# Patient Record
Sex: Female | Born: 1945
Health system: Southern US, Community
[De-identification: ages and names within clinical notes are randomized; demographics above are authoritative.]

## PROBLEM LIST (undated history)

## (undated) DIAGNOSIS — K219 Gastro-esophageal reflux disease without esophagitis: Secondary | ICD-10-CM

## (undated) DIAGNOSIS — J189 Pneumonia, unspecified organism: Secondary | ICD-10-CM

## (undated) DIAGNOSIS — T8859XA Other complications of anesthesia, initial encounter: Secondary | ICD-10-CM

## (undated) DIAGNOSIS — R0600 Dyspnea, unspecified: Secondary | ICD-10-CM

## (undated) DIAGNOSIS — R112 Nausea with vomiting, unspecified: Secondary | ICD-10-CM

## (undated) DIAGNOSIS — T4145XA Adverse effect of unspecified anesthetic, initial encounter: Secondary | ICD-10-CM

## (undated) DIAGNOSIS — F3289 Other specified depressive episodes: Secondary | ICD-10-CM

## (undated) DIAGNOSIS — J309 Allergic rhinitis, unspecified: Secondary | ICD-10-CM

## (undated) DIAGNOSIS — J449 Chronic obstructive pulmonary disease, unspecified: Secondary | ICD-10-CM

## (undated) DIAGNOSIS — M1990 Unspecified osteoarthritis, unspecified site: Secondary | ICD-10-CM

## (undated) DIAGNOSIS — I728 Aneurysm of other specified arteries: Secondary | ICD-10-CM

## (undated) DIAGNOSIS — F329 Major depressive disorder, single episode, unspecified: Secondary | ICD-10-CM

## (undated) DIAGNOSIS — I10 Essential (primary) hypertension: Secondary | ICD-10-CM

## (undated) DIAGNOSIS — E669 Obesity, unspecified: Secondary | ICD-10-CM

## (undated) DIAGNOSIS — F419 Anxiety disorder, unspecified: Secondary | ICD-10-CM

## (undated) DIAGNOSIS — Z8719 Personal history of other diseases of the digestive system: Secondary | ICD-10-CM

## (undated) DIAGNOSIS — Z9889 Other specified postprocedural states: Secondary | ICD-10-CM

## (undated) DIAGNOSIS — K5190 Ulcerative colitis, unspecified, without complications: Secondary | ICD-10-CM

## (undated) DIAGNOSIS — J45909 Unspecified asthma, uncomplicated: Secondary | ICD-10-CM

## (undated) HISTORY — DX: Major depressive disorder, single episode, unspecified: F32.9

## (undated) HISTORY — DX: Essential (primary) hypertension: I10

## (undated) HISTORY — DX: Other specified depressive episodes: F32.89

## (undated) HISTORY — DX: Gastro-esophageal reflux disease without esophagitis: K21.9

## (undated) HISTORY — DX: Aneurysm of other specified arteries: I72.8

## (undated) HISTORY — DX: Allergic rhinitis, unspecified: J30.9

## (undated) HISTORY — DX: Unspecified osteoarthritis, unspecified site: M19.90

## (undated) HISTORY — DX: Obesity, unspecified: E66.9

## (undated) HISTORY — PX: HERNIA REPAIR: SHX51

## (undated) HISTORY — PX: CATARACT EXTRACTION W/ INTRAOCULAR LENS IMPLANT: SHX1309

---
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
## 1978-01-19 HISTORY — PX: ABDOMINAL HYSTERECTOMY: SHX81

## 1983-01-20 HISTORY — PX: NASAL SEPTUM SURGERY: SHX37

## 1997-12-24 ENCOUNTER — Encounter: Payer: Self-pay | Admitting: Otolaryngology

## 1997-12-26 ENCOUNTER — Ambulatory Visit (HOSPITAL_COMMUNITY): Admission: RE | Admit: 1997-12-26 | Discharge: 1997-12-26 | Payer: Self-pay | Admitting: Otolaryngology

## 1999-05-28 ENCOUNTER — Encounter: Admission: RE | Admit: 1999-05-28 | Discharge: 1999-05-28 | Payer: Self-pay | Admitting: Internal Medicine

## 1999-05-28 ENCOUNTER — Encounter: Payer: Self-pay | Admitting: Internal Medicine

## 1999-08-22 ENCOUNTER — Encounter: Admission: RE | Admit: 1999-08-22 | Discharge: 1999-08-22 | Payer: Self-pay | Admitting: Obstetrics and Gynecology

## 1999-08-22 ENCOUNTER — Encounter: Payer: Self-pay | Admitting: Obstetrics and Gynecology

## 2000-01-20 HISTORY — PX: CHOLECYSTECTOMY: SHX55

## 2000-01-20 HISTORY — PX: LAPAROSCOPIC NISSEN FUNDOPLICATION: SHX1932

## 2000-04-28 ENCOUNTER — Encounter: Payer: Self-pay | Admitting: Internal Medicine

## 2000-04-28 ENCOUNTER — Encounter: Admission: RE | Admit: 2000-04-28 | Discharge: 2000-04-28 | Payer: Self-pay | Admitting: Internal Medicine

## 2000-07-26 ENCOUNTER — Ambulatory Visit (HOSPITAL_COMMUNITY): Admission: RE | Admit: 2000-07-26 | Discharge: 2000-07-26 | Payer: Self-pay | Admitting: Gastroenterology

## 2000-08-16 ENCOUNTER — Encounter: Admission: RE | Admit: 2000-08-16 | Discharge: 2000-08-16 | Payer: Self-pay | Admitting: Gastroenterology

## 2000-08-16 ENCOUNTER — Encounter: Payer: Self-pay | Admitting: Gastroenterology

## 2000-09-08 ENCOUNTER — Ambulatory Visit (HOSPITAL_COMMUNITY): Admission: RE | Admit: 2000-09-08 | Discharge: 2000-09-08 | Payer: Self-pay | Admitting: Gastroenterology

## 2000-09-22 ENCOUNTER — Ambulatory Visit (HOSPITAL_COMMUNITY): Admission: RE | Admit: 2000-09-22 | Discharge: 2000-09-22 | Payer: Self-pay | Admitting: Surgery

## 2000-09-22 ENCOUNTER — Encounter: Payer: Self-pay | Admitting: Surgery

## 2000-10-19 ENCOUNTER — Encounter: Payer: Self-pay | Admitting: Surgery

## 2000-10-22 ENCOUNTER — Encounter (INDEPENDENT_AMBULATORY_CARE_PROVIDER_SITE_OTHER): Payer: Self-pay | Admitting: *Deleted

## 2000-10-23 ENCOUNTER — Inpatient Hospital Stay (HOSPITAL_COMMUNITY): Admission: RE | Admit: 2000-10-23 | Discharge: 2000-10-24 | Payer: Self-pay | Admitting: Surgery

## 2001-01-19 HISTORY — PX: BREAST BIOPSY: SHX20

## 2001-02-11 ENCOUNTER — Ambulatory Visit (HOSPITAL_COMMUNITY): Admission: RE | Admit: 2001-02-11 | Discharge: 2001-02-11 | Payer: Self-pay | Admitting: Surgery

## 2001-02-11 ENCOUNTER — Encounter: Payer: Self-pay | Admitting: Surgery

## 2001-04-19 ENCOUNTER — Other Ambulatory Visit: Admission: RE | Admit: 2001-04-19 | Discharge: 2001-04-19 | Payer: Self-pay | Admitting: Obstetrics and Gynecology

## 2001-07-11 ENCOUNTER — Ambulatory Visit (HOSPITAL_COMMUNITY): Admission: RE | Admit: 2001-07-11 | Discharge: 2001-07-11 | Payer: Self-pay | Admitting: Surgery

## 2001-07-14 ENCOUNTER — Encounter: Payer: Self-pay | Admitting: Surgery

## 2001-07-14 ENCOUNTER — Ambulatory Visit (HOSPITAL_COMMUNITY): Admission: RE | Admit: 2001-07-14 | Discharge: 2001-07-14 | Payer: Self-pay

## 2002-06-07 ENCOUNTER — Other Ambulatory Visit: Admission: RE | Admit: 2002-06-07 | Discharge: 2002-06-07 | Payer: Self-pay | Admitting: Obstetrics and Gynecology

## 2003-05-23 ENCOUNTER — Encounter: Admission: RE | Admit: 2003-05-23 | Discharge: 2003-05-23 | Payer: Self-pay | Admitting: Internal Medicine

## 2003-05-23 ENCOUNTER — Encounter (INDEPENDENT_AMBULATORY_CARE_PROVIDER_SITE_OTHER): Payer: Self-pay | Admitting: *Deleted

## 2003-05-24 ENCOUNTER — Encounter: Admission: RE | Admit: 2003-05-24 | Discharge: 2003-05-24 | Payer: Self-pay | Admitting: Internal Medicine

## 2003-06-13 ENCOUNTER — Ambulatory Visit (HOSPITAL_COMMUNITY): Admission: RE | Admit: 2003-06-13 | Discharge: 2003-06-13 | Payer: Self-pay | Admitting: Internal Medicine

## 2003-08-16 ENCOUNTER — Other Ambulatory Visit: Admission: RE | Admit: 2003-08-16 | Discharge: 2003-08-16 | Payer: Self-pay | Admitting: Obstetrics and Gynecology

## 2003-12-24 ENCOUNTER — Encounter: Admission: RE | Admit: 2003-12-24 | Discharge: 2003-12-24 | Payer: Self-pay | Admitting: Internal Medicine

## 2004-06-06 ENCOUNTER — Encounter: Admission: RE | Admit: 2004-06-06 | Discharge: 2004-06-06 | Payer: Self-pay | Admitting: Internal Medicine

## 2004-12-04 ENCOUNTER — Other Ambulatory Visit: Admission: RE | Admit: 2004-12-04 | Discharge: 2004-12-04 | Payer: Self-pay | Admitting: Obstetrics and Gynecology

## 2005-02-24 ENCOUNTER — Other Ambulatory Visit: Admission: RE | Admit: 2005-02-24 | Discharge: 2005-02-24 | Payer: Self-pay | Admitting: Obstetrics and Gynecology

## 2005-06-26 ENCOUNTER — Encounter: Admission: RE | Admit: 2005-06-26 | Discharge: 2005-06-26 | Payer: Self-pay | Admitting: Internal Medicine

## 2006-08-12 ENCOUNTER — Encounter: Admission: RE | Admit: 2006-08-12 | Discharge: 2006-08-12 | Payer: Self-pay | Admitting: Internal Medicine

## 2007-08-15 ENCOUNTER — Encounter: Admission: RE | Admit: 2007-08-15 | Discharge: 2007-08-15 | Payer: Self-pay | Admitting: Internal Medicine

## 2008-08-22 ENCOUNTER — Encounter: Admission: RE | Admit: 2008-08-22 | Discharge: 2008-08-22 | Payer: Self-pay | Admitting: Internal Medicine

## 2009-09-12 ENCOUNTER — Encounter: Admission: RE | Admit: 2009-09-12 | Discharge: 2009-09-12 | Payer: Self-pay | Admitting: Internal Medicine

## 2010-01-19 HISTORY — PX: BREAST BIOPSY: SHX20

## 2010-02-08 ENCOUNTER — Encounter: Payer: Self-pay | Admitting: Obstetrics and Gynecology

## 2010-06-06 NOTE — Op Note (Signed)
[place]  Patient:    Tasha Hunt, Tasha Hunt                    MRN: [id number] Proc. Date: 07/26/00 Adm. Date:  [id number] Disc. Date: [id number] Attending:  Orland Mustard CC:         Gloris Manchester. Lazarus Salines, M.D.  Modesta Messing, M.D.  Thad Ranger, M.D   Operative Report  PROCEDURE:  A 24-hour pH probe.  INDICATIONS FOR PROCEDURE:  The patient is being considered for esophageal reflux.  She had a manometry done prior to the 24-hour probe.  The results of the 24 hour probe are as follows.  The patient had probe in place for 23 hours and 50 minutes.  The following results were obtained from the distal esophagus.  Percent time pH less than 48.5.  Total 10.4% upright and 6.8% recumbent. Total time pH less than 4.0, 122 minutes total, 72 minutes upright, and 50 minutes recumbent.  Reflux episode 152 total, 97 upright, 55 recumbent. Episodes greater than 5 minutes 2 in the recumbent position with the longest episode being 24 minutes.  Final composite score of the distal channel 46.5 minutes, normal less than 22.  ASSESSMENT:  Significant esophageal reflux.  PLAN:  Will have the patient come back to the office to discuss options. DD:  08/02/00 TD:  08/02/00 Job: [id number]

## 2010-06-06 NOTE — Procedures (Signed)
[place]  Patient:    Tasha Hunt, Tasha Hunt                    MRN: [id number] Proc. Date: 07/26/00 Adm. Date:  [id number] Disc. Date: [id number] Attending:  Orland Mustard CC:         Gloris Manchester. Lazarus Salines, M.D.  Modesta Messing, M.D.  Thad Ranger, M.D   Procedure Report  PROCEDURE:  Esophageal manometry.  INDICATIONS:  The procedure is done as part of a study for esophageal reflux, in anticipation of possible antireflux surgery.  DESCRIPTION OF PROCEDURE:  The procedure had been explained to the patient and consent obtained.  The procedure was performed in the [facility] in the usual manner.  No provocative studies were performed.  The results were as follows:  1. The upper esophageal sphincter relaxes with swallowing.  Pharyngeal spikes    are present. 2. Esophageal body mean amplitude -- 200 mmHg.  Induration 4.9 sec.  Normal    peristalsis and normal swallowing. 3. Lower esophageal sphincter mean pressure of 11 mmHg (right at lower    limits of normal); relaxes completely with swallowing.  Measured at 100%    relaxation by the computer.  ASSESSMENT:  Esophageal manometry with probable hypertensive LES.  PLAN:  Will proceed at this time with 24 hr pH probe. DD:  08/02/00 TD:  08/02/00 Job: [id number]

## 2010-06-06 NOTE — Op Note (Signed)
[facility]  Patient:    Tasha Hunt, Tasha Hunt Visit Number: [id number] MRN: [id number]          Service Type: SUR Location: 4W 0468 01 Attending Physician:  Katha Cabal Dictated by:   Thorn[place] Park Daphine Deutscher, M.D. Proc. Date: 10/22/00 Admit Date:  10/22/2000   CC:         Fayrene Fearing L. Randa Evens, M.D.  Modesta Messing, M.D.  Thad Ranger, M.D  Gloris Manchester. Lazarus Salines, M.D.   Operative Report  PREOPERATIVE DIAGNOSIS:  Gastroesophageal reflux disease and gallstones.  POSTOPERATIVE DIAGNOSIS:  Gastroesophageal reflux disease with moderate hiatal hernia and gallstones.  PROCEDURE:  Laparoscopic Nissen fundoplication and laparoscopic cholecystectomy, three suture wrap and three suture closure of hiatus.  SURGEON:  Thornton Park. Daphine Deutscher, M.D.  ASSISTANT:  Angelia Mould. Derrell Lolling, M.D.  ANESTHESIA:  General endotracheal.  DESCRIPTION OF PROCEDURE:  The patient is a 65 year old lady who was taken to room #1 at [facility] and given general anesthesia.  The abdomen was prepped with Betadine and draped sterilely.  I made a longitudinal incision above the umbilicus and entered the abdomen through a pursestring suture without difficulty, and insufflated the abdomen.  A 5 mm was placed in the upper midline.  Two 10/11s were placed in the right upper quadrant and one 10/11 in the left upper quadrant.  The liver was retracted with a 5 mm retractor.  First, I took down the gastrohepatic ligament and identified the right crus and opened that, and then mobilized the esophagus from the right side.  Next, I went over and took down the short gastrics.  I did look somewhat, and was on the look out for possible splenic artery aneurysm, but saw none.  I did have to take down numerous adhesions in the left upper quadrant from the greater curvature omentum stuck up to the anterior abdominal wall and these were taken down with the harmonic scalpel.  The short gastrics were then  taken down and the left crus was identified and mobilized, and the hiatal hernia was reduced into the abdomen.  A Penrose drain was placed behind the esophagus and used for traction to further complete the dissection of the esophagogastric junction and the hiatus.  With that in place, I then closed the hiatus using three sutures and the Endostitch.  Extracorporeal ties were placed and this snugged down the esophagus nicely.  A #50 lighted dilator was then passed easily into the stomach.  I reached around behind the esophagus and grasped a portion of the fundus where one of the short gastrics was and brought this around and a contiguous portion was used to wrap anteriorly.  Shoe shine maneuver demonstrated this continuity.  The wrap was then sutured using the Endostitch and intracorporeal ties, taking good purchases of the fundus, the esophagus, and the wrapped stomach on all three sutures.  Clips were placed on all the sutures. A large short gastric vessel which had been adequately coagulated with the harmonic was also clipped just because it was sitting up prominently on the wrapped portion of the stomach.  There was no evidence of any injury to the lumen of the esophagus or stomach.  The area was irrigated with saline and a picture taken which was placed on the chart.  The bougie was withdrawn.  There was no evidence of bleeding from the spleen.  Next, we turned our attention to the gallbladder.  The gallbladder was grasped and elevated.  She had numerous adhesions to the infundibulum  and neck, and I dissected free down Calots node to identify the gallbladder as it formed the cystic duct.  I stripped away adhesions and put a clip up on the gallbladder, and opened the cystic duct, and milked out some small black stones.  A good flow of bile flowed back at that point, and I triple clipped the cystic duct, divided it, triple clipped the cystic artery, and divided it, and then  the removed the gallbladder from the gallbladder bed without difficulty.  The gallbladder was placed in a bag and brought out through the umbilicus.  I then went ahead and irrigated and aspirated, and there was no bleeding or bile leak noted.  The trocar sites were injected with 0.5% Marcaine and the umbilical port was tied down with a pursestring suture.  Other port sites were withdrawn.  They had been placed obliquely and this seemed to close these defects spontaneously.  The wounds were closed with 4-0 Vicryl, Benzoin, and Steri-Strips.  The patient seemed to tolerate the procedure well and was taken to the recovery room in satisfactory condition. Dictated by:   Thornton Park Daphine Deutscher, M.D. Attending Physician:  Katha Cabal DD:  10/22/00 TD:  10/23/00 Job: [phone]

## 2010-06-06 NOTE — Procedures (Signed)
[facility]  Patient:    Tasha Hunt, Tasha Hunt Visit Number: [id number] MRN: [id number]          Service Type: END Location: ENDO Attending Physician:  Orland Mustard Dictated by:   Llana Aliment. Randa Evens, M.D. Proc. Date: 09/08/00 Adm. Date:  09/08/2000   CC:         Zola Button T. Lazarus Salines, M.D.  Modesta Messing, M.D.  Hoyle Barr, M.D.  Thornton Park Daphine Deutscher, M.D.   Procedure Report  PROCEDURE:  Esophagogastroduodenoscopy.  MEDICATIONS:  Hurricaine spray, Fentanyl 75 mcg, Versed 6 mg IV.  INDICATIONS FOR PROCEDURE:  Significant esophageal reflux. This is done in anticipation of possible antireflux operation.  DESCRIPTION OF PROCEDURE:  The procedure had been explained to the patient and consent obtained. With the patient in the left lateral decubitus position, the Olympus video endoscope was inserted into the esophagus with agglutination and advanced under direct visualization. The stomach was entered, pylorus identified and passed. The duodenum including the bulb and second portion was unremarkable. The scope was withdrawn back in the stomach. The antrum and body were normal. The fundus and cardia were seen well on the retroflexed view. The scope was withdrawn. The patient had a 3-4 cm hiatal hernia with a widely patulous GE junction with possibly a ring at the junction of the esophagus and stomach but widely patent. The distal esophagus was somewhat reddened consistent with reflux. The scope was withdrawn, the proximal esophagus was normal as was the hypopharynx and vocal cords. The scope was withdrawn. The patient tolerated the procedure well and was maintained on low flow oxygen and pulse oximeter throughout the procedure.  ASSESSMENT:  Hiatal hernia with esophageal ring and reflux.  PLAN:  The patient will see Dr. Daphine Deutscher for consideration for an antireflux operation. Dictated by:   Llana Aliment. Randa Evens, M.D. Attending Physician:  Orland Mustard DD:  09/08/00 TD:  09/08/00 Job: [id number]

## 2010-08-07 ENCOUNTER — Other Ambulatory Visit: Payer: Self-pay | Admitting: Internal Medicine

## 2010-08-07 DIAGNOSIS — Z1231 Encounter for screening mammogram for malignant neoplasm of breast: Secondary | ICD-10-CM

## 2010-09-29 ENCOUNTER — Ambulatory Visit
Admission: RE | Admit: 2010-09-29 | Discharge: 2010-09-29 | Disposition: A | Discharge status: Home / Self Care | Payer: Medicare Other | Source: Ambulatory Visit | Attending: Internal Medicine | Admitting: Internal Medicine

## 2010-09-29 ENCOUNTER — Other Ambulatory Visit: Payer: Self-pay | Admitting: Internal Medicine

## 2010-09-29 DIAGNOSIS — Z1231 Encounter for screening mammogram for malignant neoplasm of breast: Secondary | ICD-10-CM

## 2010-09-30 ENCOUNTER — Other Ambulatory Visit: Payer: Self-pay | Admitting: Internal Medicine

## 2010-09-30 DIAGNOSIS — R928 Other abnormal and inconclusive findings on diagnostic imaging of breast: Secondary | ICD-10-CM

## 2010-10-06 ENCOUNTER — Ambulatory Visit
Admission: RE | Admit: 2010-10-06 | Discharge: 2010-10-06 | Disposition: A | Discharge status: Home / Self Care | Payer: Medicare Other | Source: Ambulatory Visit | Attending: Internal Medicine | Admitting: Internal Medicine

## 2010-10-06 ENCOUNTER — Other Ambulatory Visit: Payer: Self-pay | Admitting: Internal Medicine

## 2010-10-06 DIAGNOSIS — R928 Other abnormal and inconclusive findings on diagnostic imaging of breast: Secondary | ICD-10-CM

## 2010-10-06 DIAGNOSIS — N6002 Solitary cyst of left breast: Secondary | ICD-10-CM

## 2010-10-10 ENCOUNTER — Ambulatory Visit
Admission: RE | Admit: 2010-10-10 | Discharge: 2010-10-10 | Disposition: A | Discharge status: Home / Self Care | Payer: Medicare Other | Source: Ambulatory Visit | Attending: Internal Medicine | Admitting: Internal Medicine

## 2010-10-10 ENCOUNTER — Other Ambulatory Visit: Payer: Self-pay | Admitting: Internal Medicine

## 2010-10-10 DIAGNOSIS — N6320 Unspecified lump in the left breast, unspecified quadrant: Secondary | ICD-10-CM

## 2010-10-10 DIAGNOSIS — N6002 Solitary cyst of left breast: Secondary | ICD-10-CM

## 2011-02-18 DIAGNOSIS — K219 Gastro-esophageal reflux disease without esophagitis: Secondary | ICD-10-CM | POA: Diagnosis not present

## 2011-02-18 DIAGNOSIS — I728 Aneurysm of other specified arteries: Secondary | ICD-10-CM | POA: Diagnosis not present

## 2011-02-18 DIAGNOSIS — I10 Essential (primary) hypertension: Secondary | ICD-10-CM | POA: Diagnosis not present

## 2011-02-18 DIAGNOSIS — R1013 Epigastric pain: Secondary | ICD-10-CM | POA: Diagnosis not present

## 2011-02-23 DIAGNOSIS — I728 Aneurysm of other specified arteries: Secondary | ICD-10-CM | POA: Diagnosis not present

## 2011-03-11 DIAGNOSIS — K219 Gastro-esophageal reflux disease without esophagitis: Secondary | ICD-10-CM | POA: Diagnosis not present

## 2011-03-11 DIAGNOSIS — I728 Aneurysm of other specified arteries: Secondary | ICD-10-CM | POA: Diagnosis not present

## 2011-03-11 DIAGNOSIS — I10 Essential (primary) hypertension: Secondary | ICD-10-CM | POA: Diagnosis not present

## 2011-03-11 DIAGNOSIS — F329 Major depressive disorder, single episode, unspecified: Secondary | ICD-10-CM | POA: Diagnosis not present

## 2011-03-11 DIAGNOSIS — M1990 Unspecified osteoarthritis, unspecified site: Secondary | ICD-10-CM | POA: Diagnosis not present

## 2011-03-18 ENCOUNTER — Encounter: Payer: Self-pay | Admitting: Vascular Surgery

## 2011-03-19 ENCOUNTER — Ambulatory Visit (INDEPENDENT_AMBULATORY_CARE_PROVIDER_SITE_OTHER): Payer: Medicare Other | Admitting: Vascular Surgery

## 2011-03-19 ENCOUNTER — Encounter (HOSPITAL_COMMUNITY): Payer: Self-pay

## 2011-03-19 ENCOUNTER — Encounter: Payer: Self-pay | Admitting: Vascular Surgery

## 2011-03-19 ENCOUNTER — Encounter: Payer: Self-pay | Admitting: *Deleted

## 2011-03-19 ENCOUNTER — Other Ambulatory Visit: Payer: Self-pay | Admitting: *Deleted

## 2011-03-19 ENCOUNTER — Other Ambulatory Visit: Payer: Medicare Other

## 2011-03-19 VITALS — BP 126/72 | HR 75 | Resp 16 | Ht 63.0 in | Wt 184.4 lb

## 2011-03-19 DIAGNOSIS — I728 Aneurysm of other specified arteries: Secondary | ICD-10-CM | POA: Diagnosis not present

## 2011-03-19 NOTE — Progress Notes (Signed)
[facility] [place] HISTORY AND PHYSICAL   History of Present Illness:  Patient is a 65 y.o. year old female who presents for evaluation of a splenic artery aneurysm. The aneurysm was found in 2003. She was originally found on evaluation for gallstones. The aneurysm was 2.3 x 1.6 x 1.6 cm at that time. The patient denies any abdominal or left upper quadrant pain recently. She has had a previous Nissen fundoplication and cholecystectomy..  Other medical problems include hypertension, reflux, arthritis.  These are currently controlled and followed by her primary care physician. She does have family history of abdominal aortic aneurysm. .   Past Medical History  Diagnosis Date  . Allergic rhinitis, cause unspecified   . Hypertension   . Esophageal reflux   . Obesity, unspecified   . Depressive disorder, not elsewhere classified   . Aneurysm of splenic artery     noted since 2002  . Arthritis     hands  . Diaphragmatic hernia without mention of obstruction or gangrene     Past Surgical History  Procedure Date  . Cholecystectomy 2002  . Abdominal hysterectomy   . Hiatal hernia repair     laparoscopic repair  . Nasal septum surgery 1985    deviated septum     Social History History  Substance Use Topics  . Smoking status: Never Smoker   . Smokeless tobacco: Not on file  . Alcohol Use: No    Family History Family History  Problem Relation Age of Onset  . Cancer Mother     melanoma  . Diabetes Mother   . Heart disease Mother   . Hyperlipidemia Mother   . Diabetes Father   . Hypertension Father   . Cancer Father     prostate  . Heart disease Father   . Heart attack Father   . Cancer Paternal Aunt   . Aneurysm Paternal Aunt     AAA  . Cancer Paternal Grandmother   . Aneurysm Paternal Uncle     AAA    Allergies  No Known Allergies   Current Outpatient Prescriptions  Medication Sig Dispense Refill  . ALPRAZolam (XANAX) 0.25 MG tablet Take  0.5 mg by mouth every 4 (four) hours as needed. For anxiety      . BIOTIN PO Take 1 capsule by mouth daily.       . calcium carbonate (OS-CAL) 600 MG TABS Take 600 mg by mouth 2 (two) times daily with a meal.      . carvedilol (COREG) 12.5 MG tablet Take 12.5 mg by mouth 2 (two) times daily with a meal.      . cetirizine (ZYRTEC) 10 MG tablet Take 10 mg by mouth daily.      . Cholecalciferol (VITAMIN D3) 2000 UNITS TABS Take 1 capsule by mouth daily.       . diltiazem (CARDIZEM) 90 MG tablet Take 90 mg by mouth 2 (two) times daily.      . fish oil-omega-3 fatty acids 1000 MG capsule Take 1 g by mouth daily.      . fluticasone (FLONASE) 50 MCG/ACT nasal spray Place 2 sprays into the nose daily.      . Glucosamine-MSM-Hyaluronic Acd 750-375-30 MG TABS Take 3 capsules by mouth daily.       . Naproxen-Esomeprazole (VIMOVO) 500-20 MG TBEC Take 1 tablet by mouth 2 (two) times daily as needed. For osteoarthritis      . pantoprazole (PROTONIX) 40 MG tablet Take 40 mg   by mouth daily.      . FLUoxetine (PROZAC) 10 MG capsule Take 10 mg by mouth daily.        ROS:   General:  No weight loss, Fever, chills  HEENT: No recent headaches, no nasal bleeding, no visual changes, no sore throat  Neurologic: No dizziness, blackouts, seizures. No recent symptoms of stroke or mini- stroke. No recent episodes of slurred speech, or temporary blindness.  Cardiac: No recent episodes of chest pain/pressure, no shortness of breath at rest.  No shortness of breath with exertion.  Denies history of atrial fibrillation or irregular heartbeat  Vascular: No history of rest pain in feet.  No history of claudication.  No history of non-healing ulcer, No history of DVT   Pulmonary: No home oxygen, no productive cough, no hemoptysis,  No asthma or wheezing  Musculoskeletal:  [ ] Arthritis, [ ] Low back pain,  [ ] Joint pain  Hematologic:No history of hypercoagulable state.  No history of easy bleeding.  No history of  anemia  Gastrointestinal: No hematochezia or melena,  No gastroesophageal reflux, no trouble swallowing  Urinary: [ ] chronic Kidney disease, [ ] on HD - [ ] MWF or [ ] TTHS, [ ] Burning with urination, [ ] Frequent urination, [ ] Difficulty urinating;   Skin: No rashes  Psychological: No history of anxiety,  No history of depression   Physical Examination  Filed Vitals:   03/19/11 1212  BP: 126/72  Pulse: 75  Resp: 16  Height: 5' 3" (1.6 m)  Weight: 184 lb 6.4 oz (83.643 kg)    Body mass index is 32.66 kg/(m^2).  General:  Alert and oriented, no acute distress HEENT: Normal Neck: No bruit or JVD Pulmonary: Clear to auscultation bilaterally Cardiac: Regular Rate and Rhythm without murmur Abdomen: Soft, non-tender, non-distended, no mass Skin: No rash Extremity Pulses:  2+ radial, brachial, femoral, dorsalis pedis, posterior tibial pulses bilaterally Musculoskeletal: No deformity or edema  Neurologic: Upper and lower extremity motor 5/5 and symmetric  DATA: I reviewed the images from her CT scan from [facility] [place]. The scan is dated 02/23/2011. This shows a calcified splenic artery aneurysm which measures 2.6 x 3.2 cm. There is no evidence of abdominal aortic aneurysm.   ASSESSMENT: Splenic artery aneurysm which has now grown to greater than 3 cm.   PLAN:  Arteriogram with possible coil embolization: [facility] 2013. Risks benefits possible complications and procedure details of the angiogram were explained the patient today as well as a coil embolization. Since she has previously had Nissen fundoplication she may not have short gastrics to give reasonable blood supply to the spleen. This may result in a splenic infarct at the time of embolization. However I believe an open operation would be difficult as well because of her previous upper abdominal surgery. Also the location of the aneurysm in open repair would put her at high risk for splenectomy.  Demareon Coldwell, MD [facility] [place] Office: [phone] Pager: [phone]  

## 2011-03-27 ENCOUNTER — Inpatient Hospital Stay (HOSPITAL_COMMUNITY)
Admission: RE | Admit: 2011-03-27 | Discharge: 2011-03-29 | DRG: 238 | Disposition: A | Discharge status: Home / Self Care | Payer: Medicare Other | Source: Ambulatory Visit | Attending: Vascular Surgery | Admitting: Vascular Surgery

## 2011-03-27 ENCOUNTER — Encounter (HOSPITAL_COMMUNITY)
Admission: RE | Disposition: A | Discharge status: Home / Self Care | Payer: Self-pay | Source: Ambulatory Visit | Attending: Vascular Surgery

## 2011-03-27 ENCOUNTER — Other Ambulatory Visit: Payer: Self-pay | Admitting: *Deleted

## 2011-03-27 ENCOUNTER — Other Ambulatory Visit: Payer: Self-pay

## 2011-03-27 ENCOUNTER — Encounter (HOSPITAL_COMMUNITY): Payer: Self-pay | Admitting: General Practice

## 2011-03-27 DIAGNOSIS — F329 Major depressive disorder, single episode, unspecified: Secondary | ICD-10-CM | POA: Diagnosis present

## 2011-03-27 DIAGNOSIS — Z833 Family history of diabetes mellitus: Secondary | ICD-10-CM

## 2011-03-27 DIAGNOSIS — I10 Essential (primary) hypertension: Secondary | ICD-10-CM | POA: Diagnosis not present

## 2011-03-27 DIAGNOSIS — Z808 Family history of malignant neoplasm of other organs or systems: Secondary | ICD-10-CM | POA: Diagnosis not present

## 2011-03-27 DIAGNOSIS — Z6832 Body mass index (BMI) 32.0-32.9, adult: Secondary | ICD-10-CM | POA: Diagnosis not present

## 2011-03-27 DIAGNOSIS — I728 Aneurysm of other specified arteries: Principal | ICD-10-CM | POA: Diagnosis present

## 2011-03-27 DIAGNOSIS — Z90710 Acquired absence of both cervix and uterus: Secondary | ICD-10-CM | POA: Diagnosis not present

## 2011-03-27 DIAGNOSIS — Z8249 Family history of ischemic heart disease and other diseases of the circulatory system: Secondary | ICD-10-CM | POA: Diagnosis not present

## 2011-03-27 DIAGNOSIS — F3289 Other specified depressive episodes: Secondary | ICD-10-CM | POA: Diagnosis present

## 2011-03-27 DIAGNOSIS — K219 Gastro-esophageal reflux disease without esophagitis: Secondary | ICD-10-CM | POA: Diagnosis present

## 2011-03-27 DIAGNOSIS — E669 Obesity, unspecified: Secondary | ICD-10-CM | POA: Diagnosis present

## 2011-03-27 HISTORY — DX: Personal history of other diseases of the digestive system: Z87.19

## 2011-03-27 HISTORY — PX: EMBOLIZATION: SHX5507

## 2011-03-27 HISTORY — PX: ARTERIAL ANEURYSM REPAIR: SHX556

## 2011-03-27 HISTORY — DX: Anxiety disorder, unspecified: F41.9

## 2011-03-27 HISTORY — DX: Pneumonia, unspecified organism: J18.9

## 2011-03-27 LAB — POCT I-STAT, CHEM 8
Calcium, Ion: 1.12 mmol/L (ref 1.12–1.32)
Chloride: 106 mEq/L (ref 96–112)
Glucose, Bld: 103 mg/dL — ABNORMAL HIGH (ref 70–99)
HCT: 35 % — ABNORMAL LOW (ref 36.0–46.0)
Hemoglobin: 11.9 g/dL — ABNORMAL LOW (ref 12.0–15.0)
TCO2: 34 mmol/L (ref 0–100)

## 2011-03-27 SURGERY — EMBOLIZATION
Anesthesia: LOCAL

## 2011-03-27 MED ORDER — FAMOTIDINE IN NACL 20-0.9 MG/50ML-% IV SOLN
20.0000 mg | Freq: Two times a day (BID) | INTRAVENOUS | Status: DC
  Administered 2011-03-27 – 2011-03-28 (×3): 20 mg via INTRAVENOUS
  Filled 2011-03-27 (×4): qty 50

## 2011-03-27 MED ORDER — SODIUM CHLORIDE 0.45 % IV SOLN
INTRAVENOUS | Status: DC
  Administered 2011-03-27: 11:00:00 via INTRAVENOUS

## 2011-03-27 MED ORDER — HEPARIN (PORCINE) IN NACL 2-0.9 UNIT/ML-% IJ SOLN
INTRAMUSCULAR | Status: AC
  Filled 2011-03-27: qty 1000

## 2011-03-27 MED ORDER — LIDOCAINE HCL (PF) 1 % IJ SOLN
INTRAMUSCULAR | Status: AC
  Filled 2011-03-27: qty 30

## 2011-03-27 MED ORDER — NAPROXEN-ESOMEPRAZOLE 500-20 MG PO TBEC: 1.0000 | DELAYED_RELEASE_TABLET | Freq: Two times a day (BID) | ORAL | Status: DC | PRN

## 2011-03-27 MED ORDER — SODIUM CHLORIDE 0.9 % IV SOLN
INTRAVENOUS | Status: DC
  Administered 2011-03-27 – 2011-03-29 (×2): via INTRAVENOUS

## 2011-03-27 MED ORDER — MIDAZOLAM HCL 2 MG/2ML IJ SOLN
INTRAMUSCULAR | Status: AC
  Filled 2011-03-27: qty 2

## 2011-03-27 MED ORDER — LORATADINE 10 MG PO TABS
10.0000 mg | ORAL_TABLET | Freq: Every day | ORAL | Status: DC
  Administered 2011-03-28 – 2011-03-29 (×2): 10 mg via ORAL
  Filled 2011-03-27 (×3): qty 1

## 2011-03-27 MED ORDER — OXYCODONE HCL 5 MG PO TABS
5.0000 mg | ORAL_TABLET | ORAL | Status: DC | PRN
  Administered 2011-03-27: 10 mg via ORAL
  Filled 2011-03-27: qty 2

## 2011-03-27 MED ORDER — ALPRAZOLAM 0.5 MG PO TABS
0.5000 mg | ORAL_TABLET | ORAL | Status: DC | PRN
  Administered 2011-03-28: 0.5 mg via ORAL
  Filled 2011-03-27: qty 1

## 2011-03-27 MED ORDER — GUAIFENESIN-DM 100-10 MG/5ML PO SYRP: 15.0000 mL | ORAL_SOLUTION | ORAL | Status: DC | PRN

## 2011-03-27 MED ORDER — HYDRALAZINE HCL 20 MG/ML IJ SOLN
10.0000 mg | INTRAMUSCULAR | Status: DC | PRN
  Filled 2011-03-27: qty 0.5

## 2011-03-27 MED ORDER — FLUOXETINE HCL 10 MG PO CAPS
10.0000 mg | ORAL_CAPSULE | Freq: Every day | ORAL | Status: DC
  Administered 2011-03-27 – 2011-03-29 (×3): 10 mg via ORAL
  Filled 2011-03-27 (×3): qty 1

## 2011-03-27 MED ORDER — CALCIUM CARBONATE 600 MG PO TABS
600.0000 mg | ORAL_TABLET | Freq: Two times a day (BID) | ORAL | Status: DC
  Filled 2011-03-27 (×2): qty 1

## 2011-03-27 MED ORDER — SODIUM CHLORIDE 0.9 % IV SOLN: 500.0000 mL | Freq: Once | INTRAVENOUS | Status: AC | PRN

## 2011-03-27 MED ORDER — PHENOL 1.4 % MT LIQD
1.0000 | OROMUCOSAL | Status: DC | PRN
  Filled 2011-03-27: qty 177

## 2011-03-27 MED ORDER — PANTOPRAZOLE SODIUM 40 MG PO TBEC
40.0000 mg | DELAYED_RELEASE_TABLET | Freq: Every day | ORAL | Status: DC
  Administered 2011-03-27 – 2011-03-29 (×3): 40 mg via ORAL
  Filled 2011-03-27 (×3): qty 1

## 2011-03-27 MED ORDER — DILTIAZEM HCL 90 MG PO TABS
90.0000 mg | ORAL_TABLET | Freq: Two times a day (BID) | ORAL | Status: DC
  Administered 2011-03-27 – 2011-03-29 (×4): 90 mg via ORAL
  Filled 2011-03-27 (×6): qty 1

## 2011-03-27 MED ORDER — METOPROLOL TARTRATE 1 MG/ML IV SOLN: 2.0000 mg | INTRAVENOUS | Status: DC | PRN

## 2011-03-27 MED ORDER — ACETAMINOPHEN 325 MG PO TABS: 325.0000 mg | ORAL_TABLET | ORAL | Status: DC | PRN

## 2011-03-27 MED ORDER — FENTANYL CITRATE 0.05 MG/ML IJ SOLN
INTRAMUSCULAR | Status: AC
  Filled 2011-03-27: qty 2

## 2011-03-27 MED ORDER — CALCIUM CARBONATE 1250 (500 CA) MG PO TABS
500.0000 mg | ORAL_TABLET | Freq: Two times a day (BID) | ORAL | Status: DC
  Administered 2011-03-27 – 2011-03-29 (×4): 500 mg via ORAL
  Filled 2011-03-27 (×7): qty 1

## 2011-03-27 MED ORDER — CARVEDILOL 12.5 MG PO TABS
12.5000 mg | ORAL_TABLET | Freq: Two times a day (BID) | ORAL | Status: DC
  Administered 2011-03-27 – 2011-03-29 (×4): 12.5 mg via ORAL
  Filled 2011-03-27 (×6): qty 1

## 2011-03-27 MED ORDER — DOCUSATE SODIUM 100 MG PO CAPS
100.0000 mg | ORAL_CAPSULE | Freq: Every day | ORAL | Status: DC
  Administered 2011-03-28 – 2011-03-29 (×2): 100 mg via ORAL
  Filled 2011-03-27 (×2): qty 1

## 2011-03-27 MED ORDER — MAGNESIUM SULFATE 40 MG/ML IJ SOLN
2.0000 g | Freq: Once | INTRAMUSCULAR | Status: AC | PRN
  Filled 2011-03-27: qty 50

## 2011-03-27 MED ORDER — ACETAMINOPHEN 650 MG RE SUPP: 325.0000 mg | RECTAL | Status: DC | PRN

## 2011-03-27 MED ORDER — MORPHINE SULFATE 2 MG/ML IJ SOLN
2.0000 mg | INTRAMUSCULAR | Status: DC | PRN
  Administered 2011-03-27: 2 mg via INTRAVENOUS
  Administered 2011-03-27 (×2): 4 mg via INTRAVENOUS
  Filled 2011-03-27 (×2): qty 2
  Filled 2011-03-27: qty 1

## 2011-03-27 MED ORDER — ONDANSETRON HCL 4 MG/2ML IJ SOLN
4.0000 mg | Freq: Four times a day (QID) | INTRAMUSCULAR | Status: DC | PRN
  Administered 2011-03-27 – 2011-03-28 (×3): 4 mg via INTRAVENOUS
  Filled 2011-03-27 (×4): qty 2

## 2011-03-27 MED ORDER — LABETALOL HCL 5 MG/ML IV SOLN
10.0000 mg | INTRAVENOUS | Status: DC | PRN
  Filled 2011-03-27: qty 4

## 2011-03-27 MED ORDER — POTASSIUM CHLORIDE CRYS ER 20 MEQ PO TBCR: 20.0000 meq | EXTENDED_RELEASE_TABLET | Freq: Once | ORAL | Status: AC | PRN

## 2011-03-27 NOTE — Op Note (Signed)
Procedure: Aortogram with coil embolization of splenic artery  Preoperative diagnosis: Splenic artery aneurysm  Postoperative diagnosis: Same  Anesthesia Local with IV sedation  Operative details: After obtaining informed consent, the patient was taken to the PV LAB. The patient was placed in supine position on the Angio table. Both groins were prepped and draped in usual sterile fashion. Local anesthesia was infiltrated over the right common femoral artery.  An introducer needle was used to cannulate the right common femoral artery and 035 Bentsen wire threaded into the abdominal aorta under fluoroscopic guidance. Next a 5 French sheath is placed over the guidewire in the right common femoral artery. A 5 French pigtail catheter was placed over the guidewire into the abdominal aorta and abdominal aortogram was obtained. The infrarenal abdominal aorta is patent. The left and right common internal and external iliac arteries are patent. The celiac, right and left renal and superior mesenteric arteries are patent.  This was confirmed on a lateral aortic view.  Next, a 5 Fr Motarjeme catheter was brought up on the field and the pigtail catheter exchanged over a wire for this.  The Motarjeme catheter was then used to selectively catheterize the celiac origin.  Selective injection of the celiac axis was performed to determine the celiac anatomy which had normal position of the common hepatic splenic and gastric artery. The splenic artery aneurysm was identified. The [facility] wire was advanced through the catheter distally into the splenic artery out towards the area aneurysm. The Motarjeme catheter was then removed over a guidewire and exchanged for a 4 French straight catheter. This was advanced over the guidewire up into the distal portion of the splenic artery. The [facility] wire was then exchanged for an 035 first core wire. This was then advanced without difficulty out into the splenic artery aneurysm. The 4  French catheter was then exchanged for a 5 French Navicross catheter.  I then proceeded to advance the versacore wire out past the splenic artery aneurysm into the more distal splenic artery. I then proceeded to place several Nester coils in the splenic artery distal to the aneurysm. During the course of this portion of the procedure the catheter slipped back slightly and one coil was inadvertently placed up into a splenic artery branch proximal to the aneurysm. After completely filling the artery distal to the aneurysm for several centimeters, a dimpled catheter back slightly and filled the entire aneurysm with several Adriana Simas and Nester coils. The catheter was then again pulled back slightly and several coils were deployed into the splenic artery proximal to the aneurysm. Contrast angiogram was performed in the splenic artery which showed essentially no flow distal to the aneurysm or into the aneurysm and no flow into the proximal artery feeding the aneurysm. The coil that had gone out into the additional splenic artery branch had occluded this branch distally but there was some proximal flow in the branch and some flow into the spleen itself. The Navicross catheter was then pulled back over a guidewire and the Motarjeme a catheter again placed over the guidewire to selectively catheterize the celiac artery. Completion arteriogram was obtained in the AP and lateral projection which showed the remainder of the celiac axis and intact with some late filling of the spleen. There was no flow into the splenic artery aneurysm at this point. The catheter and guidewire were then removed. The 5 French sheath was removed and hemostasis obtained with direct pressure.  The patient tolerated the procedure well and there were no  complications except for the deployment of this one coil into a splenic artery branch with no obvious problems.   Patient was taken to the holding area in stable condition.  Operative  findings: Successful coil embolization of splenic artery aneurysm    Fabienne Bruns, MD [facility] [place] Office: [phone] Pager: [phone]

## 2011-03-27 NOTE — H&P (View-Only) (Signed)
[facility] [place] HISTORY AND PHYSICAL   History of Present Illness:  Patient is a 66 y.o. year old female who presents for evaluation of a splenic artery aneurysm. The aneurysm was found in 2003. She was originally found on evaluation for gallstones. The aneurysm was 2.3 x 1.6 x 1.6 cm at that time. The patient denies any abdominal or left upper quadrant pain recently. She has had a previous Nissen fundoplication and cholecystectomy..  Other medical problems include hypertension, reflux, arthritis.  These are currently controlled and followed by her primary care physician. She does have family history of abdominal aortic aneurysm. Marland Kitchen   Past Medical History  Diagnosis Date  . Allergic rhinitis, cause unspecified   . Hypertension   . Esophageal reflux   . Obesity, unspecified   . Depressive disorder, not elsewhere classified   . Aneurysm of splenic artery     noted since 2002  . Arthritis     hands  . Diaphragmatic hernia without mention of obstruction or gangrene     Past Surgical History  Procedure Date  . Cholecystectomy 2002  . Abdominal hysterectomy   . Hiatal hernia repair     laparoscopic repair  . Nasal septum surgery 1985    deviated septum     Social History History  Substance Use Topics  . Smoking status: Never Smoker   . Smokeless tobacco: Not on file  . Alcohol Use: No    Family History Family History  Problem Relation Age of Onset  . Cancer Mother     melanoma  . Diabetes Mother   . Heart disease Mother   . Hyperlipidemia Mother   . Diabetes Father   . Hypertension Father   . Cancer Father     prostate  . Heart disease Father   . Heart attack Father   . Cancer Paternal Aunt   . Aneurysm Paternal Aunt     AAA  . Cancer Paternal Grandmother   . Aneurysm Paternal Uncle     AAA    Allergies  No Known Allergies   Current Outpatient Prescriptions  Medication Sig Dispense Refill  . ALPRAZolam (XANAX) 0.25 MG tablet Take  0.5 mg by mouth every 4 (four) hours as needed. For anxiety      . BIOTIN PO Take 1 capsule by mouth daily.       . calcium carbonate (OS-CAL) 600 MG TABS Take 600 mg by mouth 2 (two) times daily with a meal.      . carvedilol (COREG) 12.5 MG tablet Take 12.5 mg by mouth 2 (two) times daily with a meal.      . cetirizine (ZYRTEC) 10 MG tablet Take 10 mg by mouth daily.      . Cholecalciferol (VITAMIN D3) 2000 UNITS TABS Take 1 capsule by mouth daily.       Marland Kitchen diltiazem (CARDIZEM) 90 MG tablet Take 90 mg by mouth 2 (two) times daily.      . fish oil-omega-3 fatty acids 1000 MG capsule Take 1 g by mouth daily.      . fluticasone (FLONASE) 50 MCG/ACT nasal spray Place 2 sprays into the nose daily.      . Glucosamine-MSM-Hyaluronic Acd 750-375-30 MG TABS Take 3 capsules by mouth daily.       . Naproxen-Esomeprazole (VIMOVO) 500-20 MG TBEC Take 1 tablet by mouth 2 (two) times daily as needed. For osteoarthritis      . pantoprazole (PROTONIX) 40 MG tablet Take 40 mg  by mouth daily.      Marland Kitchen FLUoxetine (PROZAC) 10 MG capsule Take 10 mg by mouth daily.        ROS:   General:  No weight loss, Fever, chills  HEENT: No recent headaches, no nasal bleeding, no visual changes, no sore throat  Neurologic: No dizziness, blackouts, seizures. No recent symptoms of stroke or mini- stroke. No recent episodes of slurred speech, or temporary blindness.  Cardiac: No recent episodes of chest pain/pressure, no shortness of breath at rest.  No shortness of breath with exertion.  Denies history of atrial fibrillation or irregular heartbeat  Vascular: No history of rest pain in feet.  No history of claudication.  No history of non-healing ulcer, No history of DVT   Pulmonary: No home oxygen, no productive cough, no hemoptysis,  No asthma or wheezing  Musculoskeletal:  [ ]  Arthritis, [ ]  Low back pain,  [ ]  Joint pain  Hematologic:No history of hypercoagulable state.  No history of easy bleeding.  No history of  anemia  Gastrointestinal: No hematochezia or melena,  No gastroesophageal reflux, no trouble swallowing  Urinary: [ ]  chronic Kidney disease, [ ]  on HD - [ ]  MWF or [ ]  TTHS, [ ]  Burning with urination, [ ]  Frequent urination, [ ]  Difficulty urinating;   Skin: No rashes  Psychological: No history of anxiety,  No history of depression   Physical Examination  Filed Vitals:   03/19/11 1212  BP: 126/72  Pulse: 75  Resp: 16  Height: 5\' 3"  (1.6 m)  Weight: 184 lb 6.4 oz (83.643 kg)    Body mass index is 32.66 kg/(m^2).  General:  Alert and oriented, no acute distress HEENT: Normal Neck: No bruit or JVD Pulmonary: Clear to auscultation bilaterally Cardiac: Regular Rate and Rhythm without murmur Abdomen: Soft, non-tender, non-distended, no mass Skin: No rash Extremity Pulses:  2+ radial, brachial, femoral, dorsalis pedis, posterior tibial pulses bilaterally Musculoskeletal: No deformity or edema  Neurologic: Upper and lower extremity motor 5/5 and symmetric  DATA: I reviewed the images from her CT scan from [facility] [place]. The scan is dated 02/23/2011. This shows a calcified splenic artery aneurysm which measures 2.6 x 3.2 cm. There is no evidence of abdominal aortic aneurysm.   ASSESSMENT: Splenic artery aneurysm which has now grown to greater than 3 cm.   PLAN:  Arteriogram with possible coil embolization: [facility]. Risks benefits possible complications and procedure details of the angiogram were explained the patient today as well as a coil embolization. Since she has previously had Nissen fundoplication she may not have short gastrics to give reasonable blood supply to the spleen. This may result in a splenic infarct at the time of embolization. However I believe an open operation would be difficult as well because of her previous upper abdominal surgery. Also the location of the aneurysm in open repair would put her at high risk for splenectomy.  Fabienne Bruns, MD [facility] [place] Office: [phone] Pager: [phone]

## 2011-03-27 NOTE — Interval H&P Note (Signed)
History and Physical Interval Note:  03/27/2011 8:32 AM  Tasha Hunt  has presented today for surgery, with the diagnosis of splenic artery ano  The various methods of treatment have been discussed with the patient and family. After consideration of risks, benefits and other options for treatment, the patient has consented to  Procedure(s) (LRB): EMBOLIZATION (N/A) as a surgical intervention .  The patients' history has been reviewed, patient examined, no change in status, stable for surgery.  I have reviewed the patients' chart and labs.  Questions were answered to the patient's satisfaction.     [name]

## 2011-03-28 LAB — BASIC METABOLIC PANEL
Calcium: 8.7 mg/dL (ref 8.4–10.5)
Creatinine, Ser: 0.45 mg/dL — ABNORMAL LOW (ref 0.50–1.10)
GFR calc Af Amer: >90 mL/min (ref >90)
GFR calc non Af Amer: >90 mL/min (ref >90)
Sodium: 136 mEq/L (ref 135–145)

## 2011-03-28 LAB — CBC
HCT: 37.9 % (ref 36.0–46.0)
Hemoglobin: 12.5 g/dL (ref 12.0–15.0)
MCH: 28.6 pg (ref 26.0–34.0)
MCHC: 33 g/dL (ref 30.0–36.0)
MCV: 86.7 fL (ref 78.0–100.0)
Platelets: 192 10*3/uL (ref 150–400)
RBC: 4.37 MIL/uL (ref 3.87–5.11)
RDW: 16.2 % — ABNORMAL HIGH (ref 11.5–15.5)
WBC: 19.3 10*3/uL — ABNORMAL HIGH (ref 4.0–10.5)

## 2011-03-28 MED ORDER — OXYCODONE HCL 5 MG PO TABS: 5.0000 mg | ORAL_TABLET | ORAL | Status: AC | PRN

## 2011-03-28 MED ORDER — ACETAMINOPHEN 325 MG PO TABS
650.0000 mg | ORAL_TABLET | Freq: Four times a day (QID) | ORAL | Status: DC | PRN
  Administered 2011-03-28 – 2011-03-29 (×3): 650 mg via ORAL
  Filled 2011-03-28: qty 2
  Filled 2011-03-28: qty 1
  Filled 2011-03-28: qty 2

## 2011-03-28 MED ORDER — PROMETHAZINE HCL 25 MG/ML IJ SOLN: 12.5000 mg | Freq: Four times a day (QID) | INTRAMUSCULAR | Status: DC | PRN

## 2011-03-28 MED ORDER — ONDANSETRON HCL 4 MG/2ML IJ SOLN: 4.0000 mg | Freq: Four times a day (QID) | INTRAMUSCULAR | Status: DC

## 2011-03-28 NOTE — Discharge Summary (Signed)
[facility] Discharge Summary  Tasha Hunt 12-Dec-1945 66 y.o. female  [id number]  Admission Date: 03/27/2011  Discharge Date: 31013  Physician: Sherren Kerns, MD  Admission Diagnosis: splenic artery ano   HPI:   This is a 66 y.o. female who presents for evaluation of a splenic artery aneurysm. The aneurysm was found in 2003. She was originally found on evaluation for gallstones. The aneurysm was 2.3 x 1.6 x 1.6 cm at that time. The patient denies any abdominal or left upper quadrant pain recently. She has had a previous Nissen fundoplication and cholecystectomy.. Other medical problems include hypertension, reflux, arthritis. These are currently controlled and followed by her primary care physician. She does have family history of abdominal aortic aneurysm.   Hospital Course:  The patient was admitted to the hospital and taken to the [facility] on 03/27/2011 and underwent Aortogram with coil embolization of splenic artery.  The pt tolerated the procedure well and was transported to the PACU in good condition. By POD 1, she was having some nausea and vomiting.  She did eat breakfast and tolerated that.  By late that afternoon, her nausea was much improved.  She was d/c'd home the next day.  The remainder of the hospital course consisted of increasing ambulation and increasing intake of solids without difficulty.  CBC    Component Value Date/Time   WBC 19.3* 03/28/2011 0700   RBC 4.37 03/28/2011 0700   HGB 12.5 03/28/2011 0700   HCT 37.9 03/28/2011 0700   PLT 192 03/28/2011 0700   MCV 86.7 03/28/2011 0700   MCH 28.6 03/28/2011 0700   MCHC 33.0 03/28/2011 0700   RDW 16.2* 03/28/2011 0700    BMET    Component Value Date/Time   NA 136 03/28/2011 0700   K 3.8 03/28/2011 0700   CL 99 03/28/2011 0700   CO2 26 03/28/2011 0700   GLUCOSE 162* 03/28/2011 0700   BUN 9 03/28/2011 0700   CREATININE 0.45* 03/28/2011 0700   CALCIUM 8.7 03/28/2011 0700   GFRNONAA >90 03/28/2011 0700   GFRAA >90  03/28/2011 0700     Discharge Instructions:   The patient is discharged to home with extensive instructions on wound care and progressive ambulation.  They are instructed not to drive or perform any heavy lifting until returning to see the physician in his office.  Discharge Orders    Future Appointments: Provider: Department: Dept Phone: Center:   05/07/2011 1:00 PM Sherren Kerns, MD [facility] [phone] [facility]     Future Orders Please Complete By Expires   Resume previous diet      Driving Restrictions      Comments:   No driving for a day and while taking pain medication   Lifting restrictions      Comments:   No lifting for 6 weeks   Call MD for:  temperature >100.5      Call MD for:  redness, tenderness, or signs of infection (pain, swelling, bleeding, redness, odor or green/yellow discharge around incision site)      Call MD for:  severe or increased pain, loss or decreased feeling  in affected limb(s)      may wash over wound with mild soap and water      Scheduling Instructions:   Shower daily with soap and water starting 03/28/11       Discharge Diagnosis:  splenic artery ano  Secondary Diagnosis: Patient Active Problem List  Diagnoses  . Aneurysm of splenic artery  Past Medical History  Diagnosis Date  . Allergic rhinitis, cause unspecified   . Hypertension   . Esophageal reflux   . Obesity, unspecified   . Depressive disorder, not elsewhere classified   . Aneurysm of splenic artery     noted since 2002  . Arthritis     hands  . Pneumonia     03/27/11 "many many years ago"  . H/O hiatal hernia   . Anxiety     Tasha Hunt, [name]  Home Medication Instructions [id number]   Printed on:03/28/11 0859  Medication Information                    carvedilol (COREG) 12.5 MG tablet Take 12.5 mg by mouth 2 (two) times daily with a meal.           ALPRAZolam (XANAX) 0.25 MG tablet Take 0.5 mg by mouth every 4 (four) hours as needed. For anxiety             fish oil-omega-3 fatty acids 1000 MG capsule Take 1 g by mouth daily.           cetirizine (ZYRTEC) 10 MG tablet Take 10 mg by mouth daily.           Cholecalciferol (VITAMIN D3) 2000 UNITS TABS Take 1 capsule by mouth daily.            calcium carbonate (OS-CAL) 600 MG TABS Take 600 mg by mouth 2 (two) times daily with a meal.           diltiazem (CARDIZEM) 90 MG tablet Take 90 mg by mouth 2 (two) times daily.           Naproxen-Esomeprazole (VIMOVO) 500-20 MG TBEC Take 1 tablet by mouth 2 (two) times daily as needed. For osteoarthritis           fluticasone (FLONASE) 50 MCG/ACT nasal spray Place 2 sprays into the nose daily.           pantoprazole (PROTONIX) 40 MG tablet Take 40 mg by mouth daily.           BIOTIN PO Take 1 capsule by mouth daily.            Glucosamine-MSM-Hyaluronic Acd 750-375-30 MG TABS Take 3 capsules by mouth daily.            FLUoxetine (PROZAC) 10 MG capsule Take 10 mg by mouth daily.           oxyCODONE (OXY IR/ROXICODONE) 5 MG immediate release tablet Take 1 tablet (5 mg total) by mouth every 4 (four) hours as needed. #30 NR             Disposition: home  Patient's condition: is Good  Follow up: 1. Dr. Darrick Penna in 4 weeks   Newton Pigg, PA-C [facility] [phone] 03/28/2011  8:59 AM

## 2011-03-28 NOTE — Progress Notes (Addendum)
[facility] Progress Note  03/28/2011 8:40 AM POD 1  Subjective:  Nauseated this am.  Was vomiting clear liquid, but now dry heaving.  Feels better after vomiting.  States that she has had hiatal hernia repair in 2002 for GERD.  She states that she has eaten breakfast this am and so far tolerating it.  Afebrile x 24hrs 110-130s sys  HR 50s-70s reg  94%RA Filed Vitals:   03/28/11 0457  BP: 107/68  Pulse: 70  Temp: 98.4 F (36.9 C)  Resp: 16    Physical Exam: Incisions:  Right groin is c/d/i with some ecchymosis.  No hematoma. Extremities: 3+ palpable DP pulses bilaterally Abdomen:  Soft/NT/ND minimal BS.  +N/V overnight. Cardiac:  RRR Lungs:  CTAB  CBC    Component Value Date/Time   WBC 19.3* 03/28/2011 0700   RBC 4.37 03/28/2011 0700   HGB 12.5 03/28/2011 0700   HCT 37.9 03/28/2011 0700   PLT 192 03/28/2011 0700   MCV 86.7 03/28/2011 0700   MCH 28.6 03/28/2011 0700   MCHC 33.0 03/28/2011 0700   RDW 16.2* 03/28/2011 0700    BMET    Component Value Date/Time   NA 136 03/28/2011 0700   K 3.8 03/28/2011 0700   CL 99 03/28/2011 0700   CO2 26 03/28/2011 0700   GLUCOSE 162* 03/28/2011 0700   BUN 9 03/28/2011 0700   CREATININE 0.45* 03/28/2011 0700   CALCIUM 8.7 03/28/2011 0700   GFRNONAA >90 03/28/2011 0700   GFRAA >90 03/28/2011 0700    INR No results found for this basename: inr     Intake/Output Summary (Last 24 hours) at 03/28/11 0840 Last data filed at 03/28/11 0520  Gross per 24 hour  Intake   1080 ml  Output      0 ml  Net   1080 ml     Assessment/Plan:  66 y.o. female is s/p Aortogram with coil embolization of splenic artery POD 1 -WBC up this am--probably post procedure as pt is afebrile and there is no sign of infection. -N/V overnight- IVF off when I was in the room-re start fluids. -BUN/Cr normal this am.   Newton Pigg, PA-C [facility] [phone] 03/28/2011 8:40 AM  Addendum  I have independently interviewed and examined the  patient, and I agree with the physician assistant's findings. No hematoma at access site.  Pt has some mild LUQ pain--similar to prior to procedure.  If pt tolerates lunch, pt can be discharged later today.  Leonides Sake, MD [facility] [place] Office: [phone] Pager: [phone]  03/28/2011, 10:05 AM  Addendum  Pt still w/ nausea.  I suspect completely unrelated to procedure.  Will hold of D/C and re-evaluate in the AM.  Leonides Sake, MD [facility]: [phone] Pager: [phone]  03/28/2011, 2:47 PM

## 2011-03-29 NOTE — Progress Notes (Addendum)
[facility] Progress Note  03/29/2011 8:09 AM POD 2  Subjective:  Feeling much better this am.  Tolerated dinner last night and breakfast this am without difficulty.  Tm 99.7 now 99.8  93% RA Filed Vitals:   03/29/11 0445  BP: 102/61  Pulse: 82  Temp: 99.7 F (37.6 C)  Resp: 17    Physical Exam: Incisions:  Right groin without hematoma.  Slight ecchymosis Extremities:  + palpable right DP.  Abdomen:  Soft/NT/ND decreased BS  CBC    Component Value Date/Time   WBC 19.3* 03/28/2011 0700   RBC 4.37 03/28/2011 0700   HGB 12.5 03/28/2011 0700   HCT 37.9 03/28/2011 0700   PLT 192 03/28/2011 0700   MCV 86.7 03/28/2011 0700   MCH 28.6 03/28/2011 0700   MCHC 33.0 03/28/2011 0700   RDW 16.2* 03/28/2011 0700    BMET    Component Value Date/Time   NA 136 03/28/2011 0700   K 3.8 03/28/2011 0700   CL 99 03/28/2011 0700   CO2 26 03/28/2011 0700   GLUCOSE 162* 03/28/2011 0700   BUN 9 03/28/2011 0700   CREATININE 0.45* 03/28/2011 0700   CALCIUM 8.7 03/28/2011 0700   GFRNONAA >90 03/28/2011 0700   GFRAA >90 03/28/2011 0700    INR No results found for this basename: inr     Intake/Output Summary (Last 24 hours) at 03/29/11 0809 Last data filed at 03/28/11 1700  Gross per 24 hour  Intake    840 ml  Output      0 ml  Net    840 ml     Assessment/Plan:  66 y.o. female is s/p Aortogram with coil embolization of splenic artery  POD 2 -doing well.   -home today. -f/u with Dr. Darrick Penna in 4 weeks. -will give pt incentive spirometer to go home with.  Newton Pigg, PA-C [facility] [phone] 03/29/2011 8:09 AM  Addendum  I have independently interviewed and examined the patient, and I agree with the physician assistant's findings.  N&V resolved.  Etiology unknown.  Pt doing well w/o obvious evidence of splenic infarction.  Leonides Sake, MD [facility] [place] Office: [phone] Pager: [phone]  03/29/2011, 10:19  AM

## 2011-03-31 DIAGNOSIS — K219 Gastro-esophageal reflux disease without esophagitis: Secondary | ICD-10-CM | POA: Diagnosis not present

## 2011-04-07 ENCOUNTER — Telehealth: Payer: Self-pay

## 2011-04-07 NOTE — Telephone Encounter (Signed)
Pt. c/o an "increase in pain in left waistline area", that she noted over weekend.  Stated it is similar to the type of pain she experienced prior to procedure.  S/p Aortogram with Coil Embolization of Splenic Artery Aneurysm of 3/8.  Relates hx of increased cough, following procedure, that lasted 4-5 days and was felt to be related to gastric reflux.  Stated that the pain in left waist seemed to increase about the time she had persistent coughing.  Was seen per GI specialist and started on Dexilant, which pt. states has seemed to help the coughing.  Questions if the reoccurance of pain is expected.  Discussed w/ Dr. Arbie Cookey.  Stated he does not feel pt. is in any danger.  Advised pt. to call if sx's worsen.  Next appt. w/ Dr. Darrick Penna is 4/18.  Pt. verbalized understanding.

## 2011-05-05 ENCOUNTER — Encounter: Payer: Self-pay | Admitting: Vascular Surgery

## 2011-05-07 ENCOUNTER — Ambulatory Visit
Admission: RE | Admit: 2011-05-07 | Discharge: 2011-05-07 | Disposition: A | Discharge status: Home / Self Care | Payer: Medicare Other | Source: Ambulatory Visit | Attending: Vascular Surgery | Admitting: Vascular Surgery

## 2011-05-07 ENCOUNTER — Encounter: Payer: Self-pay | Admitting: Vascular Surgery

## 2011-05-07 ENCOUNTER — Ambulatory Visit (INDEPENDENT_AMBULATORY_CARE_PROVIDER_SITE_OTHER): Payer: Medicare Other | Admitting: Vascular Surgery

## 2011-05-07 VITALS — BP 122/79 | HR 69 | Resp 16 | Ht 63.0 in | Wt 182.9 lb

## 2011-05-07 DIAGNOSIS — I728 Aneurysm of other specified arteries: Secondary | ICD-10-CM

## 2011-05-07 DIAGNOSIS — Z9089 Acquired absence of other organs: Secondary | ICD-10-CM | POA: Diagnosis not present

## 2011-05-07 DIAGNOSIS — I714 Abdominal aortic aneurysm, without rupture: Secondary | ICD-10-CM | POA: Diagnosis not present

## 2011-05-07 MED ORDER — IOHEXOL 350 MG/ML SOLN
100.0000 mL | Freq: Once | INTRAVENOUS | Status: AC | PRN
  Administered 2011-05-07: 100 mL via INTRAVENOUS

## 2011-05-07 NOTE — Progress Notes (Signed)
Patient returns today for followup. She underwent coil embolization of a splenic artery aneurysm on 03/27/2011. She reports no abdominal pain at this point. She's had no swelling in her needle stick site in the groin.  Review of systems: She denies shortness of breath. She denies chest pain. She denies any symptoms of GI bleeding.  Physical exam: Filed Vitals:   05/07/11 1353  BP: 122/79  Pulse: 69  Resp: 16  Height: 5\' 3"  (1.6 m)  Weight: 182 lb 14.4 oz (82.963 kg)  SpO2: 93%    Abdomen: Soft nontender nondistended no mass  Extremities: Feet pink and warm, 2+ femoral pulses, no hematoma or pseudoaneurysm  Data: CT Angio the abdomen and pelvis is reviewed today. The splenic artery aneurysm is occluded although there is a large amount of metal scatter from the coils. There is approximately 50% infarction of her spleen.  Assessment: Successful coil embolization of splenic artery with a reasonable amount of functioning splenic tissue  Plan: Return to normal activities. She needs a followup CT angiogram of her abdomen and pelvis in 5 years  Fabienne Bruns, MD [facility] [place] Office: [phone] Pager: [phone]

## 2011-06-18 DIAGNOSIS — J019 Acute sinusitis, unspecified: Secondary | ICD-10-CM | POA: Diagnosis not present

## 2011-06-18 DIAGNOSIS — R05 Cough: Secondary | ICD-10-CM | POA: Diagnosis not present

## 2011-06-23 DIAGNOSIS — K219 Gastro-esophageal reflux disease without esophagitis: Secondary | ICD-10-CM | POA: Diagnosis not present

## 2011-08-25 DIAGNOSIS — D3130 Benign neoplasm of unspecified choroid: Secondary | ICD-10-CM | POA: Diagnosis not present

## 2011-08-26 ENCOUNTER — Other Ambulatory Visit: Payer: Self-pay | Admitting: Internal Medicine

## 2011-08-26 DIAGNOSIS — Z1231 Encounter for screening mammogram for malignant neoplasm of breast: Secondary | ICD-10-CM

## 2011-10-06 ENCOUNTER — Ambulatory Visit
Admission: RE | Admit: 2011-10-06 | Discharge: 2011-10-06 | Disposition: A | Discharge status: Home / Self Care | Payer: Medicare Other | Source: Ambulatory Visit | Attending: Internal Medicine | Admitting: Internal Medicine

## 2011-10-06 DIAGNOSIS — Z1231 Encounter for screening mammogram for malignant neoplasm of breast: Secondary | ICD-10-CM | POA: Diagnosis not present

## 2011-10-19 DIAGNOSIS — K219 Gastro-esophageal reflux disease without esophagitis: Secondary | ICD-10-CM | POA: Diagnosis not present

## 2011-10-19 DIAGNOSIS — R7309 Other abnormal glucose: Secondary | ICD-10-CM | POA: Diagnosis not present

## 2011-10-19 DIAGNOSIS — I10 Essential (primary) hypertension: Secondary | ICD-10-CM | POA: Diagnosis not present

## 2011-10-19 DIAGNOSIS — F339 Major depressive disorder, recurrent, unspecified: Secondary | ICD-10-CM | POA: Diagnosis not present

## 2011-10-19 DIAGNOSIS — Z23 Encounter for immunization: Secondary | ICD-10-CM | POA: Diagnosis not present

## 2011-10-19 DIAGNOSIS — E669 Obesity, unspecified: Secondary | ICD-10-CM | POA: Diagnosis not present

## 2011-12-20 ENCOUNTER — Emergency Department (HOSPITAL_BASED_OUTPATIENT_CLINIC_OR_DEPARTMENT_OTHER)
Admission: EM | Admit: 2011-12-20 | Discharge: 2011-12-20 | Disposition: A | Discharge status: Home / Self Care | Payer: Medicare Other | Attending: Emergency Medicine | Admitting: Emergency Medicine

## 2011-12-20 ENCOUNTER — Encounter (HOSPITAL_BASED_OUTPATIENT_CLINIC_OR_DEPARTMENT_OTHER): Payer: Self-pay | Admitting: *Deleted

## 2011-12-20 ENCOUNTER — Emergency Department (HOSPITAL_BASED_OUTPATIENT_CLINIC_OR_DEPARTMENT_OTHER): Payer: Medicare Other

## 2011-12-20 DIAGNOSIS — F411 Generalized anxiety disorder: Secondary | ICD-10-CM | POA: Insufficient documentation

## 2011-12-20 DIAGNOSIS — Y939 Activity, unspecified: Secondary | ICD-10-CM | POA: Insufficient documentation

## 2011-12-20 DIAGNOSIS — Z8701 Personal history of pneumonia (recurrent): Secondary | ICD-10-CM | POA: Diagnosis not present

## 2011-12-20 DIAGNOSIS — Z79899 Other long term (current) drug therapy: Secondary | ICD-10-CM | POA: Diagnosis not present

## 2011-12-20 DIAGNOSIS — W010XXA Fall on same level from slipping, tripping and stumbling without subsequent striking against object, initial encounter: Secondary | ICD-10-CM | POA: Insufficient documentation

## 2011-12-20 DIAGNOSIS — I10 Essential (primary) hypertension: Secondary | ICD-10-CM | POA: Diagnosis not present

## 2011-12-20 DIAGNOSIS — R197 Diarrhea, unspecified: Secondary | ICD-10-CM | POA: Diagnosis not present

## 2011-12-20 DIAGNOSIS — F329 Major depressive disorder, single episode, unspecified: Secondary | ICD-10-CM | POA: Insufficient documentation

## 2011-12-20 DIAGNOSIS — S0003XA Contusion of scalp, initial encounter: Secondary | ICD-10-CM | POA: Insufficient documentation

## 2011-12-20 DIAGNOSIS — E669 Obesity, unspecified: Secondary | ICD-10-CM | POA: Diagnosis not present

## 2011-12-20 DIAGNOSIS — S0083XA Contusion of other part of head, initial encounter: Secondary | ICD-10-CM

## 2011-12-20 DIAGNOSIS — S91309A Unspecified open wound, unspecified foot, initial encounter: Secondary | ICD-10-CM | POA: Diagnosis not present

## 2011-12-20 DIAGNOSIS — Z8709 Personal history of other diseases of the respiratory system: Secondary | ICD-10-CM | POA: Diagnosis not present

## 2011-12-20 DIAGNOSIS — Z8739 Personal history of other diseases of the musculoskeletal system and connective tissue: Secondary | ICD-10-CM | POA: Insufficient documentation

## 2011-12-20 DIAGNOSIS — Z8719 Personal history of other diseases of the digestive system: Secondary | ICD-10-CM | POA: Diagnosis not present

## 2011-12-20 DIAGNOSIS — S9030XA Contusion of unspecified foot, initial encounter: Secondary | ICD-10-CM | POA: Diagnosis not present

## 2011-12-20 DIAGNOSIS — S9031XA Contusion of right foot, initial encounter: Secondary | ICD-10-CM

## 2011-12-20 DIAGNOSIS — Y92009 Unspecified place in unspecified non-institutional (private) residence as the place of occurrence of the external cause: Secondary | ICD-10-CM | POA: Insufficient documentation

## 2011-12-20 DIAGNOSIS — S1093XA Contusion of unspecified part of neck, initial encounter: Secondary | ICD-10-CM | POA: Diagnosis not present

## 2011-12-20 DIAGNOSIS — F3289 Other specified depressive episodes: Secondary | ICD-10-CM | POA: Insufficient documentation

## 2011-12-20 DIAGNOSIS — Z8679 Personal history of other diseases of the circulatory system: Secondary | ICD-10-CM | POA: Diagnosis not present

## 2011-12-20 LAB — COMPREHENSIVE METABOLIC PANEL
ALT: 11 U/L (ref 0–35)
AST: 15 U/L (ref 0–37)
Alkaline Phosphatase: 128 U/L — ABNORMAL HIGH (ref 39–117)
CO2: 27 mEq/L (ref 19–32)
Chloride: 99 mEq/L (ref 96–112)
GFR calc Af Amer: >90 mL/min (ref >90)
GFR calc non Af Amer: >90 mL/min (ref >90)
Glucose, Bld: 128 mg/dL — ABNORMAL HIGH (ref 70–99)
Potassium: 4 mEq/L (ref 3.5–5.1)
Sodium: 138 mEq/L (ref 135–145)
Total Bilirubin: 1.3 mg/dL — ABNORMAL HIGH (ref 0.3–1.2)

## 2011-12-20 LAB — CBC WITH DIFFERENTIAL/PLATELET
Basophils Absolute: 0 10*3/uL (ref 0.0–0.1)
Eosinophils Relative: 3 % (ref 0–5)
Lymphocytes Relative: 7 % — ABNORMAL LOW (ref 12–46)
Lymphs Abs: 1.2 10*3/uL (ref 0.7–4.0)
MCV: 86.3 fL (ref 78.0–100.0)
Neutro Abs: 14.8 10*3/uL — ABNORMAL HIGH (ref 1.7–7.7)
Platelets: 259 10*3/uL (ref 150–400)
RBC: 5.24 MIL/uL — ABNORMAL HIGH (ref 3.87–5.11)
RDW: 15 % (ref 11.5–15.5)
WBC: 18.4 10*3/uL — ABNORMAL HIGH (ref 4.0–10.5)

## 2011-12-20 MED ORDER — SODIUM CHLORIDE 0.9 % IV BOLUS (SEPSIS)
1000.0000 mL | Freq: Once | INTRAVENOUS | Status: AC
  Administered 2011-12-20: 1000 mL via INTRAVENOUS

## 2011-12-20 NOTE — ED Notes (Signed)
Patient states that she tripped and fell when she got up to go to bathroom at 4am. Bruised L side of face,R breast and R foot. Ambulates w/o difficulty. Patient states that she has had some diarrhea since around 4am, none with last hour. Took nyquil before she went to bed for sinus congestion & drainage. Has been coughing at night

## 2011-12-20 NOTE — ED Notes (Signed)
Patient completing fluid prior to discharge

## 2011-12-20 NOTE — ED Provider Notes (Signed)
History     CSN: [id number]  Arrival date & time 12/20/11  0856   None     Chief Complaint  Patient presents with  . Fall    (Consider location/radiation/quality/duration/timing/severity/associated sxs/prior treatment) HPI Comments: Patient started feeling sick a few days ago with sore throat, congestion, cough.  Last night started with diarrhea.  She got up to go to the bathroom and fell.  She is unsure if she tripped or fainted, but it appears as though she struck her face, right chest, and right foot.  She is having pain in these areas.    Patient is a 66 y.o. female presenting with fall. The history is provided by the patient.  Fall Incident onset: 4 AM. The fall occurred while walking. She fell from a height of 1 to 2 ft. She landed on a hard floor. There was no blood loss. Point of impact: right foot, left cheek, left breast. Pain location: right foot, left cheek, right breast. The pain is moderate. She was ambulatory at the scene.    Past Medical History  Diagnosis Date  . Allergic rhinitis, cause unspecified   . Hypertension   . Esophageal reflux   . Obesity, unspecified   . Depressive disorder, not elsewhere classified   . Aneurysm of splenic artery     noted since 2002  . Arthritis     hands  . Pneumonia     03/27/11 "many many years ago"  . H/O hiatal hernia   . Anxiety     Past Surgical History  Procedure Date  . Nasal septum surgery 1985    deviated septum  . Tonsillectomy 1948  . Cholecystectomy 2002  . Abdominal hysterectomy 1980  . Breast biopsy 2003    right  . Breast biopsy 2012    left  . Arterial aneurysm repair 03/27/11    splenic artery  . Laparoscopic nissen fundoplication 2002    Family History  Problem Relation Age of Onset  . Cancer Mother     melanoma  . Diabetes Mother   . Heart disease Mother   . Hyperlipidemia Mother   . Diabetes Father   . Hypertension Father   . Cancer Father     prostate  . Heart disease Father   . Heart  attack Father   . Cancer Paternal Aunt   . Aneurysm Paternal Aunt     AAA  . Cancer Paternal Grandmother   . Aneurysm Paternal Uncle     AAA    History  Substance Use Topics  . Smoking status: Never Smoker   . Smokeless tobacco: Never Used  . Alcohol Use: No    OB History    Grav Para Term Preterm Abortions TAB SAB Ect Mult Living                  Review of Systems  All other systems reviewed and are negative.    Allergies  Codeine and Morphine and related  Home Medications   Current Outpatient Rx  Name  Route  Sig  Dispense  Refill  . ALPRAZOLAM 0.25 MG PO TABS   Oral   Take 0.5 mg by mouth every 4 (four) hours as needed. For anxiety         . BIOTIN PO   Oral   Take 1 capsule by mouth daily.          Marland Kitchen CALCIUM CARBONATE 600 MG PO TABS   Oral   Take 600 mg by  mouth 2 (two) times daily with a meal.         . CARVEDILOL 12.5 MG PO TABS   Oral   Take 12.5 mg by mouth 2 (two) times daily with a meal.         . CETIRIZINE HCL 10 MG PO TABS   Oral   Take 10 mg by mouth daily.         Marland Kitchen VITAMIN D3 2000 UNITS PO TABS   Oral   Take 1 capsule by mouth daily.          Marland Kitchen DEXILANT 60 MG PO CPDR   Oral   Take 60 mg by mouth daily.          Marland Kitchen DILTIAZEM HCL 90 MG PO TABS   Oral   Take 90 mg by mouth 2 (two) times daily.         . OMEGA-3 FATTY ACIDS 1000 MG PO CAPS   Oral   Take 1 g by mouth daily.         Marland Kitchen FLUOXETINE HCL 10 MG PO CAPS   Oral   Take 10 mg by mouth daily.         Marland Kitchen FLUTICASONE PROPIONATE 50 MCG/ACT NA SUSP   Nasal   Place 2 sprays into the nose daily.         Marland Kitchen GLUCOSAMINE-MSM-HYALURONIC ACD 750-375-30 MG PO TABS   Oral   Take 3 capsules by mouth daily.          Marland Kitchen NAPROXEN-ESOMEPRAZOLE 500-20 MG PO TBEC   Oral   Take 1 tablet by mouth 2 (two) times daily as needed. For osteoarthritis         . PANTOPRAZOLE SODIUM 40 MG PO TBEC   Oral   Take 40 mg by mouth daily.           BP 151/85  Pulse 104   Temp 97.7 F (36.5 C)  Resp 18  SpO2 98%  Physical Exam  Nursing note and vitals reviewed. Constitutional: She is oriented to person, place, and time. She appears well-developed and well-nourished. No distress.  HENT:  Head: Normocephalic.       There is ecchymosis, swelling adjacent to the left eye.    PO is mildly erythematous.  Eyes: EOM are normal. Pupils are equal, round, and reactive to light.       There is no diplopia upon upward gaze.    Neck: Neck supple.  Cardiovascular: Normal rate.   No murmur heard. Pulmonary/Chest: Effort normal and breath sounds normal. No respiratory distress.  Abdominal: Soft. Bowel sounds are normal. She exhibits no distension. There is no tenderness.  Musculoskeletal: Normal range of motion.       The right foot is noted to have swelling and ecchymosis over the dorsum of the lateral aspect.    Neurological: She is alert and oriented to person, place, and time. No cranial nerve deficit. Coordination normal.  Skin: Skin is warm and dry. She is not diaphoretic.    ED Course  Procedures (including critical care time)  Labs Reviewed - No data to display No results found.   No diagnosis found.    MDM  The labs show an elevated wbc but no other abnormalities.  The imaging studies are negative for fracture.  I suspect this is viral in nature and that she fell going to the bathroom, possibly related to dehydration.  She was given ivf and is feeling better.  To return  prn.        Geoffery Lyons, MD 12/20/11 [phone]

## 2011-12-22 DIAGNOSIS — R197 Diarrhea, unspecified: Secondary | ICD-10-CM | POA: Diagnosis not present

## 2011-12-22 DIAGNOSIS — K219 Gastro-esophageal reflux disease without esophagitis: Secondary | ICD-10-CM | POA: Diagnosis not present

## 2012-01-06 DIAGNOSIS — R05 Cough: Secondary | ICD-10-CM | POA: Diagnosis not present

## 2012-01-06 DIAGNOSIS — J029 Acute pharyngitis, unspecified: Secondary | ICD-10-CM | POA: Diagnosis not present

## 2012-01-06 DIAGNOSIS — J209 Acute bronchitis, unspecified: Secondary | ICD-10-CM | POA: Diagnosis not present

## 2012-03-01 DIAGNOSIS — D3130 Benign neoplasm of unspecified choroid: Secondary | ICD-10-CM | POA: Diagnosis not present

## 2012-03-01 DIAGNOSIS — R05 Cough: Secondary | ICD-10-CM | POA: Diagnosis not present

## 2012-03-01 DIAGNOSIS — I10 Essential (primary) hypertension: Secondary | ICD-10-CM | POA: Diagnosis not present

## 2012-03-01 DIAGNOSIS — J019 Acute sinusitis, unspecified: Secondary | ICD-10-CM | POA: Diagnosis not present

## 2012-03-15 ENCOUNTER — Encounter (INDEPENDENT_AMBULATORY_CARE_PROVIDER_SITE_OTHER): Payer: Medicare Other | Admitting: Ophthalmology

## 2012-07-04 ENCOUNTER — Other Ambulatory Visit: Payer: Self-pay | Admitting: Obstetrics and Gynecology

## 2012-07-04 DIAGNOSIS — Z124 Encounter for screening for malignant neoplasm of cervix: Secondary | ICD-10-CM | POA: Diagnosis not present

## 2012-07-12 DIAGNOSIS — Z800 Family history of malignant neoplasm of digestive organs: Secondary | ICD-10-CM | POA: Diagnosis not present

## 2012-07-12 DIAGNOSIS — K219 Gastro-esophageal reflux disease without esophagitis: Secondary | ICD-10-CM | POA: Diagnosis not present

## 2012-07-18 DIAGNOSIS — Z13820 Encounter for screening for osteoporosis: Secondary | ICD-10-CM | POA: Diagnosis not present

## 2012-08-29 ENCOUNTER — Other Ambulatory Visit: Payer: Self-pay

## 2012-08-29 DIAGNOSIS — Z1231 Encounter for screening mammogram for malignant neoplasm of breast: Secondary | ICD-10-CM

## 2012-09-02 DIAGNOSIS — K219 Gastro-esophageal reflux disease without esophagitis: Secondary | ICD-10-CM | POA: Diagnosis not present

## 2012-09-21 DIAGNOSIS — J309 Allergic rhinitis, unspecified: Secondary | ICD-10-CM | POA: Diagnosis not present

## 2012-09-21 DIAGNOSIS — H6980 Other specified disorders of Eustachian tube, unspecified ear: Secondary | ICD-10-CM | POA: Diagnosis not present

## 2012-09-21 DIAGNOSIS — Z23 Encounter for immunization: Secondary | ICD-10-CM | POA: Diagnosis not present

## 2012-09-23 DIAGNOSIS — H6690 Otitis media, unspecified, unspecified ear: Secondary | ICD-10-CM | POA: Diagnosis not present

## 2012-10-06 ENCOUNTER — Ambulatory Visit
Admission: RE | Admit: 2012-10-06 | Discharge: 2012-10-06 | Disposition: A | Discharge status: Home / Self Care | Payer: Medicare Other | Source: Ambulatory Visit

## 2012-10-06 ENCOUNTER — Ambulatory Visit: Payer: Medicare Other

## 2012-10-06 DIAGNOSIS — Z1231 Encounter for screening mammogram for malignant neoplasm of breast: Secondary | ICD-10-CM

## 2012-10-17 DIAGNOSIS — I10 Essential (primary) hypertension: Secondary | ICD-10-CM | POA: Diagnosis not present

## 2012-10-17 DIAGNOSIS — M25569 Pain in unspecified knee: Secondary | ICD-10-CM | POA: Diagnosis not present

## 2012-10-20 DIAGNOSIS — L820 Inflamed seborrheic keratosis: Secondary | ICD-10-CM | POA: Diagnosis not present

## 2012-10-20 DIAGNOSIS — D2360 Other benign neoplasm of skin of unspecified upper limb, including shoulder: Secondary | ICD-10-CM | POA: Diagnosis not present

## 2012-10-20 DIAGNOSIS — D2370 Other benign neoplasm of skin of unspecified lower limb, including hip: Secondary | ICD-10-CM | POA: Diagnosis not present

## 2012-10-20 DIAGNOSIS — D485 Neoplasm of uncertain behavior of skin: Secondary | ICD-10-CM | POA: Diagnosis not present

## 2012-10-24 DIAGNOSIS — M25569 Pain in unspecified knee: Secondary | ICD-10-CM | POA: Diagnosis not present

## 2012-11-02 DIAGNOSIS — M25569 Pain in unspecified knee: Secondary | ICD-10-CM | POA: Diagnosis not present

## 2012-11-04 DIAGNOSIS — R002 Palpitations: Secondary | ICD-10-CM | POA: Diagnosis not present

## 2012-11-04 DIAGNOSIS — I728 Aneurysm of other specified arteries: Secondary | ICD-10-CM | POA: Diagnosis not present

## 2012-11-04 DIAGNOSIS — K219 Gastro-esophageal reflux disease without esophagitis: Secondary | ICD-10-CM | POA: Diagnosis not present

## 2012-11-04 DIAGNOSIS — F339 Major depressive disorder, recurrent, unspecified: Secondary | ICD-10-CM | POA: Diagnosis not present

## 2012-11-04 DIAGNOSIS — Z0000 Encounter for general adult medical examination without abnormal findings: Secondary | ICD-10-CM | POA: Diagnosis not present

## 2012-11-04 DIAGNOSIS — Z23 Encounter for immunization: Secondary | ICD-10-CM | POA: Diagnosis not present

## 2012-11-04 DIAGNOSIS — I10 Essential (primary) hypertension: Secondary | ICD-10-CM | POA: Diagnosis not present

## 2012-11-04 DIAGNOSIS — R0609 Other forms of dyspnea: Secondary | ICD-10-CM | POA: Diagnosis not present

## 2012-11-04 DIAGNOSIS — R05 Cough: Secondary | ICD-10-CM | POA: Diagnosis not present

## 2012-11-04 DIAGNOSIS — R7309 Other abnormal glucose: Secondary | ICD-10-CM | POA: Diagnosis not present

## 2012-11-04 DIAGNOSIS — E669 Obesity, unspecified: Secondary | ICD-10-CM | POA: Diagnosis not present

## 2012-11-09 DIAGNOSIS — R0602 Shortness of breath: Secondary | ICD-10-CM | POA: Diagnosis not present

## 2012-11-09 DIAGNOSIS — J209 Acute bronchitis, unspecified: Secondary | ICD-10-CM | POA: Diagnosis not present

## 2012-11-15 DIAGNOSIS — J370 Chronic laryngitis: Secondary | ICD-10-CM | POA: Diagnosis not present

## 2012-11-15 DIAGNOSIS — J45909 Unspecified asthma, uncomplicated: Secondary | ICD-10-CM | POA: Diagnosis not present

## 2012-11-15 DIAGNOSIS — J309 Allergic rhinitis, unspecified: Secondary | ICD-10-CM | POA: Diagnosis not present

## 2012-12-06 DIAGNOSIS — J45909 Unspecified asthma, uncomplicated: Secondary | ICD-10-CM | POA: Diagnosis not present

## 2012-12-06 DIAGNOSIS — J309 Allergic rhinitis, unspecified: Secondary | ICD-10-CM | POA: Diagnosis not present

## 2013-01-18 DIAGNOSIS — K219 Gastro-esophageal reflux disease without esophagitis: Secondary | ICD-10-CM | POA: Diagnosis not present

## 2013-02-09 ENCOUNTER — Other Ambulatory Visit: Payer: Self-pay | Admitting: Gastroenterology

## 2013-02-09 DIAGNOSIS — K2990 Gastroduodenitis, unspecified, without bleeding: Secondary | ICD-10-CM | POA: Diagnosis not present

## 2013-02-09 DIAGNOSIS — R1013 Epigastric pain: Secondary | ICD-10-CM | POA: Diagnosis not present

## 2013-02-09 DIAGNOSIS — K319 Disease of stomach and duodenum, unspecified: Secondary | ICD-10-CM | POA: Diagnosis not present

## 2013-02-09 DIAGNOSIS — K2970 Gastritis, unspecified, without bleeding: Secondary | ICD-10-CM | POA: Diagnosis not present

## 2013-02-09 DIAGNOSIS — K3189 Other diseases of stomach and duodenum: Secondary | ICD-10-CM | POA: Diagnosis not present

## 2013-02-24 DIAGNOSIS — J309 Allergic rhinitis, unspecified: Secondary | ICD-10-CM | POA: Diagnosis not present

## 2013-02-24 DIAGNOSIS — I10 Essential (primary) hypertension: Secondary | ICD-10-CM | POA: Diagnosis not present

## 2013-02-24 DIAGNOSIS — E785 Hyperlipidemia, unspecified: Secondary | ICD-10-CM | POA: Diagnosis not present

## 2013-02-24 DIAGNOSIS — K219 Gastro-esophageal reflux disease without esophagitis: Secondary | ICD-10-CM | POA: Diagnosis not present

## 2013-02-24 DIAGNOSIS — R7309 Other abnormal glucose: Secondary | ICD-10-CM | POA: Diagnosis not present

## 2013-02-24 DIAGNOSIS — M1710 Unilateral primary osteoarthritis, unspecified knee: Secondary | ICD-10-CM | POA: Diagnosis not present

## 2013-02-24 DIAGNOSIS — IMO0002 Reserved for concepts with insufficient information to code with codable children: Secondary | ICD-10-CM | POA: Diagnosis not present

## 2013-02-24 DIAGNOSIS — Z1331 Encounter for screening for depression: Secondary | ICD-10-CM | POA: Diagnosis not present

## 2013-02-24 DIAGNOSIS — F341 Dysthymic disorder: Secondary | ICD-10-CM | POA: Diagnosis not present

## 2013-02-24 DIAGNOSIS — J45909 Unspecified asthma, uncomplicated: Secondary | ICD-10-CM | POA: Diagnosis not present

## 2013-02-27 DIAGNOSIS — E785 Hyperlipidemia, unspecified: Secondary | ICD-10-CM | POA: Diagnosis not present

## 2013-02-27 DIAGNOSIS — R7309 Other abnormal glucose: Secondary | ICD-10-CM | POA: Diagnosis not present

## 2013-02-28 DIAGNOSIS — J309 Allergic rhinitis, unspecified: Secondary | ICD-10-CM | POA: Diagnosis not present

## 2013-02-28 DIAGNOSIS — J45909 Unspecified asthma, uncomplicated: Secondary | ICD-10-CM | POA: Diagnosis not present

## 2013-03-15 ENCOUNTER — Ambulatory Visit (INDEPENDENT_AMBULATORY_CARE_PROVIDER_SITE_OTHER): Payer: Medicare Other | Admitting: Ophthalmology

## 2013-03-22 DIAGNOSIS — M1990 Unspecified osteoarthritis, unspecified site: Secondary | ICD-10-CM | POA: Diagnosis not present

## 2013-03-22 DIAGNOSIS — K219 Gastro-esophageal reflux disease without esophagitis: Secondary | ICD-10-CM | POA: Diagnosis not present

## 2013-03-28 DIAGNOSIS — H26499 Other secondary cataract, unspecified eye: Secondary | ICD-10-CM | POA: Diagnosis not present

## 2013-03-29 ENCOUNTER — Ambulatory Visit (INDEPENDENT_AMBULATORY_CARE_PROVIDER_SITE_OTHER): Payer: Medicare Other | Admitting: Ophthalmology

## 2013-03-29 DIAGNOSIS — I10 Essential (primary) hypertension: Secondary | ICD-10-CM | POA: Diagnosis not present

## 2013-03-29 DIAGNOSIS — H35039 Hypertensive retinopathy, unspecified eye: Secondary | ICD-10-CM | POA: Diagnosis not present

## 2013-03-29 DIAGNOSIS — D3130 Benign neoplasm of unspecified choroid: Secondary | ICD-10-CM | POA: Diagnosis not present

## 2013-03-29 DIAGNOSIS — H43819 Vitreous degeneration, unspecified eye: Secondary | ICD-10-CM | POA: Diagnosis not present

## 2013-04-11 DIAGNOSIS — H26499 Other secondary cataract, unspecified eye: Secondary | ICD-10-CM | POA: Diagnosis not present

## 2013-05-02 DIAGNOSIS — H26499 Other secondary cataract, unspecified eye: Secondary | ICD-10-CM | POA: Diagnosis not present

## 2013-06-21 DIAGNOSIS — R350 Frequency of micturition: Secondary | ICD-10-CM | POA: Diagnosis not present

## 2013-06-27 DIAGNOSIS — N390 Urinary tract infection, site not specified: Secondary | ICD-10-CM | POA: Diagnosis not present

## 2013-06-27 DIAGNOSIS — J45909 Unspecified asthma, uncomplicated: Secondary | ICD-10-CM | POA: Diagnosis not present

## 2013-06-27 DIAGNOSIS — J309 Allergic rhinitis, unspecified: Secondary | ICD-10-CM | POA: Diagnosis not present

## 2013-07-18 DIAGNOSIS — N390 Urinary tract infection, site not specified: Secondary | ICD-10-CM | POA: Diagnosis not present

## 2013-07-18 DIAGNOSIS — N952 Postmenopausal atrophic vaginitis: Secondary | ICD-10-CM | POA: Diagnosis not present

## 2013-07-18 DIAGNOSIS — R350 Frequency of micturition: Secondary | ICD-10-CM | POA: Diagnosis not present

## 2013-07-25 DIAGNOSIS — E785 Hyperlipidemia, unspecified: Secondary | ICD-10-CM | POA: Diagnosis not present

## 2013-07-25 DIAGNOSIS — M1710 Unilateral primary osteoarthritis, unspecified knee: Secondary | ICD-10-CM | POA: Diagnosis not present

## 2013-07-25 DIAGNOSIS — I10 Essential (primary) hypertension: Secondary | ICD-10-CM | POA: Diagnosis not present

## 2013-07-25 DIAGNOSIS — IMO0002 Reserved for concepts with insufficient information to code with codable children: Secondary | ICD-10-CM | POA: Diagnosis not present

## 2013-07-25 DIAGNOSIS — R7309 Other abnormal glucose: Secondary | ICD-10-CM | POA: Diagnosis not present

## 2013-07-25 DIAGNOSIS — Z6831 Body mass index (BMI) 31.0-31.9, adult: Secondary | ICD-10-CM | POA: Diagnosis not present

## 2013-07-25 DIAGNOSIS — K219 Gastro-esophageal reflux disease without esophagitis: Secondary | ICD-10-CM | POA: Diagnosis not present

## 2013-08-01 DIAGNOSIS — M1710 Unilateral primary osteoarthritis, unspecified knee: Secondary | ICD-10-CM | POA: Diagnosis not present

## 2013-08-03 DIAGNOSIS — Z01419 Encounter for gynecological examination (general) (routine) without abnormal findings: Secondary | ICD-10-CM | POA: Diagnosis not present

## 2013-08-03 DIAGNOSIS — N952 Postmenopausal atrophic vaginitis: Secondary | ICD-10-CM | POA: Diagnosis not present

## 2013-08-03 DIAGNOSIS — B372 Candidiasis of skin and nail: Secondary | ICD-10-CM | POA: Diagnosis not present

## 2013-08-06 DIAGNOSIS — H6690 Otitis media, unspecified, unspecified ear: Secondary | ICD-10-CM | POA: Diagnosis not present

## 2013-08-06 DIAGNOSIS — J309 Allergic rhinitis, unspecified: Secondary | ICD-10-CM | POA: Diagnosis not present

## 2013-09-06 DIAGNOSIS — D230 Other benign neoplasm of skin of lip: Secondary | ICD-10-CM | POA: Diagnosis not present

## 2013-09-06 DIAGNOSIS — L821 Other seborrheic keratosis: Secondary | ICD-10-CM | POA: Diagnosis not present

## 2013-09-11 ENCOUNTER — Other Ambulatory Visit: Payer: Self-pay

## 2013-09-11 DIAGNOSIS — Z1231 Encounter for screening mammogram for malignant neoplasm of breast: Secondary | ICD-10-CM

## 2013-09-19 DIAGNOSIS — M7989 Other specified soft tissue disorders: Secondary | ICD-10-CM | POA: Diagnosis not present

## 2013-09-19 DIAGNOSIS — M79609 Pain in unspecified limb: Secondary | ICD-10-CM | POA: Diagnosis not present

## 2013-09-28 DIAGNOSIS — M79609 Pain in unspecified limb: Secondary | ICD-10-CM | POA: Diagnosis not present

## 2013-09-28 DIAGNOSIS — M7989 Other specified soft tissue disorders: Secondary | ICD-10-CM | POA: Diagnosis not present

## 2013-10-06 DIAGNOSIS — I8310 Varicose veins of unspecified lower extremity with inflammation: Secondary | ICD-10-CM | POA: Diagnosis not present

## 2013-10-09 ENCOUNTER — Ambulatory Visit
Admission: RE | Admit: 2013-10-09 | Discharge: 2013-10-09 | Disposition: A | Discharge status: Home / Self Care | Payer: Medicare Other | Source: Ambulatory Visit

## 2013-10-09 DIAGNOSIS — Z1231 Encounter for screening mammogram for malignant neoplasm of breast: Secondary | ICD-10-CM | POA: Diagnosis not present

## 2013-10-31 DIAGNOSIS — Z0000 Encounter for general adult medical examination without abnormal findings: Secondary | ICD-10-CM | POA: Diagnosis not present

## 2013-11-01 DIAGNOSIS — R05 Cough: Secondary | ICD-10-CM | POA: Diagnosis not present

## 2013-11-01 DIAGNOSIS — J40 Bronchitis, not specified as acute or chronic: Secondary | ICD-10-CM | POA: Diagnosis not present

## 2013-11-10 DIAGNOSIS — J0100 Acute maxillary sinusitis, unspecified: Secondary | ICD-10-CM | POA: Diagnosis not present

## 2013-11-10 DIAGNOSIS — H68003 Unspecified Eustachian salpingitis, bilateral: Secondary | ICD-10-CM | POA: Diagnosis not present

## 2013-11-20 DIAGNOSIS — E785 Hyperlipidemia, unspecified: Secondary | ICD-10-CM | POA: Diagnosis not present

## 2013-11-20 DIAGNOSIS — Z79899 Other long term (current) drug therapy: Secondary | ICD-10-CM | POA: Diagnosis not present

## 2013-11-20 DIAGNOSIS — R8299 Other abnormal findings in urine: Secondary | ICD-10-CM | POA: Diagnosis not present

## 2013-11-20 DIAGNOSIS — I10 Essential (primary) hypertension: Secondary | ICD-10-CM | POA: Diagnosis not present

## 2013-11-27 DIAGNOSIS — E785 Hyperlipidemia, unspecified: Secondary | ICD-10-CM | POA: Diagnosis not present

## 2013-11-27 DIAGNOSIS — K449 Diaphragmatic hernia without obstruction or gangrene: Secondary | ICD-10-CM | POA: Diagnosis not present

## 2013-11-27 DIAGNOSIS — M179 Osteoarthritis of knee, unspecified: Secondary | ICD-10-CM | POA: Diagnosis not present

## 2013-11-27 DIAGNOSIS — J309 Allergic rhinitis, unspecified: Secondary | ICD-10-CM | POA: Diagnosis not present

## 2013-11-27 DIAGNOSIS — R7309 Other abnormal glucose: Secondary | ICD-10-CM | POA: Diagnosis not present

## 2013-11-27 DIAGNOSIS — Z9049 Acquired absence of other specified parts of digestive tract: Secondary | ICD-10-CM | POA: Diagnosis not present

## 2013-11-27 DIAGNOSIS — Z23 Encounter for immunization: Secondary | ICD-10-CM | POA: Diagnosis not present

## 2013-11-27 DIAGNOSIS — I10 Essential (primary) hypertension: Secondary | ICD-10-CM | POA: Diagnosis not present

## 2013-11-27 DIAGNOSIS — Z0000 Encounter for general adult medical examination without abnormal findings: Secondary | ICD-10-CM | POA: Diagnosis not present

## 2013-11-28 DIAGNOSIS — Z1212 Encounter for screening for malignant neoplasm of rectum: Secondary | ICD-10-CM | POA: Diagnosis not present

## 2013-12-12 DIAGNOSIS — H903 Sensorineural hearing loss, bilateral: Secondary | ICD-10-CM | POA: Diagnosis not present

## 2013-12-21 DIAGNOSIS — L918 Other hypertrophic disorders of the skin: Secondary | ICD-10-CM | POA: Diagnosis not present

## 2013-12-21 DIAGNOSIS — D2261 Melanocytic nevi of right upper limb, including shoulder: Secondary | ICD-10-CM | POA: Diagnosis not present

## 2013-12-21 DIAGNOSIS — L821 Other seborrheic keratosis: Secondary | ICD-10-CM | POA: Diagnosis not present

## 2013-12-21 DIAGNOSIS — L603 Nail dystrophy: Secondary | ICD-10-CM | POA: Diagnosis not present

## 2013-12-21 DIAGNOSIS — D2271 Melanocytic nevi of right lower limb, including hip: Secondary | ICD-10-CM | POA: Diagnosis not present

## 2013-12-21 DIAGNOSIS — D2272 Melanocytic nevi of left lower limb, including hip: Secondary | ICD-10-CM | POA: Diagnosis not present

## 2013-12-21 DIAGNOSIS — L570 Actinic keratosis: Secondary | ICD-10-CM | POA: Diagnosis not present

## 2013-12-26 DIAGNOSIS — I83813 Varicose veins of bilateral lower extremities with pain: Secondary | ICD-10-CM | POA: Diagnosis not present

## 2013-12-26 DIAGNOSIS — I83893 Varicose veins of bilateral lower extremities with other complications: Secondary | ICD-10-CM | POA: Diagnosis not present

## 2013-12-28 ENCOUNTER — Encounter (HOSPITAL_COMMUNITY): Payer: Self-pay | Admitting: Vascular Surgery

## 2014-01-02 DIAGNOSIS — J309 Allergic rhinitis, unspecified: Secondary | ICD-10-CM | POA: Diagnosis not present

## 2014-01-02 DIAGNOSIS — J4540 Moderate persistent asthma, uncomplicated: Secondary | ICD-10-CM | POA: Diagnosis not present

## 2014-01-17 DIAGNOSIS — Z6832 Body mass index (BMI) 32.0-32.9, adult: Secondary | ICD-10-CM | POA: Diagnosis not present

## 2014-01-17 DIAGNOSIS — J029 Acute pharyngitis, unspecified: Secondary | ICD-10-CM | POA: Diagnosis not present

## 2014-01-17 DIAGNOSIS — J45909 Unspecified asthma, uncomplicated: Secondary | ICD-10-CM | POA: Diagnosis not present

## 2014-01-17 DIAGNOSIS — R112 Nausea with vomiting, unspecified: Secondary | ICD-10-CM | POA: Diagnosis not present

## 2014-02-06 DIAGNOSIS — I8311 Varicose veins of right lower extremity with inflammation: Secondary | ICD-10-CM | POA: Diagnosis not present

## 2014-02-06 DIAGNOSIS — M79604 Pain in right leg: Secondary | ICD-10-CM | POA: Diagnosis not present

## 2014-02-06 DIAGNOSIS — M79651 Pain in right thigh: Secondary | ICD-10-CM | POA: Diagnosis not present

## 2014-02-14 DIAGNOSIS — S0081XA Abrasion of other part of head, initial encounter: Secondary | ICD-10-CM | POA: Diagnosis not present

## 2014-02-14 DIAGNOSIS — W5503XA Scratched by cat, initial encounter: Secondary | ICD-10-CM | POA: Diagnosis not present

## 2014-02-14 DIAGNOSIS — S01111A Laceration without foreign body of right eyelid and periocular area, initial encounter: Secondary | ICD-10-CM | POA: Diagnosis not present

## 2014-02-14 DIAGNOSIS — Z6833 Body mass index (BMI) 33.0-33.9, adult: Secondary | ICD-10-CM | POA: Diagnosis not present

## 2014-02-19 DIAGNOSIS — S0531XD Ocular laceration without prolapse or loss of intraocular tissue, right eye, subsequent encounter: Secondary | ICD-10-CM | POA: Diagnosis not present

## 2014-02-19 DIAGNOSIS — Z23 Encounter for immunization: Secondary | ICD-10-CM | POA: Diagnosis not present

## 2014-02-19 DIAGNOSIS — N898 Other specified noninflammatory disorders of vagina: Secondary | ICD-10-CM | POA: Diagnosis not present

## 2014-02-19 DIAGNOSIS — Z681 Body mass index (BMI) 19 or less, adult: Secondary | ICD-10-CM | POA: Diagnosis not present

## 2014-02-20 DIAGNOSIS — I83811 Varicose veins of right lower extremities with pain: Secondary | ICD-10-CM | POA: Diagnosis not present

## 2014-02-20 DIAGNOSIS — M7981 Nontraumatic hematoma of soft tissue: Secondary | ICD-10-CM | POA: Diagnosis not present

## 2014-02-20 DIAGNOSIS — I8311 Varicose veins of right lower extremity with inflammation: Secondary | ICD-10-CM | POA: Diagnosis not present

## 2014-02-26 DIAGNOSIS — R3915 Urgency of urination: Secondary | ICD-10-CM | POA: Diagnosis not present

## 2014-02-26 DIAGNOSIS — R350 Frequency of micturition: Secondary | ICD-10-CM | POA: Diagnosis not present

## 2014-02-26 DIAGNOSIS — N760 Acute vaginitis: Secondary | ICD-10-CM | POA: Diagnosis not present

## 2014-03-08 DIAGNOSIS — I8311 Varicose veins of right lower extremity with inflammation: Secondary | ICD-10-CM | POA: Diagnosis not present

## 2014-03-08 DIAGNOSIS — I83811 Varicose veins of right lower extremities with pain: Secondary | ICD-10-CM | POA: Diagnosis not present

## 2014-03-08 DIAGNOSIS — M7981 Nontraumatic hematoma of soft tissue: Secondary | ICD-10-CM | POA: Diagnosis not present

## 2014-03-20 DIAGNOSIS — I83812 Varicose veins of left lower extremities with pain: Secondary | ICD-10-CM | POA: Diagnosis not present

## 2014-03-20 DIAGNOSIS — I8312 Varicose veins of left lower extremity with inflammation: Secondary | ICD-10-CM | POA: Diagnosis not present

## 2014-03-20 DIAGNOSIS — M7981 Nontraumatic hematoma of soft tissue: Secondary | ICD-10-CM | POA: Diagnosis not present

## 2014-04-02 ENCOUNTER — Ambulatory Visit (INDEPENDENT_AMBULATORY_CARE_PROVIDER_SITE_OTHER): Payer: Medicare Other | Admitting: Ophthalmology

## 2014-04-11 DIAGNOSIS — I83812 Varicose veins of left lower extremities with pain: Secondary | ICD-10-CM | POA: Diagnosis not present

## 2014-04-11 DIAGNOSIS — I8312 Varicose veins of left lower extremity with inflammation: Secondary | ICD-10-CM | POA: Diagnosis not present

## 2014-04-11 DIAGNOSIS — M7981 Nontraumatic hematoma of soft tissue: Secondary | ICD-10-CM | POA: Diagnosis not present

## 2014-04-16 DIAGNOSIS — I10 Essential (primary) hypertension: Secondary | ICD-10-CM | POA: Diagnosis not present

## 2014-04-16 DIAGNOSIS — Z6832 Body mass index (BMI) 32.0-32.9, adult: Secondary | ICD-10-CM | POA: Diagnosis not present

## 2014-04-25 DIAGNOSIS — Z6832 Body mass index (BMI) 32.0-32.9, adult: Secondary | ICD-10-CM | POA: Diagnosis not present

## 2014-04-25 DIAGNOSIS — I10 Essential (primary) hypertension: Secondary | ICD-10-CM | POA: Diagnosis not present

## 2014-05-01 ENCOUNTER — Ambulatory Visit (INDEPENDENT_AMBULATORY_CARE_PROVIDER_SITE_OTHER): Payer: Medicare Other | Admitting: Ophthalmology

## 2014-05-01 DIAGNOSIS — D3131 Benign neoplasm of right choroid: Secondary | ICD-10-CM | POA: Diagnosis not present

## 2014-05-01 DIAGNOSIS — H43813 Vitreous degeneration, bilateral: Secondary | ICD-10-CM

## 2014-05-01 DIAGNOSIS — I10 Essential (primary) hypertension: Secondary | ICD-10-CM

## 2014-05-01 DIAGNOSIS — H35033 Hypertensive retinopathy, bilateral: Secondary | ICD-10-CM

## 2014-05-08 ENCOUNTER — Emergency Department (HOSPITAL_BASED_OUTPATIENT_CLINIC_OR_DEPARTMENT_OTHER): Payer: Medicare Other

## 2014-05-08 ENCOUNTER — Emergency Department (HOSPITAL_BASED_OUTPATIENT_CLINIC_OR_DEPARTMENT_OTHER)
Admission: EM | Admit: 2014-05-08 | Discharge: 2014-05-08 | Disposition: A | Discharge status: Home / Self Care | Payer: Medicare Other | Attending: Emergency Medicine | Admitting: Emergency Medicine

## 2014-05-08 ENCOUNTER — Encounter (HOSPITAL_BASED_OUTPATIENT_CLINIC_OR_DEPARTMENT_OTHER): Payer: Self-pay | Admitting: *Deleted

## 2014-05-08 DIAGNOSIS — Z7951 Long term (current) use of inhaled steroids: Secondary | ICD-10-CM | POA: Insufficient documentation

## 2014-05-08 DIAGNOSIS — Y998 Other external cause status: Secondary | ICD-10-CM | POA: Diagnosis not present

## 2014-05-08 DIAGNOSIS — M19072 Primary osteoarthritis, left ankle and foot: Secondary | ICD-10-CM | POA: Diagnosis not present

## 2014-05-08 DIAGNOSIS — M1990 Unspecified osteoarthritis, unspecified site: Secondary | ICD-10-CM | POA: Diagnosis not present

## 2014-05-08 DIAGNOSIS — Y9389 Activity, other specified: Secondary | ICD-10-CM | POA: Insufficient documentation

## 2014-05-08 DIAGNOSIS — Z8701 Personal history of pneumonia (recurrent): Secondary | ICD-10-CM | POA: Insufficient documentation

## 2014-05-08 DIAGNOSIS — M7732 Calcaneal spur, left foot: Secondary | ICD-10-CM | POA: Diagnosis not present

## 2014-05-08 DIAGNOSIS — E669 Obesity, unspecified: Secondary | ICD-10-CM | POA: Insufficient documentation

## 2014-05-08 DIAGNOSIS — Z79899 Other long term (current) drug therapy: Secondary | ICD-10-CM | POA: Diagnosis not present

## 2014-05-08 DIAGNOSIS — K219 Gastro-esophageal reflux disease without esophagitis: Secondary | ICD-10-CM | POA: Diagnosis not present

## 2014-05-08 DIAGNOSIS — Z8709 Personal history of other diseases of the respiratory system: Secondary | ICD-10-CM | POA: Diagnosis not present

## 2014-05-08 DIAGNOSIS — I10 Essential (primary) hypertension: Secondary | ICD-10-CM | POA: Insufficient documentation

## 2014-05-08 DIAGNOSIS — X58XXXA Exposure to other specified factors, initial encounter: Secondary | ICD-10-CM | POA: Insufficient documentation

## 2014-05-08 DIAGNOSIS — S93602A Unspecified sprain of left foot, initial encounter: Secondary | ICD-10-CM | POA: Insufficient documentation

## 2014-05-08 DIAGNOSIS — S99922A Unspecified injury of left foot, initial encounter: Secondary | ICD-10-CM | POA: Diagnosis present

## 2014-05-08 DIAGNOSIS — F419 Anxiety disorder, unspecified: Secondary | ICD-10-CM | POA: Insufficient documentation

## 2014-05-08 DIAGNOSIS — M79672 Pain in left foot: Secondary | ICD-10-CM | POA: Diagnosis not present

## 2014-05-08 DIAGNOSIS — M79605 Pain in left leg: Secondary | ICD-10-CM | POA: Diagnosis not present

## 2014-05-08 DIAGNOSIS — F329 Major depressive disorder, single episode, unspecified: Secondary | ICD-10-CM | POA: Diagnosis not present

## 2014-05-08 DIAGNOSIS — Y9289 Other specified places as the place of occurrence of the external cause: Secondary | ICD-10-CM | POA: Insufficient documentation

## 2014-05-08 NOTE — ED Provider Notes (Signed)
CSN: [id number]     Arrival date & time 05/08/14  70 History   First MD Initiated Contact with Patient 05/08/14 1847     Chief Complaint  Patient presents with  . Foot Pain     (Consider location/radiation/quality/duration/timing/severity/associated sxs/prior Treatment) The history is provided by the patient. No language interpreter was used.  Tasha Hunt is a 69 y/o F with PMHx of HTN, esophageal reflux, obesity, depression, aneurysm, pneumonia, anxiety, hiatal hernia presenting to the ED with sudden onset of left foot pain that started afternoon at approximately 12:00PM while at the movie theater. Patient reported that the pain is localized to the top of her left foot described as a throbbing pain - stated that the pain stays within the top of her foot without radiation. Patient reported that pain is worse when applying pressure and walking. Patient reported that she had sclerotherapy for her veins 4 weeks ago - 04/11/2014. Stated that she has been walking approximately one mile on the treadmill for the past 2 months everyday. Patient reported that she went to an Urgent Care who recommended to come to the ED to get US performed to rule out DVT. Denied swelling, changes to skin colored, shin splints, drainage, fever, travel, chest pain, shortness of breath, difficulty breathing, fall, injury, numbness, tingling, loss of sensation, hot to touch. PCP Dr. Theodis Sato  Past Medical History  Diagnosis Date  . Allergic rhinitis, cause unspecified   . Hypertension   . Esophageal reflux   . Obesity, unspecified   . Depressive disorder, not elsewhere classified   . Aneurysm of splenic artery     noted since 2002  . Arthritis     hands  . Pneumonia     03/27/11 "many many years ago"  . H/O hiatal hernia   . Anxiety    Past Surgical History  Procedure Laterality Date  . Nasal septum surgery  1985    deviated septum  . Tonsillectomy  1948  . Cholecystectomy  2002  . Abdominal hysterectomy   1980  . Breast biopsy  2003    right  . Breast biopsy  2012    left  . Arterial aneurysm repair  03/27/11    splenic artery  . Laparoscopic nissen fundoplication  7989  . Embolization N/A 03/27/2011    Procedure: EMBOLIZATION;  Surgeon: Elam Dutch, MD;  Location: [facility] CATH LAB;  Service: Cardiovascular;  Laterality: N/A;   Family History  Problem Relation Age of Onset  . Cancer Mother     melanoma  . Diabetes Mother   . Heart disease Mother   . Hyperlipidemia Mother   . Diabetes Father   . Hypertension Father   . Cancer Father     prostate  . Heart disease Father   . Heart attack Father   . Cancer Paternal Aunt   . Aneurysm Paternal Aunt     AAA  . Cancer Paternal Grandmother   . Aneurysm Paternal Uncle     AAA   History  Substance Use Topics  . Smoking status: Never Smoker   . Smokeless tobacco: Never Used  . Alcohol Use: No   OB History    No data available     Review of Systems  Respiratory: Negative for chest tightness and shortness of breath.   Cardiovascular: Negative for chest pain.  Musculoskeletal: Positive for arthralgias (left foot pain ).  Skin: Negative for color change and rash.  Neurological: Negative for weakness and numbness.  Allergies  Codeine and Morphine and related  Home Medications   Prior to Admission medications   Medication Sig Start Date End Date Taking? Authorizing Provider  ALPRAZolam (XANAX) 0.25 MG tablet Take 0.5 mg by mouth every 4 (four) hours as needed. For anxiety    Historical Provider, MD  BIOTIN PO Take 1 capsule by mouth daily.     Historical Provider, MD  calcium carbonate (OS-CAL) 600 MG TABS Take 600 mg by mouth 2 (two) times daily with a meal.    Historical Provider, MD  carvedilol (COREG) 12.5 MG tablet Take 12.5 mg by mouth 2 (two) times daily with a meal.    Historical Provider, MD  cetirizine (ZYRTEC) 10 MG tablet Take 10 mg by mouth daily.    Historical Provider, MD  Cholecalciferol (VITAMIN D3) 2000  UNITS TABS Take 1 capsule by mouth daily.     Historical Provider, MD  DEXILANT 60 MG capsule Take 60 mg by mouth daily.  04/07/11   Historical Provider, MD  diltiazem (CARDIZEM) 90 MG tablet Take 90 mg by mouth 2 (two) times daily.    Historical Provider, MD  fish oil-omega-3 fatty acids 1000 MG capsule Take 1 g by mouth daily.    Historical Provider, MD  FLUoxetine (PROZAC) 10 MG capsule Take 10 mg by mouth daily.    Historical Provider, MD  fluticasone (FLONASE) 50 MCG/ACT nasal spray Place 2 sprays into the nose daily.    Historical Provider, MD  Glucosamine-MSM-Hyaluronic Acd 750-375-30 MG TABS Take 3 capsules by mouth daily.     Historical Provider, MD  Naproxen-Esomeprazole (VIMOVO) 500-20 MG TBEC Take 1 tablet by mouth 2 (two) times daily as needed. For osteoarthritis    Historical Provider, MD  pantoprazole (PROTONIX) 40 MG tablet Take 40 mg by mouth daily.    Historical Provider, MD   BP 149/72 mmHg  Pulse 72  Temp(Src) 98.3 F (36.8 C) (Oral)  Resp 16  Ht 5\' 2"  (1.575 m)  Wt 172 lb (78.019 kg)  BMI 31.45 kg/m2  SpO2 98% Physical Exam  Constitutional: She is oriented to person, place, and time. She appears well-developed and well-nourished. No distress.  HENT:  Head: Normocephalic and atraumatic.  Mouth/Throat: Oropharynx is clear and moist.  Eyes: Conjunctivae and EOM are normal.  Neck: Normal range of motion. Neck supple.  Cardiovascular: Normal rate, regular rhythm and normal heart sounds.   Pulses:      Radial pulses are 2+ on the right side, and 2+ on the left side.       Dorsalis pedis pulses are 2+ on the right side, and 2+ on the left side.       Posterior tibial pulses are 2+ on the right side, and 2+ on the left side.  Negative swelling or pitting edema noted to the lower extremities bilaterally  Pulmonary/Chest: Effort normal and breath sounds normal. No respiratory distress. She has no wheezes. She has no rales.  Musculoskeletal: Normal range of motion. She  exhibits tenderness. She exhibits no edema.  Negative swelling, erythema, inflammation, lesions, sores, deformities identified to left foot. Negative red streaks or signs of cellulitic infection. Tenderness upon palpation to the dorsal aspect of the left foot. Negative ecchymosis. Negative masses identified. Full flexion, extension, inversion and eversion the left foot noted. Patient is able to wiggle toes without difficulty. Full ROM to the LLE.   Neurological: She is alert and oriented to person, place, and time. No cranial nerve deficit. She exhibits normal muscle tone.  Coordination normal.  Cranial nerves III-XII grossly intact Strength 5+/5+ to lower extremities bilaterally with resistance applied, equal distribution noted Sensation intact with differentiation to sharp and dull touch  Skin: Skin is warm and dry. She is not diaphoretic. No erythema.  Psychiatric: She has a normal mood and affect. Her behavior is normal. Thought content normal.  Nursing note and vitals reviewed.   ED Course  Procedures (including critical care time) Labs Review Labs Reviewed - No data to display  Imaging Review Dg Ankle Complete Left  05/08/2014   CLINICAL DATA:  Left ankle and foot pain.  No known injury.  EXAM: LEFT FOOT - COMPLETE 3+ VIEW; LEFT ANKLE COMPLETE - 3+ VIEW  COMPARISON:  None.  FINDINGS: There is no evidence of fracture or dislocation. Mild osteoarthritis of the first MTP joint. Small plantar calcaneal spur. Normal ankle mortise. Soft tissues are unremarkable.  IMPRESSION: No acute osseous injury of the left ankle and foot.   Electronically Signed   By: Kathreen Devoid   On: 05/08/2014 20:56   US Venous Img Lower Unilateral Left  05/08/2014   CLINICAL DATA:  Sudden onset of left lower extremity pain.  EXAM: Left LOWER EXTREMITY VENOUS DOPPLER ULTRASOUND  TECHNIQUE: Gray-scale sonography with graded compression, as well as color Doppler and duplex ultrasound were performed to evaluate the lower  extremity deep venous systems from the level of the common femoral vein and including the common femoral, femoral, profunda femoral, popliteal and calf veins including the posterior tibial, peroneal and gastrocnemius veins when visible. The superficial great saphenous vein was also interrogated. Spectral Doppler was utilized to evaluate flow at rest and with distal augmentation maneuvers in the common femoral, femoral and popliteal veins.  COMPARISON:  None.  FINDINGS: Contralateral Common Femoral Vein: Respiratory phasicity is normal and symmetric with the symptomatic side. No evidence of thrombus. Normal compressibility.  Common Femoral Vein: No evidence of thrombus. Normal compressibility, respiratory phasicity and response to augmentation.  Saphenofemoral Junction: No evidence of thrombus. Normal compressibility and flow on color Doppler imaging.  Profunda Femoral Vein: No evidence of thrombus. Normal compressibility and flow on color Doppler imaging.  Femoral Vein: No evidence of thrombus. Normal compressibility, respiratory phasicity and response to augmentation.  Popliteal Vein: No evidence of thrombus. Normal compressibility, respiratory phasicity and response to augmentation.  Calf Veins: No evidence of thrombus. Normal compressibility and flow on color Doppler imaging.  Superficial Great Saphenous Vein: No evidence of thrombus. Normal compressibility and flow on color Doppler imaging.  Venous Reflux:  None.  Other Findings:  None.  IMPRESSION: No evidence of deep venous thrombosis.   Electronically Signed   By: Nelson Chimes M.D.   On: 05/08/2014 20:08   Dg Foot Complete Left  05/08/2014   CLINICAL DATA:  Left ankle and foot pain.  No known injury.  EXAM: LEFT FOOT - COMPLETE 3+ VIEW; LEFT ANKLE COMPLETE - 3+ VIEW  COMPARISON:  None.  FINDINGS: There is no evidence of fracture or dislocation. Mild osteoarthritis of the first MTP joint. Small plantar calcaneal spur. Normal ankle mortise. Soft tissues are  unremarkable.  IMPRESSION: No acute osseous injury of the left ankle and foot.   Electronically Signed   By: Kathreen Devoid   On: 05/08/2014 20:56     EKG Interpretation None      MDM   Final diagnoses:  Foot sprain, left, initial encounter    Medications - No data to display  Filed Vitals:   05/08/14 1735 05/08/14  2003  BP: 136/64 149/72  Pulse: 77 72  Temp: 98.3 F (36.8 C)   TempSrc: Oral   Resp: 16 16  Height: 5\' 2"  (1.575 m)   Weight: 172 lb (78.019 kg)   SpO2: 96% 98%   Plain film of left foot no acute osseous abnormalities identified. Plain film of left ankle negative for acute osseous injury. Doppler of the left lower extremity negative for DVT. Patient presenting to the ED with left foot pain localized to the dorsal aspect that started suddenly this afternoon. Negative focal neurological deficits. Pulses palpable and strong. Full range of motion identified. Negative masses or deviations identified. Negative findings of cellulitic infection. Doubt septic joint. Negative findings of DVT. Imaging unremarkable. Suspicion to be possible muscle strain secondary to increase walking over the past couple of months. Patient placed in postop shoe for comfort purposes. Patient stable, afebrile. Patient not septic appearing. Negative signs of ischemia. Discharge patient. Referred patient to PCP and orthopedics. Discussed with patient to rest, ice, elevate. Discussed with patient to avoid any physical strenuous activity for the next couple of days. Discussed with patient to closely monitor symptoms and if symptoms are to worsen or change to report back to the ED - strict return instructions given.  Patient agreed to plan of care, understood, all questions answered.   [employer], PA-C 05/08/14 [id number]  Malvin Johns, MD 05/09/14 [phone]

## 2014-05-08 NOTE — Discharge Instructions (Signed)
Please call your doctor for a followup appointment within 24-48 hours. When you talk to your doctor please let them know that you were seen in the emergency department and have them acquire all of your records so that they can discuss the findings with you and formulate a treatment plan to fully care for your new and ongoing problems. Please call and set-up an appointment with your primary care provider Please call and set-up an appointment with Orthopedics Please rest and stay hydrated Please keep foot in post-op shoe for comfort and proper positioning Please rest, ice, elevate - toes above nose Please avoid strenuous activity for the next couple of days Please continue to monitor symptoms closely and if symptoms are to worsen or change (fever greater than 101, chills, sweating, nausea, vomiting, chest pain, shortness of breathe, difficulty breathing, weakness, numbness, tingling, worsening or changes to pain pattern, fall, injury, loss of sensation, changes to skin colored, toes feel cold to the touch, blue/red/white changes to skin colored, swelling to the leg, Richard returning of the legs) please report back to the Emergency Department immediately.    Foot Sprain The muscles and cord like structures which attach muscle to bone (tendons) that surround the feet are made up of units. A foot sprain can occur at the weakest spot in any of these units. This condition is most often caused by injury to or overuse of the foot, as from playing contact sports, or aggravating a previous injury, or from poor conditioning, or obesity. SYMPTOMS  Pain with movement of the foot.  Tenderness and swelling at the injury site.  Loss of strength is present in moderate or severe sprains. THE THREE GRADES OR SEVERITY OF FOOT SPRAIN ARE:  Mild (Grade I): Slightly pulled muscle without tearing of muscle or tendon fibers or loss of strength.  Moderate (Grade II): Tearing of fibers in a muscle, tendon, or at the  attachment to bone, with small decrease in strength.  Severe (Grade III): Rupture of the muscle-tendon-bone attachment, with separation of fibers. Severe sprain requires surgical repair. Often repeating (chronic) sprains are caused by overuse. Sudden (acute) sprains are caused by direct injury or over-use. DIAGNOSIS  Diagnosis of this condition is usually by your own observation. If problems continue, a caregiver may be required for further evaluation and treatment. X-rays may be required to make sure there are not breaks in the bones (fractures) present. Continued problems may require physical therapy for treatment. PREVENTION  Use strength and conditioning exercises appropriate for your sport.  Warm up properly prior to working out.  Use athletic shoes that are made for the sport you are participating in.  Allow adequate time for healing. Early return to activities makes repeat injury more likely, and can lead to an unstable arthritic foot that can result in prolonged disability. Mild sprains generally heal in 3 to 10 days, with moderate and severe sprains taking 2 to 10 weeks. Your caregiver can help you determine the proper time required for healing. HOME CARE INSTRUCTIONS   Apply ice to the injury for 15-20 minutes, 03-04 times per day. Put the ice in a plastic bag and place a towel between the bag of ice and your skin.  An elastic wrap (like an Ace bandage) may be used to keep swelling down.  Keep foot above the level of the heart, or at least raised on a footstool, when swelling and pain are present.  Try to avoid use other than gentle range of motion while the foot  is painful. Do not resume use until instructed by your caregiver. Then begin use gradually, not increasing use to the point of pain. If pain does develop, decrease use and continue the above measures, gradually increasing activities that do not cause discomfort, until you gradually achieve normal use.  Use crutches if and  as instructed, and for the length of time instructed.  Keep injured foot and ankle wrapped between treatments.  Massage foot and ankle for comfort and to keep swelling down. Massage from the toes up towards the knee.  Only take over-the-counter or prescription medicines for pain, discomfort, or fever as directed by your caregiver. SEEK IMMEDIATE MEDICAL CARE IF:   Your pain and swelling increase, or pain is not controlled with medications.  You have loss of feeling in your foot or your foot turns cold or blue.  You develop new, unexplained symptoms, or an increase of the symptoms that brought you to your caregiver. MAKE SURE YOU:   Understand these instructions.  Will watch your condition.  Will get help right away if you are not doing well or get worse. Document Released: 06/27/2001 Document Revised: 03/30/2011 Document Reviewed: 08/25/2007 [facility] Patient Information 2015 [place], [place]. This information is not intended to replace advice given to you by your health care provider. Make sure you discuss any questions you have with your health care provider.

## 2014-05-08 NOTE — ED Notes (Signed)
MD at bedside. 

## 2014-05-08 NOTE — ED Notes (Signed)
Pt c/o sudden onset  left foot pain x 4 hrs ago , sent here from PMD office for eval DVT, Toradol 60 mg IM PTA

## 2014-05-18 DIAGNOSIS — I10 Essential (primary) hypertension: Secondary | ICD-10-CM | POA: Diagnosis not present

## 2014-05-18 DIAGNOSIS — K219 Gastro-esophageal reflux disease without esophagitis: Secondary | ICD-10-CM | POA: Diagnosis not present

## 2014-05-18 DIAGNOSIS — M79673 Pain in unspecified foot: Secondary | ICD-10-CM | POA: Diagnosis not present

## 2014-06-04 DIAGNOSIS — K219 Gastro-esophageal reflux disease without esophagitis: Secondary | ICD-10-CM | POA: Diagnosis not present

## 2014-06-04 DIAGNOSIS — Z800 Family history of malignant neoplasm of digestive organs: Secondary | ICD-10-CM | POA: Diagnosis not present

## 2014-06-07 DIAGNOSIS — I83811 Varicose veins of right lower extremities with pain: Secondary | ICD-10-CM | POA: Diagnosis not present

## 2014-06-07 DIAGNOSIS — I8311 Varicose veins of right lower extremity with inflammation: Secondary | ICD-10-CM | POA: Diagnosis not present

## 2014-06-08 DIAGNOSIS — H524 Presbyopia: Secondary | ICD-10-CM | POA: Diagnosis not present

## 2014-06-08 DIAGNOSIS — D3131 Benign neoplasm of right choroid: Secondary | ICD-10-CM | POA: Diagnosis not present

## 2014-06-08 DIAGNOSIS — D3132 Benign neoplasm of left choroid: Secondary | ICD-10-CM | POA: Diagnosis not present

## 2014-06-19 DIAGNOSIS — J45901 Unspecified asthma with (acute) exacerbation: Secondary | ICD-10-CM | POA: Diagnosis not present

## 2014-07-12 DIAGNOSIS — M1711 Unilateral primary osteoarthritis, right knee: Secondary | ICD-10-CM | POA: Diagnosis not present

## 2014-08-01 DIAGNOSIS — N958 Other specified menopausal and perimenopausal disorders: Secondary | ICD-10-CM | POA: Diagnosis not present

## 2014-08-06 ENCOUNTER — Other Ambulatory Visit: Payer: Self-pay | Admitting: Gastroenterology

## 2014-08-06 DIAGNOSIS — Z1211 Encounter for screening for malignant neoplasm of colon: Secondary | ICD-10-CM | POA: Diagnosis not present

## 2014-08-06 DIAGNOSIS — Z800 Family history of malignant neoplasm of digestive organs: Secondary | ICD-10-CM | POA: Diagnosis not present

## 2014-08-06 DIAGNOSIS — K5730 Diverticulosis of large intestine without perforation or abscess without bleeding: Secondary | ICD-10-CM | POA: Diagnosis not present

## 2014-08-06 DIAGNOSIS — D123 Benign neoplasm of transverse colon: Secondary | ICD-10-CM | POA: Diagnosis not present

## 2014-08-06 DIAGNOSIS — K640 First degree hemorrhoids: Secondary | ICD-10-CM | POA: Diagnosis not present

## 2014-08-06 DIAGNOSIS — D126 Benign neoplasm of colon, unspecified: Secondary | ICD-10-CM | POA: Diagnosis not present

## 2014-08-06 DIAGNOSIS — Z8371 Family history of colonic polyps: Secondary | ICD-10-CM | POA: Diagnosis not present

## 2014-09-07 ENCOUNTER — Other Ambulatory Visit: Payer: Self-pay

## 2014-09-07 DIAGNOSIS — Z1231 Encounter for screening mammogram for malignant neoplasm of breast: Secondary | ICD-10-CM

## 2014-10-17 ENCOUNTER — Ambulatory Visit: Payer: Medicare Other

## 2014-11-07 ENCOUNTER — Ambulatory Visit: Payer: Medicare Other

## 2014-11-28 DIAGNOSIS — N390 Urinary tract infection, site not specified: Secondary | ICD-10-CM | POA: Diagnosis not present

## 2014-11-28 DIAGNOSIS — R8290 Unspecified abnormal findings in urine: Secondary | ICD-10-CM | POA: Diagnosis not present

## 2014-11-28 DIAGNOSIS — R7309 Other abnormal glucose: Secondary | ICD-10-CM | POA: Diagnosis not present

## 2014-11-28 DIAGNOSIS — E784 Other hyperlipidemia: Secondary | ICD-10-CM | POA: Diagnosis not present

## 2014-12-05 DIAGNOSIS — M179 Osteoarthritis of knee, unspecified: Secondary | ICD-10-CM | POA: Diagnosis not present

## 2014-12-05 DIAGNOSIS — F325 Major depressive disorder, single episode, in full remission: Secondary | ICD-10-CM | POA: Diagnosis not present

## 2014-12-05 DIAGNOSIS — Z23 Encounter for immunization: Secondary | ICD-10-CM | POA: Diagnosis not present

## 2014-12-05 DIAGNOSIS — E784 Other hyperlipidemia: Secondary | ICD-10-CM | POA: Diagnosis not present

## 2014-12-05 DIAGNOSIS — R7309 Other abnormal glucose: Secondary | ICD-10-CM | POA: Diagnosis not present

## 2014-12-05 DIAGNOSIS — Z1389 Encounter for screening for other disorder: Secondary | ICD-10-CM | POA: Diagnosis not present

## 2014-12-05 DIAGNOSIS — I10 Essential (primary) hypertension: Secondary | ICD-10-CM | POA: Diagnosis not present

## 2014-12-05 DIAGNOSIS — I728 Aneurysm of other specified arteries: Secondary | ICD-10-CM | POA: Diagnosis not present

## 2014-12-05 DIAGNOSIS — Z800 Family history of malignant neoplasm of digestive organs: Secondary | ICD-10-CM | POA: Diagnosis not present

## 2014-12-05 DIAGNOSIS — J45909 Unspecified asthma, uncomplicated: Secondary | ICD-10-CM | POA: Diagnosis not present

## 2014-12-05 DIAGNOSIS — Z6833 Body mass index (BMI) 33.0-33.9, adult: Secondary | ICD-10-CM | POA: Diagnosis not present

## 2014-12-05 DIAGNOSIS — Z86010 Personal history of colonic polyps: Secondary | ICD-10-CM | POA: Diagnosis not present

## 2014-12-12 ENCOUNTER — Ambulatory Visit
Admission: RE | Admit: 2014-12-12 | Discharge: 2014-12-12 | Disposition: A | Discharge status: Home / Self Care | Payer: Medicare Other | Source: Ambulatory Visit

## 2014-12-12 DIAGNOSIS — Z1231 Encounter for screening mammogram for malignant neoplasm of breast: Secondary | ICD-10-CM | POA: Diagnosis not present

## 2014-12-21 DIAGNOSIS — M25561 Pain in right knee: Secondary | ICD-10-CM | POA: Diagnosis not present

## 2014-12-21 DIAGNOSIS — L570 Actinic keratosis: Secondary | ICD-10-CM | POA: Diagnosis not present

## 2014-12-21 DIAGNOSIS — H61001 Unspecified perichondritis of right external ear: Secondary | ICD-10-CM | POA: Diagnosis not present

## 2014-12-21 DIAGNOSIS — M1711 Unilateral primary osteoarthritis, right knee: Secondary | ICD-10-CM | POA: Diagnosis not present

## 2014-12-27 DIAGNOSIS — I83813 Varicose veins of bilateral lower extremities with pain: Secondary | ICD-10-CM | POA: Diagnosis not present

## 2015-01-15 DIAGNOSIS — I10 Essential (primary) hypertension: Secondary | ICD-10-CM | POA: Diagnosis not present

## 2015-01-15 DIAGNOSIS — Z6834 Body mass index (BMI) 34.0-34.9, adult: Secondary | ICD-10-CM | POA: Diagnosis not present

## 2015-01-15 DIAGNOSIS — R42 Dizziness and giddiness: Secondary | ICD-10-CM | POA: Diagnosis not present

## 2015-01-15 DIAGNOSIS — R609 Edema, unspecified: Secondary | ICD-10-CM | POA: Diagnosis not present

## 2015-02-04 DIAGNOSIS — M25561 Pain in right knee: Secondary | ICD-10-CM | POA: Diagnosis not present

## 2015-02-04 DIAGNOSIS — M1711 Unilateral primary osteoarthritis, right knee: Secondary | ICD-10-CM | POA: Diagnosis not present

## 2015-02-04 DIAGNOSIS — M2241 Chondromalacia patellae, right knee: Secondary | ICD-10-CM | POA: Diagnosis not present

## 2015-02-12 ENCOUNTER — Ambulatory Visit (INDEPENDENT_AMBULATORY_CARE_PROVIDER_SITE_OTHER): Payer: Medicare Other | Admitting: Allergy and Immunology

## 2015-02-12 ENCOUNTER — Encounter: Payer: Self-pay | Admitting: Allergy and Immunology

## 2015-02-12 ENCOUNTER — Encounter (INDEPENDENT_AMBULATORY_CARE_PROVIDER_SITE_OTHER): Payer: Self-pay

## 2015-02-12 VITALS — BP 148/86 | HR 80 | Resp 16 | Ht 61.81 in | Wt 183.0 lb

## 2015-02-12 DIAGNOSIS — J387 Other diseases of larynx: Secondary | ICD-10-CM | POA: Diagnosis not present

## 2015-02-12 DIAGNOSIS — J4540 Moderate persistent asthma, uncomplicated: Secondary | ICD-10-CM | POA: Diagnosis not present

## 2015-02-12 DIAGNOSIS — H1010 Acute atopic conjunctivitis, unspecified eye: Secondary | ICD-10-CM

## 2015-02-12 DIAGNOSIS — J309 Allergic rhinitis, unspecified: Secondary | ICD-10-CM | POA: Diagnosis not present

## 2015-02-12 DIAGNOSIS — K219 Gastro-esophageal reflux disease without esophagitis: Secondary | ICD-10-CM

## 2015-02-12 MED ORDER — FLUTICASONE FUROATE 100 MCG/ACT IN AEPB: 1.0000 | INHALATION_SPRAY | Freq: Every day | RESPIRATORY_TRACT | Status: AC

## 2015-02-12 NOTE — Progress Notes (Signed)
[facility]  Follow-up Note  Referring Provider: Loraine Hunt.,* Primary Provider: Idamae Schuller, MD Date of Office Visit: 02/12/2015  Subjective:   Tasha Hunt is a 70 y.o. female who returns to the [facility] on 02/12/2015 in re-evaluation of the following:  HPI Comments: Tasha Hunt returns to this clinic in reevaluation of her asthma and allergic rhinitis and LPR. I've not seen her in his clinic in about 8 months and overall she is done quite well.  Asthma has not been a particularly big issue is long as [name] continues to use her inhaled steroid. She is not required a systemic steroid to treat an exacerbation of her asthma in the past 8 months. She has not had a need to use a short acting bronchodilator and she does not have any exercise-induced bronchospastic symptoms although she is somewhat limited regarding exercise secondary to a knee problem which she is going to have addressed with injection of collagen tomorrow and possibly a total knee replacement in the future. Her insurance company will no longer pay for her Qvar.  Her nose is been doing quite well as long she continues to use Flonase 2 sprays each nostril one time per day. She has not had any significant episodes of sinusitis and she does not have any anosmia or headaches.  Reflux is not been a particularly big issue is long she continues to use Dexilant 60 mg in the morning. On occasion she will add in omeprazole at nighttime but this is not very common.   Current Outpatient Prescriptions on File Prior to Visit  Medication Sig Dispense Refill  . ALPRAZolam (XANAX) 0.25 MG tablet Take 0.5 mg by mouth every 4 (four) hours as needed. For anxiety    . BIOTIN PO Take 1 capsule by mouth daily.     . calcium carbonate (OS-CAL) 600 MG TABS Take 600 mg by mouth 2 (two) times daily with a meal.    . cetirizine (ZYRTEC) 10 MG tablet Take 10 mg  by mouth daily.    . Cholecalciferol (VITAMIN D3) 2000 UNITS TABS Take 1 capsule by mouth daily.     Marland Kitchen DEXILANT 60 MG capsule Take 60 mg by mouth daily.     . fish oil-omega-3 fatty acids 1000 MG capsule Take 1 g by mouth daily.    . fluticasone (FLONASE) 50 MCG/ACT nasal spray Place 2 sprays into the nose daily.    . Glucosamine-MSM-Hyaluronic Acd 750-375-30 MG TABS Take 3 capsules by mouth daily.      No current facility-administered medications on file prior to visit.    Meds ordered this encounter  Medications  . Fluticasone Furoate (ARNUITY ELLIPTA) 100 MCG/ACT AEPB    Sig: Inhale 1 Dose into the lungs daily.    Dispense:  3 each    Refill:  1    Past Medical History  Diagnosis Date  . Allergic rhinitis, cause unspecified   . Hypertension   . Esophageal reflux   . Obesity, unspecified   . Depressive disorder, not elsewhere classified   . Aneurysm of splenic artery ([place])     noted since 2002  . Arthritis     hands  . Pneumonia     03/27/11 "many many years ago"  . H/O hiatal hernia   . Anxiety     Past Surgical History  Procedure Laterality Date  . Nasal septum surgery  1985    deviated septum  .  Tonsillectomy  1948  . Cholecystectomy  2002  . Abdominal hysterectomy  1980  . Breast biopsy  2003    right  . Breast biopsy  2012    left  . Arterial aneurysm repair  03/27/11    splenic artery  . Laparoscopic nissen fundoplication  [id number]  . Embolization N/A 03/27/2011    Procedure: EMBOLIZATION;  Surgeon: Elam Dutch, MD;  Location: [facility] CATH LAB;  Service: Cardiovascular;  Laterality: N/A;    Allergies  Allergen Reactions  . Codeine Nausea Only  . Morphine And Related Nausea And Vomiting    Review of systems negative except as noted in HPI / PMHx or noted below:  Review of Systems  Constitutional: Negative.   HENT: Negative.   Eyes: Negative.   Respiratory: Negative.   Cardiovascular: Negative.   Gastrointestinal: Negative.   Genitourinary: Negative.    Musculoskeletal: Negative.   Skin: Negative.   Neurological: Negative.   Endo/Heme/Allergies: Negative.   Psychiatric/Behavioral: Negative.      Objective:   Filed Vitals:   02/12/15 1018  BP: 148/86  Pulse: 80  Resp: 16   Height: 5' 1.81" (157 cm)  Weight: 182 lb 15.7 oz (83 kg)   Physical Exam  Constitutional: She is well-developed, well-nourished, and in no distress. No distress.  HENT:  Head: Normocephalic.  Right Ear: Tympanic membrane, external ear and ear canal normal.  Left Ear: Tympanic membrane, external ear and ear canal normal.  Nose: Nose normal. No mucosal edema or rhinorrhea.  Mouth/Throat: Uvula is midline, oropharynx is clear and moist and mucous membranes are normal. No oropharyngeal exudate.  Eyes: Conjunctivae are normal.  Neck: Trachea normal. No tracheal tenderness present. No tracheal deviation present. No thyromegaly present.  Cardiovascular: Normal rate, regular rhythm, S1 normal, S2 normal and normal heart sounds.   No murmur heard. Pulmonary/Chest: Breath sounds normal. No stridor. No respiratory distress. She has no wheezes. She has no rales.  Musculoskeletal: She exhibits no edema.  Lymphadenopathy:       Head (right side): No tonsillar adenopathy present.       Head (left side): No tonsillar adenopathy present.    She has no cervical adenopathy.    She has no axillary adenopathy.  Neurological: She is alert. Gait normal.  Skin: No rash noted. She is not diaphoretic. No erythema. Nails show no clubbing.  Psychiatric: Mood and affect normal.    Diagnostics:    Spirometry was performed and demonstrated an FEV1 of 1.53 at 75 % of predicted.  The patient had an Asthma Control Test with the following results: ACT Total Score: 24.    Assessment and Plan:   1. Asthma, moderate persistent, well-controlled   2. Allergic rhinoconjunctivitis   3. LPRD (laryngopharyngeal reflux disease)     1. Arnuity 100 one inhalation 1 time per day.  Replace her Qvar  2. "Action plan" - increase Arnuity to 2 inhalations 3 times a day  3. Continue Dexilant 60 mg in the morning and add in nighttime omeprazole if needed  4. Continue Flonase 2 sprays each nostril one time per day  5. Continue ProAir HFA 2 puffs every 4-6 hours if needed  6. Continue over-the-counter antihistamine if needed  7. Return to clinic in 6 months or earlier if problem  Overall Faylene is doing quite well. I did change her Qvar to a formulary covered medication and we will continue to have her use an inhaled steroid on a regular basis and continue to aggressively treat  her reflux and her upper airway disease as stated above. I've given her an action plan to initiate should she develop an asthma flare at some point in the near future. If she does well I will see her back in this clinic and possibly 6 months or earlier if there is a problem.     Allena Katz, MD [place]

## 2015-02-12 NOTE — Patient Instructions (Addendum)
  1. Arnuity 100 one inhalation 1 time per day. Replace her Qvar  2. "Action plan" - increase Arnuity to 2 inhalations 3 times a day  3. Continue Dexilant 60 mg in the morning and add in nighttime omeprazole if needed  4. Continue Flonase 2 sprays each nostril one time per day  5. Continue ProAir HFA 2 puffs every 4-6 hours if needed  6. Continue over-the-counter antihistamine if needed  7. Return to clinic in 6 months or earlier if problem

## 2015-02-13 DIAGNOSIS — M1711 Unilateral primary osteoarthritis, right knee: Secondary | ICD-10-CM | POA: Diagnosis not present

## 2015-02-15 DIAGNOSIS — Z6834 Body mass index (BMI) 34.0-34.9, adult: Secondary | ICD-10-CM | POA: Diagnosis not present

## 2015-02-15 DIAGNOSIS — M179 Osteoarthritis of knee, unspecified: Secondary | ICD-10-CM | POA: Diagnosis not present

## 2015-02-15 DIAGNOSIS — R002 Palpitations: Secondary | ICD-10-CM | POA: Diagnosis not present

## 2015-02-15 DIAGNOSIS — I10 Essential (primary) hypertension: Secondary | ICD-10-CM | POA: Diagnosis not present

## 2015-02-19 DIAGNOSIS — M1711 Unilateral primary osteoarthritis, right knee: Secondary | ICD-10-CM | POA: Diagnosis not present

## 2015-02-25 DIAGNOSIS — M1711 Unilateral primary osteoarthritis, right knee: Secondary | ICD-10-CM | POA: Diagnosis not present

## 2015-02-25 DIAGNOSIS — M25561 Pain in right knee: Secondary | ICD-10-CM | POA: Diagnosis not present

## 2015-02-27 ENCOUNTER — Ambulatory Visit (INDEPENDENT_AMBULATORY_CARE_PROVIDER_SITE_OTHER): Payer: Medicare Other

## 2015-02-27 DIAGNOSIS — R002 Palpitations: Secondary | ICD-10-CM | POA: Diagnosis not present

## 2015-04-17 DIAGNOSIS — M25561 Pain in right knee: Secondary | ICD-10-CM | POA: Diagnosis not present

## 2015-04-17 DIAGNOSIS — M2241 Chondromalacia patellae, right knee: Secondary | ICD-10-CM | POA: Diagnosis not present

## 2015-04-17 DIAGNOSIS — M1711 Unilateral primary osteoarthritis, right knee: Secondary | ICD-10-CM | POA: Diagnosis not present

## 2015-04-18 DIAGNOSIS — L814 Other melanin hyperpigmentation: Secondary | ICD-10-CM | POA: Diagnosis not present

## 2015-04-18 DIAGNOSIS — D2272 Melanocytic nevi of left lower limb, including hip: Secondary | ICD-10-CM | POA: Diagnosis not present

## 2015-04-18 DIAGNOSIS — L821 Other seborrheic keratosis: Secondary | ICD-10-CM | POA: Diagnosis not present

## 2015-04-18 DIAGNOSIS — D2262 Melanocytic nevi of left upper limb, including shoulder: Secondary | ICD-10-CM | POA: Diagnosis not present

## 2015-04-18 DIAGNOSIS — D225 Melanocytic nevi of trunk: Secondary | ICD-10-CM | POA: Diagnosis not present

## 2015-04-18 DIAGNOSIS — I788 Other diseases of capillaries: Secondary | ICD-10-CM | POA: Diagnosis not present

## 2015-04-18 DIAGNOSIS — D2261 Melanocytic nevi of right upper limb, including shoulder: Secondary | ICD-10-CM | POA: Diagnosis not present

## 2015-04-18 DIAGNOSIS — L918 Other hypertrophic disorders of the skin: Secondary | ICD-10-CM | POA: Diagnosis not present

## 2015-05-02 ENCOUNTER — Ambulatory Visit (INDEPENDENT_AMBULATORY_CARE_PROVIDER_SITE_OTHER): Payer: Medicare Other | Admitting: Ophthalmology

## 2015-05-16 ENCOUNTER — Ambulatory Visit (INDEPENDENT_AMBULATORY_CARE_PROVIDER_SITE_OTHER): Payer: Medicare Other | Admitting: Ophthalmology

## 2015-05-16 DIAGNOSIS — H43813 Vitreous degeneration, bilateral: Secondary | ICD-10-CM | POA: Diagnosis not present

## 2015-05-16 DIAGNOSIS — D3131 Benign neoplasm of right choroid: Secondary | ICD-10-CM | POA: Diagnosis not present

## 2015-05-16 DIAGNOSIS — H35033 Hypertensive retinopathy, bilateral: Secondary | ICD-10-CM | POA: Diagnosis not present

## 2015-05-16 DIAGNOSIS — I10 Essential (primary) hypertension: Secondary | ICD-10-CM | POA: Diagnosis not present

## 2015-05-21 DIAGNOSIS — K219 Gastro-esophageal reflux disease without esophagitis: Secondary | ICD-10-CM | POA: Diagnosis not present

## 2015-05-29 DIAGNOSIS — I10 Essential (primary) hypertension: Secondary | ICD-10-CM | POA: Diagnosis not present

## 2015-05-29 DIAGNOSIS — Z6834 Body mass index (BMI) 34.0-34.9, adult: Secondary | ICD-10-CM | POA: Diagnosis not present

## 2015-05-29 DIAGNOSIS — M170 Bilateral primary osteoarthritis of knee: Secondary | ICD-10-CM | POA: Diagnosis not present

## 2015-05-29 DIAGNOSIS — K219 Gastro-esophageal reflux disease without esophagitis: Secondary | ICD-10-CM | POA: Diagnosis not present

## 2015-05-29 DIAGNOSIS — M179 Osteoarthritis of knee, unspecified: Secondary | ICD-10-CM | POA: Diagnosis not present

## 2015-05-29 DIAGNOSIS — R7309 Other abnormal glucose: Secondary | ICD-10-CM | POA: Diagnosis not present

## 2015-05-29 DIAGNOSIS — E784 Other hyperlipidemia: Secondary | ICD-10-CM | POA: Diagnosis not present

## 2015-05-29 DIAGNOSIS — R002 Palpitations: Secondary | ICD-10-CM | POA: Diagnosis not present

## 2015-06-14 DIAGNOSIS — H524 Presbyopia: Secondary | ICD-10-CM | POA: Diagnosis not present

## 2015-06-14 DIAGNOSIS — H5213 Myopia, bilateral: Secondary | ICD-10-CM | POA: Diagnosis not present

## 2015-06-14 DIAGNOSIS — H35033 Hypertensive retinopathy, bilateral: Secondary | ICD-10-CM | POA: Diagnosis not present

## 2015-06-14 DIAGNOSIS — H04123 Dry eye syndrome of bilateral lacrimal glands: Secondary | ICD-10-CM | POA: Diagnosis not present

## 2015-06-14 DIAGNOSIS — D3131 Benign neoplasm of right choroid: Secondary | ICD-10-CM | POA: Diagnosis not present

## 2015-07-10 DIAGNOSIS — M2241 Chondromalacia patellae, right knee: Secondary | ICD-10-CM | POA: Diagnosis not present

## 2015-07-10 DIAGNOSIS — M1711 Unilateral primary osteoarthritis, right knee: Secondary | ICD-10-CM | POA: Diagnosis not present

## 2015-07-10 DIAGNOSIS — M25561 Pain in right knee: Secondary | ICD-10-CM | POA: Diagnosis not present

## 2015-08-06 ENCOUNTER — Ambulatory Visit: Payer: Medicare Other | Admitting: Allergy and Immunology

## 2015-08-28 DIAGNOSIS — M1711 Unilateral primary osteoarthritis, right knee: Secondary | ICD-10-CM | POA: Diagnosis not present

## 2015-09-05 DIAGNOSIS — M1711 Unilateral primary osteoarthritis, right knee: Secondary | ICD-10-CM | POA: Diagnosis not present

## 2015-09-10 ENCOUNTER — Ambulatory Visit: Payer: Medicare Other | Admitting: Allergy and Immunology

## 2015-09-11 DIAGNOSIS — L820 Inflamed seborrheic keratosis: Secondary | ICD-10-CM | POA: Diagnosis not present

## 2015-09-13 DIAGNOSIS — M1711 Unilateral primary osteoarthritis, right knee: Secondary | ICD-10-CM | POA: Diagnosis not present

## 2015-09-20 DIAGNOSIS — N952 Postmenopausal atrophic vaginitis: Secondary | ICD-10-CM | POA: Diagnosis not present

## 2015-09-30 ENCOUNTER — Other Ambulatory Visit: Payer: Self-pay | Admitting: Internal Medicine

## 2015-09-30 ENCOUNTER — Ambulatory Visit
Admission: RE | Admit: 2015-09-30 | Discharge: 2015-09-30 | Disposition: A | Discharge status: Home / Self Care | Payer: Medicare Other | Source: Ambulatory Visit | Attending: Internal Medicine | Admitting: Internal Medicine

## 2015-09-30 DIAGNOSIS — M170 Bilateral primary osteoarthritis of knee: Secondary | ICD-10-CM | POA: Diagnosis not present

## 2015-09-30 DIAGNOSIS — Z23 Encounter for immunization: Secondary | ICD-10-CM | POA: Diagnosis not present

## 2015-09-30 DIAGNOSIS — W19XXXA Unspecified fall, initial encounter: Secondary | ICD-10-CM

## 2015-09-30 DIAGNOSIS — S0990XA Unspecified injury of head, initial encounter: Secondary | ICD-10-CM | POA: Diagnosis not present

## 2015-09-30 DIAGNOSIS — Z6834 Body mass index (BMI) 34.0-34.9, adult: Secondary | ICD-10-CM | POA: Diagnosis not present

## 2015-10-06 DIAGNOSIS — J45901 Unspecified asthma with (acute) exacerbation: Secondary | ICD-10-CM | POA: Diagnosis not present

## 2015-10-06 DIAGNOSIS — J019 Acute sinusitis, unspecified: Secondary | ICD-10-CM | POA: Diagnosis not present

## 2015-11-11 ENCOUNTER — Other Ambulatory Visit: Payer: Self-pay | Admitting: Internal Medicine

## 2015-11-11 DIAGNOSIS — Z1231 Encounter for screening mammogram for malignant neoplasm of breast: Secondary | ICD-10-CM

## 2015-11-19 DIAGNOSIS — K219 Gastro-esophageal reflux disease without esophagitis: Secondary | ICD-10-CM | POA: Diagnosis not present

## 2015-12-02 DIAGNOSIS — I10 Essential (primary) hypertension: Secondary | ICD-10-CM | POA: Diagnosis not present

## 2015-12-02 DIAGNOSIS — E784 Other hyperlipidemia: Secondary | ICD-10-CM | POA: Diagnosis not present

## 2015-12-02 DIAGNOSIS — M25561 Pain in right knee: Secondary | ICD-10-CM | POA: Diagnosis not present

## 2015-12-02 DIAGNOSIS — M2241 Chondromalacia patellae, right knee: Secondary | ICD-10-CM | POA: Diagnosis not present

## 2015-12-02 DIAGNOSIS — M1711 Unilateral primary osteoarthritis, right knee: Secondary | ICD-10-CM | POA: Diagnosis not present

## 2015-12-02 DIAGNOSIS — R7309 Other abnormal glucose: Secondary | ICD-10-CM | POA: Diagnosis not present

## 2015-12-02 DIAGNOSIS — G8929 Other chronic pain: Secondary | ICD-10-CM | POA: Diagnosis not present

## 2015-12-09 DIAGNOSIS — I10 Essential (primary) hypertension: Secondary | ICD-10-CM | POA: Diagnosis not present

## 2015-12-09 DIAGNOSIS — Z86010 Personal history of colonic polyps: Secondary | ICD-10-CM | POA: Diagnosis not present

## 2015-12-09 DIAGNOSIS — R7309 Other abnormal glucose: Secondary | ICD-10-CM | POA: Diagnosis not present

## 2015-12-09 DIAGNOSIS — F325 Major depressive disorder, single episode, in full remission: Secondary | ICD-10-CM | POA: Diagnosis not present

## 2015-12-09 DIAGNOSIS — J45909 Unspecified asthma, uncomplicated: Secondary | ICD-10-CM | POA: Diagnosis not present

## 2015-12-09 DIAGNOSIS — R42 Dizziness and giddiness: Secondary | ICD-10-CM | POA: Diagnosis not present

## 2015-12-09 DIAGNOSIS — Z1389 Encounter for screening for other disorder: Secondary | ICD-10-CM | POA: Diagnosis not present

## 2015-12-09 DIAGNOSIS — Z6834 Body mass index (BMI) 34.0-34.9, adult: Secondary | ICD-10-CM | POA: Diagnosis not present

## 2015-12-09 DIAGNOSIS — Z0000 Encounter for general adult medical examination without abnormal findings: Secondary | ICD-10-CM | POA: Diagnosis not present

## 2015-12-09 DIAGNOSIS — Z800 Family history of malignant neoplasm of digestive organs: Secondary | ICD-10-CM | POA: Diagnosis not present

## 2015-12-09 DIAGNOSIS — M170 Bilateral primary osteoarthritis of knee: Secondary | ICD-10-CM | POA: Diagnosis not present

## 2015-12-09 DIAGNOSIS — E784 Other hyperlipidemia: Secondary | ICD-10-CM | POA: Diagnosis not present

## 2015-12-17 ENCOUNTER — Ambulatory Visit
Admission: RE | Admit: 2015-12-17 | Discharge: 2015-12-17 | Disposition: A | Discharge status: Home / Self Care | Payer: Medicare Other | Source: Ambulatory Visit | Attending: Internal Medicine | Admitting: Internal Medicine

## 2015-12-17 DIAGNOSIS — Z1231 Encounter for screening mammogram for malignant neoplasm of breast: Secondary | ICD-10-CM

## 2015-12-30 DIAGNOSIS — Z1212 Encounter for screening for malignant neoplasm of rectum: Secondary | ICD-10-CM | POA: Diagnosis not present

## 2016-01-04 DIAGNOSIS — M542 Cervicalgia: Secondary | ICD-10-CM | POA: Diagnosis not present

## 2016-01-04 DIAGNOSIS — M546 Pain in thoracic spine: Secondary | ICD-10-CM | POA: Diagnosis not present

## 2016-01-04 DIAGNOSIS — S0990XA Unspecified injury of head, initial encounter: Secondary | ICD-10-CM | POA: Diagnosis not present

## 2016-01-06 DIAGNOSIS — Z6834 Body mass index (BMI) 34.0-34.9, adult: Secondary | ICD-10-CM | POA: Diagnosis not present

## 2016-01-06 DIAGNOSIS — J209 Acute bronchitis, unspecified: Secondary | ICD-10-CM | POA: Diagnosis not present

## 2016-01-06 DIAGNOSIS — J45998 Other asthma: Secondary | ICD-10-CM | POA: Diagnosis not present

## 2016-01-06 DIAGNOSIS — J45909 Unspecified asthma, uncomplicated: Secondary | ICD-10-CM | POA: Diagnosis not present

## 2016-01-09 ENCOUNTER — Other Ambulatory Visit: Payer: Self-pay

## 2016-01-09 DIAGNOSIS — I729 Aneurysm of unspecified site: Secondary | ICD-10-CM

## 2016-02-21 DIAGNOSIS — M1711 Unilateral primary osteoarthritis, right knee: Secondary | ICD-10-CM | POA: Diagnosis not present

## 2016-02-21 DIAGNOSIS — M2241 Chondromalacia patellae, right knee: Secondary | ICD-10-CM | POA: Diagnosis not present

## 2016-02-21 DIAGNOSIS — M25561 Pain in right knee: Secondary | ICD-10-CM | POA: Diagnosis not present

## 2016-02-21 DIAGNOSIS — G8929 Other chronic pain: Secondary | ICD-10-CM | POA: Diagnosis not present

## 2016-02-21 DIAGNOSIS — L304 Erythema intertrigo: Secondary | ICD-10-CM | POA: Diagnosis not present

## 2016-03-04 DIAGNOSIS — L304 Erythema intertrigo: Secondary | ICD-10-CM | POA: Diagnosis not present

## 2016-03-05 DIAGNOSIS — Z124 Encounter for screening for malignant neoplasm of cervix: Secondary | ICD-10-CM | POA: Diagnosis not present

## 2016-03-05 DIAGNOSIS — Z1272 Encounter for screening for malignant neoplasm of vagina: Secondary | ICD-10-CM | POA: Diagnosis not present

## 2016-03-05 DIAGNOSIS — Z90712 Acquired absence of cervix with remaining uterus: Secondary | ICD-10-CM | POA: Diagnosis not present

## 2016-03-05 DIAGNOSIS — Z6834 Body mass index (BMI) 34.0-34.9, adult: Secondary | ICD-10-CM | POA: Diagnosis not present

## 2016-03-24 ENCOUNTER — Encounter (HOSPITAL_COMMUNITY): Payer: Self-pay | Admitting: *Deleted

## 2016-03-27 DIAGNOSIS — M1711 Unilateral primary osteoarthritis, right knee: Secondary | ICD-10-CM | POA: Diagnosis not present

## 2016-04-09 NOTE — Patient Instructions (Addendum)
[facility]  04/09/2016   Your procedure is scheduled on: Monday 04-20-16  Report to [place] .  Go to Short Stay on the First Floor .  Use Map.   Call this number if you have problems the morning of surgery [phone]   Remember: ONLY 1 PERSON MAY GO WITH YOU TO SHORT STAY TO GET  READY MORNING OF YOUR SURGERY.  Do not eat food or drink liquids :After Midnight.     Take these medicines the morning of surgery with A SIP OF WATER: Alprazolam (Xanax) if needed, , Cetirizine (Zyrtec), Diltiazem (Dilt-XR), Fluoxetine  (Prozac)., Dexilant, Flonase .   Please bring and use any inhaler that you may need.                                  You may not have any metal on your body including hair pins and              piercings  Do not wear jewelry, make-up, lotions, powders or perfumes, deodorant             Do not wear nail polish.  Do not shave  48 hours prior to surgery.             Do not bring valuables to the hospital. [place].  Contacts, dentures or bridgework may not be worn into surgery.  Leave suitcase in the car. After surgery it may be brought to your room.                  Please read over the following fact sheets you were given: _____________________________________________________________________             [facility] - Preparing for Surgery Before surgery, you can play an important role.  Because skin is not sterile, your skin needs to be as free of germs as possible.  You can reduce the number of germs on your skin by washing with CHG (chlorahexidine gluconate) soap before surgery.  CHG is an antiseptic cleaner which kills germs and bonds with the skin to continue killing germs even after washing. Please DO NOT use if you have an allergy to CHG or antibacterial soaps.  If your skin becomes reddened/irritated stop using the CHG and inform your nurse when you arrive at  Short Stay. Do not shave (including legs and underarms) for at least 48 hours prior to the first CHG shower.  You may shave your face/neck. Please follow these instructions carefully:  1.  Shower with CHG Soap the night before surgery and the  morning of Surgery.  2.  If you choose to wash your hair, wash your hair first as usual with your  normal  shampoo.  3.  After you shampoo, rinse your hair and body thoroughly to remove the  shampoo.                           4.  Use CHG as you would any other liquid soap.  You can apply chg directly  to the skin and wash  Gently with a scrungie or clean washcloth.  5.  Apply the CHG Soap to your body ONLY FROM THE NECK DOWN.   Do not use on face/ open                           Wound or open sores. Avoid contact with eyes, ears mouth and genitals (private parts).                       Wash face,  Genitals (private parts) with your normal soap.             6.  Wash thoroughly, paying special attention to the area where your surgery  will be performed.  7.  Thoroughly rinse your body with warm water from the neck down.  8.  DO NOT shower/wash with your normal soap after using and rinsing off  the CHG Soap.                9.  Pat yourself dry with a clean towel.            10.  Wear clean pajamas.            11.  Place clean sheets on your bed the night of your first shower and do not  sleep with pets. Day of Surgery : Do not apply any lotions/deodorants the morning of surgery.  Please wear clean clothes to the hospital/surgery center.  FAILURE TO FOLLOW THESE INSTRUCTIONS MAY RESULT IN THE CANCELLATION OF YOUR SURGERY PATIENT SIGNATURE_________________________________  NURSE SIGNATURE__________________________________  ________________________________________________________________________   Adam Phenix  An incentive spirometer is a tool that can help keep your lungs clear and active. This tool measures how well you are  filling your lungs with each breath. Taking long deep breaths may help reverse or decrease the chance of developing breathing (pulmonary) problems (especially infection) following:  A long period of time when you are unable to move or be active. BEFORE THE PROCEDURE   If the spirometer includes an indicator to show your best effort, your nurse or respiratory therapist will set it to a desired goal.  If possible, sit up straight or lean slightly forward. Try not to slouch.  Hold the incentive spirometer in an upright position. INSTRUCTIONS FOR USE  1. Sit on the edge of your bed if possible, or sit up as far as you can in bed or on a chair. 2. Hold the incentive spirometer in an upright position. 3. Breathe out normally. 4. Place the mouthpiece in your mouth and seal your lips tightly around it. 5. Breathe in slowly and as deeply as possible, raising the piston or the ball toward the top of the column. 6. Hold your breath for 3-5 seconds or for as long as possible. Allow the piston or ball to fall to the bottom of the column. 7. Remove the mouthpiece from your mouth and breathe out normally. 8. Rest for a few seconds and repeat Steps 1 through 7 at least 10 times every 1-2 hours when you are awake. Take your time and take a few normal breaths between deep breaths. 9. The spirometer may include an indicator to show your best effort. Use the indicator as a goal to work toward during each repetition. 10. After each set of 10 deep breaths, practice coughing to be sure your lungs are clear. If you have an incision (the cut made at the time of surgery),  support your incision when coughing by placing a pillow or rolled up towels firmly against it. Once you are able to get out of bed, walk around indoors and cough well. You may stop using the incentive spirometer when instructed by your caregiver.  RISKS AND COMPLICATIONS  Take your time so you do not get dizzy or light-headed.  If you are in pain,  you may need to take or ask for pain medication before doing incentive spirometry. It is harder to take a deep breath if you are having pain. AFTER USE  Rest and breathe slowly and easily.  It can be helpful to keep track of a log of your progress. Your caregiver can provide you with a simple table to help with this. If you are using the spirometer at home, follow these instructions: [place] IF:   You are having difficultly using the spirometer.  You have trouble using the spirometer as often as instructed.  Your pain medication is not giving enough relief while using the spirometer.  You develop fever of 100.5 F (38.1 C) or higher. SEEK IMMEDIATE MEDICAL CARE IF:   You cough up bloody sputum that had not been present before.  You develop fever of 102 F (38.9 C) or greater.  You develop worsening pain at or near the incision site. MAKE SURE YOU:   Understand these instructions.  Will watch your condition.  Will get help right away if you are not doing well or get worse. Document Released: 05/18/2006 Document Revised: 03/30/2011 Document Reviewed: 07/19/2006 Exit[employer] Patient Information 2014 Exit[employer], [place].   ________________________________________________________________________  WHAT IS A BLOOD TRANSFUSION? Blood Transfusion Information  A transfusion is the replacement of blood or some of its parts. Blood is made up of multiple cells which provide different functions.  Red blood cells carry oxygen and are used for blood loss replacement.  White blood cells fight against infection.  Platelets control bleeding.  Plasma helps clot blood.  Other blood products are available for specialized needs, such as hemophilia or other clotting disorders. BEFORE THE TRANSFUSION  Who gives blood for transfusions?   Healthy volunteers who are fully evaluated to make sure their blood is safe. This is blood bank blood. Transfusion therapy is the safest it has ever  been in the practice of medicine. Before blood is taken from a donor, a complete history is taken to make sure that person has no history of diseases nor engages in risky social behavior (examples are intravenous drug use or sexual activity with multiple partners). The donor's travel history is screened to minimize risk of transmitting infections, such as malaria. The donated blood is tested for signs of infectious diseases, such as HIV and hepatitis. The blood is then tested to be sure it is compatible with you in order to minimize the chance of a transfusion reaction. If you or a relative donates blood, this is often done in anticipation of surgery and is not appropriate for emergency situations. It takes many days to process the donated blood. RISKS AND COMPLICATIONS Although transfusion therapy is very safe and saves many lives, the main dangers of transfusion include:   Getting an infectious disease.  Developing a transfusion reaction. This is an allergic reaction to something in the blood you were given. Every precaution is taken to prevent this. The decision to have a blood transfusion has been considered carefully by your caregiver before blood is given. Blood is not given unless the benefits outweigh the risks. AFTER THE TRANSFUSION  Right after receiving a blood transfusion, you will usually feel much better and more energetic. This is especially true if your red blood cells have gotten low (anemic). The transfusion raises the level of the red blood cells which carry oxygen, and this usually causes an energy increase.  The nurse administering the transfusion will monitor you carefully for complications. HOME CARE INSTRUCTIONS  No special instructions are needed after a transfusion. You may find your energy is better. Speak with your caregiver about any limitations on activity for underlying diseases you may have. SEEK MEDICAL CARE IF:   Your condition is not improving after your  transfusion.  You develop redness or irritation at the intravenous (IV) site. SEEK IMMEDIATE MEDICAL CARE IF:  Any of the following symptoms occur over the next 12 hours:  Shaking chills.  You have a temperature by mouth above 102 F (38.9 C), not controlled by medicine.  Chest, back, or muscle pain.  People around you feel you are not acting correctly or are confused.  Shortness of breath or difficulty breathing.  Dizziness and fainting.  You get a rash or develop hives.  You have a decrease in urine output.  Your urine turns a dark color or changes to pink, red, or brown. Any of the following symptoms occur over the next 10 days:  You have a temperature by mouth above 102 F (38.9 C), not controlled by medicine.  Shortness of breath.  Weakness after normal activity.  The white part of the eye turns yellow (jaundice).  You have a decrease in the amount of urine or are urinating less often.  Your urine turns a dark color or changes to pink, red, or brown. Document Released: 01/03/2000 Document Revised: 03/30/2011 Document Reviewed: 08/22/2007 [facility] Patient Information 2014 [place], [place].  _______________________________________________________________________

## 2016-04-10 ENCOUNTER — Encounter: Payer: Self-pay | Admitting: Vascular Surgery

## 2016-04-13 DIAGNOSIS — J449 Chronic obstructive pulmonary disease, unspecified: Secondary | ICD-10-CM | POA: Diagnosis not present

## 2016-04-13 DIAGNOSIS — I10 Essential (primary) hypertension: Secondary | ICD-10-CM | POA: Diagnosis not present

## 2016-04-13 DIAGNOSIS — Z01818 Encounter for other preprocedural examination: Secondary | ICD-10-CM | POA: Diagnosis not present

## 2016-04-13 DIAGNOSIS — Z6834 Body mass index (BMI) 34.0-34.9, adult: Secondary | ICD-10-CM | POA: Diagnosis not present

## 2016-04-13 NOTE — Progress Notes (Signed)
04/13/16 Dr. Zella Richer on chart

## 2016-04-14 ENCOUNTER — Encounter (HOSPITAL_COMMUNITY)
Admission: RE | Admit: 2016-04-14 | Discharge: 2016-04-14 | Disposition: A | Discharge status: Home / Self Care | Payer: Medicare Other | Source: Ambulatory Visit | Attending: Orthopedic Surgery | Admitting: Orthopedic Surgery

## 2016-04-14 ENCOUNTER — Encounter (HOSPITAL_COMMUNITY): Payer: Self-pay

## 2016-04-14 ENCOUNTER — Encounter (INDEPENDENT_AMBULATORY_CARE_PROVIDER_SITE_OTHER): Payer: Self-pay

## 2016-04-14 DIAGNOSIS — Z01818 Encounter for other preprocedural examination: Secondary | ICD-10-CM | POA: Diagnosis not present

## 2016-04-14 DIAGNOSIS — I10 Essential (primary) hypertension: Secondary | ICD-10-CM | POA: Insufficient documentation

## 2016-04-14 DIAGNOSIS — M1711 Unilateral primary osteoarthritis, right knee: Secondary | ICD-10-CM | POA: Diagnosis not present

## 2016-04-14 HISTORY — DX: Other complications of anesthesia, initial encounter: T88.59XA

## 2016-04-14 HISTORY — DX: Other specified postprocedural states: Z98.890

## 2016-04-14 HISTORY — DX: Other specified postprocedural states: R11.2

## 2016-04-14 HISTORY — DX: Adverse effect of unspecified anesthetic, initial encounter: T41.45XA

## 2016-04-14 LAB — SURGICAL PCR SCREEN
MRSA, PCR: NEGATIVE
STAPHYLOCOCCUS AUREUS: NEGATIVE

## 2016-04-14 LAB — BASIC METABOLIC PANEL
Anion gap: 9 (ref 5–15)
BUN: 12 mg/dL (ref 6–20)
CHLORIDE: 100 mmol/L — AB (ref 101–111)
CO2: 29 mmol/L (ref 22–32)
Calcium: 9.4 mg/dL (ref 8.9–10.3)
Creatinine, Ser: 0.57 mg/dL (ref 0.44–1.00)
GFR calc non Af Amer: >60 mL/min (ref >60)
Glucose, Bld: 90 mg/dL (ref 65–99)
POTASSIUM: 3.7 mmol/L (ref 3.5–5.1)
SODIUM: 138 mmol/L (ref 135–145)

## 2016-04-14 LAB — CBC
HEMATOCRIT: 44.8 % (ref 36.0–46.0)
HEMOGLOBIN: 15 g/dL (ref 12.0–15.0)
MCH: 29.4 pg (ref 26.0–34.0)
MCHC: 33.5 g/dL (ref 30.0–36.0)
MCV: 87.8 fL (ref 78.0–100.0)
Platelets: 278 10*3/uL (ref 150–400)
RBC: 5.1 MIL/uL (ref 3.87–5.11)
RDW: 15.8 % — ABNORMAL HIGH (ref 11.5–15.5)
WBC: 12.2 10*3/uL — ABNORMAL HIGH (ref 4.0–10.5)

## 2016-04-14 LAB — ABO/RH: ABO/RH(D): O POS

## 2016-04-14 NOTE — Progress Notes (Signed)
04-13-16 Unconfirmed EKG on Chart

## 2016-04-16 ENCOUNTER — Ambulatory Visit: Payer: Medicare Other | Admitting: Vascular Surgery

## 2016-04-19 NOTE — Anesthesia Preprocedure Evaluation (Addendum)
Anesthesia Evaluation  Patient identified by MRN, date of birth, ID band Patient awake    Reviewed: Allergy & Precautions, NPO status , Patient's Chart, lab work & pertinent test results  History of Anesthesia Complications (+) PONV and history of anesthetic complications  Airway Mallampati: II  TM Distance: >3 FB Neck ROM: Full    Dental  (+) Teeth Intact, Dental Advisory Given   Pulmonary asthma ,    Pulmonary exam normal breath sounds clear to auscultation       Cardiovascular hypertension, Pt. on medications + Peripheral Vascular Disease  Normal cardiovascular exam Rhythm:Regular Rate:Normal     Neuro/Psych PSYCHIATRIC DISORDERS Anxiety Depression negative neurological ROS     GI/Hepatic Neg liver ROS, hiatal hernia, GERD  Medicated and Controlled,  Endo/Other  negative endocrine ROSObesity   Renal/GU negative Renal ROS     Musculoskeletal  (+) Arthritis , Osteoarthritis,    Abdominal   Peds  Hematology negative hematology ROS (+) Plt 278k   Anesthesia Other Findings Day of surgery medications reviewed with the patient.  Reproductive/Obstetrics                           Anesthesia Physical Anesthesia Plan  ASA: II  Anesthesia Plan: Spinal   Post-op Pain Management:  Regional for Post-op pain   Induction: Intravenous  Airway Management Planned: Simple Face Mask  Additional Equipment:   Intra-op Plan:   Post-operative Plan:   Informed Consent: I have reviewed the patients History and Physical, chart, labs and discussed the procedure including the risks, benefits and alternatives for the proposed anesthesia with the patient or authorized representative who has indicated his/her understanding and acceptance.   Dental advisory given  Plan Discussed with: CRNA, Anesthesiologist and Surgeon  Anesthesia Plan Comments:        Anesthesia Quick Evaluation

## 2016-04-19 NOTE — H&P (Signed)
TOTAL KNEE ADMISSION H&P  Patient is being admitted for right total knee arthroplasty.  Subjective:  Chief Complaint:     Right knee primary OA / pain.  HPI: Tasha Hunt, 71 y.o. female, has a history of pain and functional disability in the right knee due to arthritis and has failed non-surgical conservative treatments for greater than 12 weeks to include NSAID's and/or analgesics, corticosteriod injections, viscosupplementation injections and activity modification.  Onset of symptoms was gradual, starting ~4 years ago with gradually worsening course since that time. The patient noted no past surgery on the right knee(s).  Patient currently rates pain in the left knee(s) at 8 out of 10 with activity. Patient has night pain, worsening of pain with activity and weight bearing, pain that interferes with activities of daily living, pain with passive range of motion, crepitus and joint swelling.  Patient has evidence of periarticular osteophytes and joint space narrowing by imaging studies.  There is no active infection.   Risks, benefits and expectations were discussed with the patient.  Risks including but not limited to the risk of anesthesia, blood clots, nerve damage, blood vessel damage, failure of the prosthesis, infection and up to and including death.  Patient understand the risks, benefits and expectations and wishes to proceed with surgery.   PCP: Velna Hatchet, MD  D/C Plans:       Home   Post-op Meds:       No Rx given  Tranexamic Acid:      To be given - IV  topically  Decadron:      Is to be given  FYI:     ASA  Norco  DME:   Rx given for - RW and 3-n-1  PT: OPPT    Patient Active Problem List   Diagnosis Date Noted  . Aneurysm of splenic artery ([place]) 03/19/2011   Past Medical History:  Diagnosis Date  . Allergic rhinitis, cause unspecified   . Aneurysm of splenic artery ([place])    noted since 2002  . Anxiety   . Arthritis    hands  . Complication of  anesthesia   . Depressive disorder, not elsewhere classified   . Esophageal reflux   . H/O hiatal hernia   . Hypertension   . Obesity, unspecified   . Pneumonia    03/27/11 "many many years ago"  . PONV (postoperative nausea and vomiting)     Past Surgical History:  Procedure Laterality Date  . ABDOMINAL HYSTERECTOMY  1980  . ARTERIAL ANEURYSM REPAIR  03/27/11   splenic artery  . BREAST BIOPSY  2003   right  . BREAST BIOPSY  2012   left  . CHOLECYSTECTOMY  2002  . EMBOLIZATION N/A 03/27/2011   Procedure: EMBOLIZATION;  Surgeon: Elam Dutch, MD;  Location: [facility] CATH LAB;  Service: Cardiovascular;  Laterality: N/A;  . LAPAROSCOPIC NISSEN FUNDOPLICATION  1610  . NASAL SEPTUM SURGERY  1985   deviated septum  . TONSILLECTOMY  1948    No prescriptions prior to admission.   Allergies  Allergen Reactions  . Codeine Nausea Only  . Morphine And Related Nausea And Vomiting    Social History  Substance Use Topics  . Smoking status: Never Smoker  . Smokeless tobacco: Never Used  . Alcohol use No    Family History  Problem Relation Age of Onset  . Cancer Mother     melanoma  . Diabetes Mother   . Heart disease Mother   . Hyperlipidemia Mother   .  Diabetes Father   . Hypertension Father   . Cancer Father     prostate  . Heart disease Father   . Heart attack Father   . Cancer Paternal Aunt   . Aneurysm Paternal Aunt     AAA  . Cancer Paternal Grandmother   . Aneurysm Paternal Uncle     AAA     Review of Systems  Constitutional: Negative.   HENT: Negative.   Eyes: Negative.   Respiratory: Negative.   Cardiovascular: Negative.   Gastrointestinal: Positive for heartburn.  Genitourinary: Negative.   Musculoskeletal: Positive for joint pain.  Skin: Negative.   Neurological: Negative.   Endo/Heme/Allergies: Positive for environmental allergies.  Psychiatric/Behavioral: The patient is nervous/anxious.     Objective:  Physical Exam  Constitutional: She is oriented  to person, place, and time. She appears well-developed.  HENT:  Head: Normocephalic.  Eyes: Pupils are equal, round, and reactive to light.  Neck: Neck supple. No JVD present. No tracheal deviation present. No thyromegaly present.  Cardiovascular: Normal rate, regular rhythm and intact distal pulses.   Respiratory: Effort normal and breath sounds normal. No respiratory distress. She has no wheezes.  GI: Soft. There is no tenderness. There is no guarding.  Musculoskeletal:       Right knee: She exhibits decreased range of motion, swelling and bony tenderness. She exhibits no ecchymosis, no deformity, no laceration and no erythema. Tenderness found.  Lymphadenopathy:    She has no cervical adenopathy.  Neurological: She is alert and oriented to person, place, and time.  Skin: Skin is warm and dry.  Psychiatric: She has a normal mood and affect.      Labs:  Estimated body mass index is 34.2 kg/m as calculated from the following:   Height as of 04/14/16: 5\' 2"  (1.575 m).   Weight as of 04/14/16: 84.8 kg (187 lb).   Imaging Review Plain radiographs demonstrate severe degenerative joint disease of the right knee(s). The bone quality appears to be good for age and reported activity level.  Assessment/Plan:  End stage arthritis, right knee   The patient history, physical examination, clinical judgment of the provider and imaging studies are consistent with end stage degenerative joint disease of the right knee(s) and total knee arthroplasty is deemed medically necessary. The treatment options including medical management, injection therapy arthroscopy and arthroplasty were discussed at length. The risks and benefits of total knee arthroplasty were presented and reviewed. The risks due to aseptic loosening, infection, stiffness, patella tracking problems, thromboembolic complications and other imponderables were discussed. The patient acknowledged the explanation, agreed to proceed with the  plan and consent was signed. Patient is being admitted for inpatient treatment for surgery, pain control, PT, OT, prophylactic antibiotics, VTE prophylaxis, progressive ambulation and ADL's and discharge planning. The patient is planning to be discharged home.     [place] Delvonte Berenson   PA-C  04/19/2016, 8:53 PM

## 2016-04-20 ENCOUNTER — Inpatient Hospital Stay (HOSPITAL_COMMUNITY): Payer: Medicare Other | Admitting: Anesthesiology

## 2016-04-20 ENCOUNTER — Encounter (HOSPITAL_COMMUNITY): Payer: Self-pay | Admitting: *Deleted

## 2016-04-20 ENCOUNTER — Inpatient Hospital Stay (HOSPITAL_COMMUNITY)
Admission: RE | Admit: 2016-04-20 | Discharge: 2016-04-22 | DRG: 470 | Disposition: A | Discharge status: Home / Self Care | Payer: Medicare Other | Source: Ambulatory Visit | Attending: Orthopedic Surgery | Admitting: Orthopedic Surgery

## 2016-04-20 ENCOUNTER — Encounter (HOSPITAL_COMMUNITY)
Admission: RE | Disposition: A | Discharge status: Home / Self Care | Payer: Self-pay | Source: Ambulatory Visit | Attending: Orthopedic Surgery

## 2016-04-20 DIAGNOSIS — M1711 Unilateral primary osteoarthritis, right knee: Principal | ICD-10-CM | POA: Diagnosis present

## 2016-04-20 DIAGNOSIS — E669 Obesity, unspecified: Secondary | ICD-10-CM | POA: Diagnosis present

## 2016-04-20 DIAGNOSIS — Z96659 Presence of unspecified artificial knee joint: Secondary | ICD-10-CM

## 2016-04-20 DIAGNOSIS — Z808 Family history of malignant neoplasm of other organs or systems: Secondary | ICD-10-CM | POA: Diagnosis not present

## 2016-04-20 DIAGNOSIS — I739 Peripheral vascular disease, unspecified: Secondary | ICD-10-CM | POA: Diagnosis present

## 2016-04-20 DIAGNOSIS — Z8042 Family history of malignant neoplasm of prostate: Secondary | ICD-10-CM | POA: Diagnosis not present

## 2016-04-20 DIAGNOSIS — Z6834 Body mass index (BMI) 34.0-34.9, adult: Secondary | ICD-10-CM

## 2016-04-20 DIAGNOSIS — M659 Synovitis and tenosynovitis, unspecified: Secondary | ICD-10-CM | POA: Diagnosis present

## 2016-04-20 DIAGNOSIS — I728 Aneurysm of other specified arteries: Secondary | ICD-10-CM | POA: Diagnosis not present

## 2016-04-20 DIAGNOSIS — Z8249 Family history of ischemic heart disease and other diseases of the circulatory system: Secondary | ICD-10-CM | POA: Diagnosis not present

## 2016-04-20 DIAGNOSIS — I10 Essential (primary) hypertension: Secondary | ICD-10-CM | POA: Diagnosis present

## 2016-04-20 DIAGNOSIS — K219 Gastro-esophageal reflux disease without esophagitis: Secondary | ICD-10-CM | POA: Diagnosis present

## 2016-04-20 DIAGNOSIS — K449 Diaphragmatic hernia without obstruction or gangrene: Secondary | ICD-10-CM | POA: Diagnosis present

## 2016-04-20 DIAGNOSIS — G8918 Other acute postprocedural pain: Secondary | ICD-10-CM | POA: Diagnosis not present

## 2016-04-20 HISTORY — PX: TOTAL KNEE ARTHROPLASTY: SHX125

## 2016-04-20 LAB — TYPE AND SCREEN
ABO/RH(D): O POS
Antibody Screen: NEGATIVE

## 2016-04-20 SURGERY — ARTHROPLASTY, KNEE, TOTAL
Anesthesia: Spinal | Site: Knee | Laterality: Right

## 2016-04-20 MED ORDER — ALPRAZOLAM 0.5 MG PO TABS
0.5000 mg | ORAL_TABLET | Freq: Two times a day (BID) | ORAL | Status: DC | PRN
  Administered 2016-04-20: 22:00:00 0.5 mg via ORAL
  Filled 2016-04-20: qty 1

## 2016-04-20 MED ORDER — PHENYLEPHRINE HCL 10 MG/ML IJ SOLN
INTRAMUSCULAR | Status: DC | PRN
  Administered 2016-04-20 (×4): 40 ug via INTRAVENOUS

## 2016-04-20 MED ORDER — BUPIVACAINE HCL (PF) 0.25 % IJ SOLN
INTRAMUSCULAR | Status: DC | PRN
  Administered 2016-04-20: 30 mL

## 2016-04-20 MED ORDER — PANTOPRAZOLE SODIUM 40 MG PO TBEC
40.0000 mg | DELAYED_RELEASE_TABLET | Freq: Every day | ORAL | Status: DC
  Administered 2016-04-20 – 2016-04-21 (×2): 40 mg via ORAL
  Filled 2016-04-20 (×2): qty 1

## 2016-04-20 MED ORDER — ONDANSETRON HCL 4 MG/2ML IJ SOLN
INTRAMUSCULAR | Status: AC
  Filled 2016-04-20: qty 2

## 2016-04-20 MED ORDER — CHLORHEXIDINE GLUCONATE 4 % EX LIQD: 60.0000 mL | Freq: Once | CUTANEOUS | Status: DC

## 2016-04-20 MED ORDER — SODIUM CHLORIDE 0.9 % IR SOLN
Status: DC | PRN
  Administered 2016-04-20: 1000 mL

## 2016-04-20 MED ORDER — ALBUTEROL SULFATE (2.5 MG/3ML) 0.083% IN NEBU: 3.0000 mL | INHALATION_SOLUTION | RESPIRATORY_TRACT | Status: DC | PRN

## 2016-04-20 MED ORDER — HYDROCODONE-ACETAMINOPHEN 7.5-325 MG PO TABS
1.0000 | ORAL_TABLET | ORAL | Status: DC
  Administered 2016-04-20 (×2): 2 via ORAL
  Administered 2016-04-20 (×2): 1 via ORAL
  Administered 2016-04-21 – 2016-04-22 (×6): 2 via ORAL
  Filled 2016-04-20 (×3): qty 2
  Filled 2016-04-20: qty 1
  Filled 2016-04-20 (×5): qty 2
  Filled 2016-04-20: qty 1
  Filled 2016-04-20: qty 2
  Filled 2016-04-20: qty 1

## 2016-04-20 MED ORDER — PHENOL 1.4 % MT LIQD: 1.0000 | OROMUCOSAL | Status: DC | PRN

## 2016-04-20 MED ORDER — CEFAZOLIN SODIUM-DEXTROSE 2-4 GM/100ML-% IV SOLN: 2.0000 g | INTRAVENOUS | Status: DC

## 2016-04-20 MED ORDER — FLUTICASONE PROPIONATE 50 MCG/ACT NA SUSP
2.0000 | Freq: Every day | NASAL | Status: DC
  Administered 2016-04-21 – 2016-04-22 (×2): 2 via NASAL
  Filled 2016-04-20: qty 16

## 2016-04-20 MED ORDER — FERROUS SULFATE 325 (65 FE) MG PO TABS: 325.0000 mg | ORAL_TABLET | Freq: Three times a day (TID) | ORAL | Status: DC

## 2016-04-20 MED ORDER — SODIUM CHLORIDE 0.9 % IV SOLN
1000.0000 mg | INTRAVENOUS | Status: AC
  Administered 2016-04-20: 1000 mg via INTRAVENOUS
  Filled 2016-04-20: qty 1100

## 2016-04-20 MED ORDER — DOCUSATE SODIUM 100 MG PO CAPS
100.0000 mg | ORAL_CAPSULE | Freq: Two times a day (BID) | ORAL | Status: DC
  Administered 2016-04-20 – 2016-04-22 (×5): 100 mg via ORAL
  Filled 2016-04-20 (×5): qty 1

## 2016-04-20 MED ORDER — ONDANSETRON HCL 4 MG PO TABS: 4.0000 mg | ORAL_TABLET | Freq: Four times a day (QID) | ORAL | Status: DC | PRN

## 2016-04-20 MED ORDER — LORATADINE 10 MG PO TABS
10.0000 mg | ORAL_TABLET | Freq: Every day | ORAL | Status: DC
  Administered 2016-04-21 – 2016-04-22 (×2): 10 mg via ORAL
  Filled 2016-04-20 (×2): qty 1

## 2016-04-20 MED ORDER — LACTATED RINGERS IV SOLN
INTRAVENOUS | Status: DC | PRN
  Administered 2016-04-20 (×2): via INTRAVENOUS

## 2016-04-20 MED ORDER — BISACODYL 10 MG RE SUPP: 10.0000 mg | Freq: Every day | RECTAL | Status: DC | PRN

## 2016-04-20 MED ORDER — DEXAMETHASONE SODIUM PHOSPHATE 10 MG/ML IJ SOLN
10.0000 mg | Freq: Once | INTRAMUSCULAR | Status: AC
  Administered 2016-04-21: 10 mg via INTRAVENOUS
  Filled 2016-04-20: qty 1

## 2016-04-20 MED ORDER — METOCLOPRAMIDE HCL 5 MG PO TABS: 5.0000 mg | ORAL_TABLET | Freq: Three times a day (TID) | ORAL | Status: DC | PRN

## 2016-04-20 MED ORDER — SODIUM CHLORIDE 0.9 % IJ SOLN
INTRAMUSCULAR | Status: AC
  Filled 2016-04-20: qty 50

## 2016-04-20 MED ORDER — METOCLOPRAMIDE HCL 5 MG/ML IJ SOLN: 5.0000 mg | Freq: Three times a day (TID) | INTRAMUSCULAR | Status: DC | PRN

## 2016-04-20 MED ORDER — POLYETHYLENE GLYCOL 3350 17 G PO PACK
17.0000 g | PACK | Freq: Two times a day (BID) | ORAL | Status: DC
  Administered 2016-04-20 – 2016-04-22 (×4): 17 g via ORAL
  Filled 2016-04-20 (×4): qty 1

## 2016-04-20 MED ORDER — DOCUSATE SODIUM 100 MG PO CAPS: 100.0000 mg | ORAL_CAPSULE | Freq: Two times a day (BID) | ORAL | 0 refills | Status: DC

## 2016-04-20 MED ORDER — CEFAZOLIN SODIUM-DEXTROSE 2-4 GM/100ML-% IV SOLN
2.0000 g | Freq: Four times a day (QID) | INTRAVENOUS | Status: AC
  Administered 2016-04-20 (×2): 2 g via INTRAVENOUS
  Filled 2016-04-20 (×2): qty 100

## 2016-04-20 MED ORDER — ONDANSETRON HCL 4 MG/2ML IJ SOLN: 4.0000 mg | Freq: Once | INTRAMUSCULAR | Status: DC | PRN

## 2016-04-20 MED ORDER — PROPOFOL 10 MG/ML IV BOLUS
INTRAVENOUS | Status: AC
  Filled 2016-04-20: qty 40

## 2016-04-20 MED ORDER — EPHEDRINE 5 MG/ML INJ
INTRAVENOUS | Status: AC
  Filled 2016-04-20: qty 10

## 2016-04-20 MED ORDER — DIPHENHYDRAMINE HCL 25 MG PO CAPS: 25.0000 mg | ORAL_CAPSULE | Freq: Four times a day (QID) | ORAL | Status: DC | PRN

## 2016-04-20 MED ORDER — KETOROLAC TROMETHAMINE 30 MG/ML IJ SOLN
INTRAMUSCULAR | Status: DC | PRN
  Administered 2016-04-20: 30 mg

## 2016-04-20 MED ORDER — ALUM & MAG HYDROXIDE-SIMETH 200-200-20 MG/5ML PO SUSP: 15.0000 mL | ORAL | Status: DC | PRN

## 2016-04-20 MED ORDER — FENTANYL CITRATE (PF) 100 MCG/2ML IJ SOLN
INTRAMUSCULAR | Status: DC | PRN
  Administered 2016-04-20: 50 ug via INTRAVENOUS

## 2016-04-20 MED ORDER — HYDROCODONE-ACETAMINOPHEN 7.5-325 MG PO TABS: 1.0000 | ORAL_TABLET | ORAL | 0 refills | Status: DC | PRN

## 2016-04-20 MED ORDER — SODIUM CHLORIDE 0.9 % IV SOLN
INTRAVENOUS | Status: DC
  Administered 2016-04-20 – 2016-04-21 (×2): via INTRAVENOUS
  Filled 2016-04-20 (×7): qty 1000

## 2016-04-20 MED ORDER — DEXAMETHASONE SODIUM PHOSPHATE 10 MG/ML IJ SOLN
INTRAMUSCULAR | Status: AC
  Filled 2016-04-20: qty 1

## 2016-04-20 MED ORDER — ONDANSETRON HCL 4 MG/2ML IJ SOLN: 4.0000 mg | Freq: Four times a day (QID) | INTRAMUSCULAR | Status: DC | PRN

## 2016-04-20 MED ORDER — MAGNESIUM CITRATE PO SOLN: 1.0000 | Freq: Once | ORAL | Status: DC | PRN

## 2016-04-20 MED ORDER — PROPOFOL 10 MG/ML IV BOLUS
INTRAVENOUS | Status: AC
  Filled 2016-04-20: qty 20

## 2016-04-20 MED ORDER — ONDANSETRON HCL 4 MG/2ML IJ SOLN
INTRAMUSCULAR | Status: DC | PRN
  Administered 2016-04-20: 4 mg via INTRAVENOUS

## 2016-04-20 MED ORDER — HYDROMORPHONE HCL 1 MG/ML IJ SOLN
0.5000 mg | INTRAMUSCULAR | Status: DC | PRN
  Administered 2016-04-20 (×2): 1 mg via INTRAVENOUS
  Filled 2016-04-20 (×2): qty 1

## 2016-04-20 MED ORDER — BUPIVACAINE-EPINEPHRINE (PF) 0.5% -1:200000 IJ SOLN
INTRAMUSCULAR | Status: DC | PRN
  Administered 2016-04-20: 20 mL via PERINEURAL

## 2016-04-20 MED ORDER — MENTHOL 3 MG MT LOZG: 1.0000 | LOZENGE | OROMUCOSAL | Status: DC | PRN

## 2016-04-20 MED ORDER — METHOCARBAMOL 500 MG PO TABS
500.0000 mg | ORAL_TABLET | Freq: Four times a day (QID) | ORAL | Status: DC | PRN
  Administered 2016-04-20 – 2016-04-21 (×2): 500 mg via ORAL
  Filled 2016-04-20 (×2): qty 1

## 2016-04-20 MED ORDER — ASPIRIN 81 MG PO CHEW
81.0000 mg | CHEWABLE_TABLET | Freq: Two times a day (BID) | ORAL | Status: DC
  Administered 2016-04-20 – 2016-04-22 (×4): 81 mg via ORAL
  Filled 2016-04-20 (×4): qty 1

## 2016-04-20 MED ORDER — BUPIVACAINE IN DEXTROSE 0.75-8.25 % IT SOLN
INTRATHECAL | Status: DC | PRN
  Administered 2016-04-20: 2 mL via INTRATHECAL

## 2016-04-20 MED ORDER — DEXTROSE 5 % IV SOLN
500.0000 mg | Freq: Four times a day (QID) | INTRAVENOUS | Status: DC | PRN
  Administered 2016-04-20: 12:00:00 500 mg via INTRAVENOUS
  Filled 2016-04-20: qty 5
  Filled 2016-04-20: qty 550

## 2016-04-20 MED ORDER — BUPIVACAINE HCL (PF) 0.25 % IJ SOLN
INTRAMUSCULAR | Status: AC
  Filled 2016-04-20: qty 30

## 2016-04-20 MED ORDER — DILTIAZEM HCL ER 180 MG PO CP24
180.0000 mg | ORAL_CAPSULE | Freq: Every day | ORAL | Status: DC
  Administered 2016-04-21 – 2016-04-22 (×2): 180 mg via ORAL
  Filled 2016-04-20 (×4): qty 1

## 2016-04-20 MED ORDER — ASPIRIN 81 MG PO CHEW: 81.0000 mg | CHEWABLE_TABLET | Freq: Two times a day (BID) | ORAL | 0 refills | Status: AC

## 2016-04-20 MED ORDER — KETOROLAC TROMETHAMINE 30 MG/ML IJ SOLN
INTRAMUSCULAR | Status: AC
  Filled 2016-04-20: qty 1

## 2016-04-20 MED ORDER — FENTANYL CITRATE (PF) 100 MCG/2ML IJ SOLN
INTRAMUSCULAR | Status: AC
  Filled 2016-04-20: qty 2

## 2016-04-20 MED ORDER — FLUOXETINE HCL 20 MG PO TABS
20.0000 mg | ORAL_TABLET | Freq: Every day | ORAL | Status: DC
  Administered 2016-04-21 – 2016-04-22 (×2): 20 mg via ORAL
  Filled 2016-04-20 (×4): qty 1

## 2016-04-20 MED ORDER — LIDOCAINE 2% (20 MG/ML) 5 ML SYRINGE
INTRAMUSCULAR | Status: AC
  Filled 2016-04-20: qty 5

## 2016-04-20 MED ORDER — CEFAZOLIN SODIUM-DEXTROSE 2-4 GM/100ML-% IV SOLN
INTRAVENOUS | Status: AC
  Filled 2016-04-20: qty 100

## 2016-04-20 MED ORDER — MIDAZOLAM HCL 2 MG/2ML IJ SOLN
INTRAMUSCULAR | Status: AC
  Filled 2016-04-20: qty 2

## 2016-04-20 MED ORDER — ONDANSETRON HCL 4 MG PO TABS: 4.0000 mg | ORAL_TABLET | Freq: Three times a day (TID) | ORAL | 0 refills | Status: DC | PRN

## 2016-04-20 MED ORDER — POLYETHYLENE GLYCOL 3350 17 G PO PACK: 17.0000 g | PACK | Freq: Two times a day (BID) | ORAL | 0 refills | Status: DC

## 2016-04-20 MED ORDER — FENTANYL CITRATE (PF) 100 MCG/2ML IJ SOLN: 25.0000 ug | INTRAMUSCULAR | Status: DC | PRN

## 2016-04-20 MED ORDER — PROPOFOL 10 MG/ML IV BOLUS
INTRAVENOUS | Status: DC | PRN
  Administered 2016-04-20: 10 mg via INTRAVENOUS

## 2016-04-20 MED ORDER — LIDOCAINE 2% (20 MG/ML) 5 ML SYRINGE
INTRAMUSCULAR | Status: DC | PRN
  Administered 2016-04-20: 40 mg via INTRAVENOUS

## 2016-04-20 MED ORDER — DEXAMETHASONE SODIUM PHOSPHATE 10 MG/ML IJ SOLN
10.0000 mg | Freq: Once | INTRAMUSCULAR | Status: AC
  Administered 2016-04-20: 10 mg via INTRAVENOUS

## 2016-04-20 MED ORDER — CEFAZOLIN SODIUM-DEXTROSE 2-3 GM-% IV SOLR
INTRAVENOUS | Status: DC | PRN
  Administered 2016-04-20: 2 g via INTRAVENOUS

## 2016-04-20 MED ORDER — SODIUM CHLORIDE 0.9 % IJ SOLN
INTRAMUSCULAR | Status: DC | PRN
  Administered 2016-04-20: 30 mL

## 2016-04-20 MED ORDER — FERROUS SULFATE 325 (65 FE) MG PO TABS
325.0000 mg | ORAL_TABLET | Freq: Three times a day (TID) | ORAL | Status: DC
  Administered 2016-04-20 – 2016-04-22 (×5): 325 mg via ORAL
  Filled 2016-04-20 (×6): qty 1

## 2016-04-20 MED ORDER — CELECOXIB 200 MG PO CAPS
200.0000 mg | ORAL_CAPSULE | Freq: Two times a day (BID) | ORAL | Status: DC
  Administered 2016-04-20 – 2016-04-22 (×5): 200 mg via ORAL
  Filled 2016-04-20 (×5): qty 1

## 2016-04-20 MED ORDER — PROPOFOL 500 MG/50ML IV EMUL
INTRAVENOUS | Status: DC | PRN
  Administered 2016-04-20: 25 ug/kg/min via INTRAVENOUS

## 2016-04-20 MED ORDER — BUDESONIDE 0.25 MG/2ML IN SUSP
0.2500 mg | Freq: Every day | RESPIRATORY_TRACT | Status: DC
  Administered 2016-04-20 – 2016-04-21 (×2): 0.25 mg via RESPIRATORY_TRACT
  Filled 2016-04-20 (×4): qty 2

## 2016-04-20 MED ORDER — METHOCARBAMOL 500 MG PO TABS: 500.0000 mg | ORAL_TABLET | Freq: Four times a day (QID) | ORAL | 0 refills | Status: DC | PRN

## 2016-04-20 MED ORDER — EPHEDRINE SULFATE 50 MG/ML IJ SOLN
INTRAMUSCULAR | Status: DC | PRN
  Administered 2016-04-20 (×3): 10 mg via INTRAVENOUS

## 2016-04-20 MED ORDER — HYDROCHLOROTHIAZIDE 25 MG PO TABS
12.5000 mg | ORAL_TABLET | Freq: Every day | ORAL | Status: DC
  Administered 2016-04-21 – 2016-04-22 (×2): 12.5 mg via ORAL
  Filled 2016-04-20 (×2): qty 1

## 2016-04-20 SURGICAL SUPPLY — 46 items
ADH SKN CLS APL DERMABOND .7 (GAUZE/BANDAGES/DRESSINGS) ×1
BAG DECANTER FOR FLEXI CONT (MISCELLANEOUS) IMPLANT
BAG SPEC THK2 15X12 ZIP CLS (MISCELLANEOUS)
BAG ZIPLOCK 12X15 (MISCELLANEOUS) IMPLANT
BANDAGE ACE 6X5 VEL STRL LF (GAUZE/BANDAGES/DRESSINGS) ×3 IMPLANT
BLADE SAW SGTL 11.0X1.19X90.0M (BLADE) IMPLANT
BLADE SAW SGTL 13.0X1.19X90.0M (BLADE) ×3 IMPLANT
BOWL SMART MIX CTS (DISPOSABLE) ×3 IMPLANT
CAPT KNEE TOTAL 3 ATTUNE ×2 IMPLANT
CEMENT HV SMART SET (Cement) ×4 IMPLANT
CUFF TOURN SGL QUICK 34 (TOURNIQUET CUFF) ×3
CUFF TRNQT CYL 34X4X40X1 (TOURNIQUET CUFF) ×1 IMPLANT
DECANTER SPIKE VIAL GLASS SM (MISCELLANEOUS) ×3 IMPLANT
DERMABOND ADVANCED (GAUZE/BANDAGES/DRESSINGS) ×2
DERMABOND ADVANCED .7 DNX12 (GAUZE/BANDAGES/DRESSINGS) ×1 IMPLANT
DRAPE U-SHAPE 47X51 STRL (DRAPES) ×3 IMPLANT
DRESSING AQUACEL AG SP 3.5X10 (GAUZE/BANDAGES/DRESSINGS) ×1 IMPLANT
DRSG AQUACEL AG SP 3.5X10 (GAUZE/BANDAGES/DRESSINGS) ×3
DURAPREP 26ML APPLICATOR (WOUND CARE) ×6 IMPLANT
ELECT REM PT RETURN 15FT ADLT (MISCELLANEOUS) ×3 IMPLANT
GLOVE BIOGEL M 7.0 STRL (GLOVE) IMPLANT
GLOVE BIOGEL PI IND STRL 7.5 (GLOVE) ×1 IMPLANT
GLOVE BIOGEL PI IND STRL 8.5 (GLOVE) ×1 IMPLANT
GLOVE BIOGEL PI INDICATOR 7.5 (GLOVE) ×10
GLOVE BIOGEL PI INDICATOR 8.5 (GLOVE) ×2
GLOVE ECLIPSE 8.0 STRL XLNG CF (GLOVE) ×5 IMPLANT
GLOVE ORTHO TXT STRL SZ7.5 (GLOVE) ×6 IMPLANT
GOWN STRL REUS W/TWL LRG LVL3 (GOWN DISPOSABLE) ×7 IMPLANT
GOWN STRL REUS W/TWL XL LVL3 (GOWN DISPOSABLE) ×3 IMPLANT
HANDPIECE INTERPULSE COAX TIP (DISPOSABLE) ×3
MANIFOLD NEPTUNE II (INSTRUMENTS) ×3 IMPLANT
PACK TOTAL KNEE CUSTOM (KITS) ×3 IMPLANT
POSITIONER SURGICAL ARM (MISCELLANEOUS) ×3 IMPLANT
SET HNDPC FAN SPRY TIP SCT (DISPOSABLE) ×1 IMPLANT
SET PAD KNEE POSITIONER (MISCELLANEOUS) ×3 IMPLANT
SUT MNCRL AB 4-0 PS2 18 (SUTURE) ×3 IMPLANT
SUT STRATAFIX 0 PDS 27 VIOLET (SUTURE) ×3
SUT VIC AB 1 CT1 36 (SUTURE) ×3 IMPLANT
SUT VIC AB 2-0 CT1 27 (SUTURE) ×9
SUT VIC AB 2-0 CT1 TAPERPNT 27 (SUTURE) ×3 IMPLANT
SUTURE STRATFX 0 PDS 27 VIOLET (SUTURE) ×1 IMPLANT
SYR 50ML LL SCALE MARK (SYRINGE) ×1 IMPLANT
TRAY FOLEY W/METER SILVER 16FR (SET/KITS/TRAYS/PACK) ×3 IMPLANT
WATER STERILE IRR 1500ML POUR (IV SOLUTION) ×3 IMPLANT
WRAP KNEE MAXI GEL POST OP (GAUZE/BANDAGES/DRESSINGS) ×3 IMPLANT
YANKAUER SUCT BULB TIP 10FT TU (MISCELLANEOUS) ×3 IMPLANT

## 2016-04-20 NOTE — Anesthesia Postprocedure Evaluation (Signed)
Anesthesia Post Note  Patient: Tasha Hunt  Procedure(s) Performed: Procedure(s) (LRB): RIGHT TOTAL KNEE ARTHROPLASTY (Right)  Patient location during evaluation: PACU Anesthesia Type: Spinal Level of consciousness: oriented and awake and alert Pain management: pain level controlled Vital Signs Assessment: post-procedure vital signs reviewed and stable Respiratory status: spontaneous breathing, respiratory function stable and patient connected to nasal cannula oxygen Cardiovascular status: blood pressure returned to baseline and stable Postop Assessment: no headache, no backache, spinal receding, no signs of nausea or vomiting and patient able to bend at knees Anesthetic complications: no       Last Vitals:  Vitals:   04/20/16 1251 04/20/16 1339  BP: 135/64 114/61  Pulse: 84 69  Resp: 20 20  Temp: 36.3 C 36.7 C    Last Pain:  Vitals:   04/20/16 1339  TempSrc: Oral  PainSc:                  Catalina Gravel

## 2016-04-20 NOTE — Discharge Instructions (Signed)

## 2016-04-20 NOTE — Anesthesia Procedure Notes (Signed)
Spinal  Patient location during procedure: OR Start time: 04/20/2016 7:38 AM End time: 04/20/2016 7:41 AM Staffing Anesthesiologist: Catalina Gravel Performed: anesthesiologist  Preanesthetic Checklist Completed: patient identified, surgical consent, pre-op evaluation, timeout performed, IV checked, risks and benefits discussed and monitors and equipment checked Spinal Block Patient position: sitting Prep: site prepped and draped and DuraPrep Patient monitoring: continuous pulse ox and blood pressure Approach: midline Location: L3-4 Needle Needle type: Quincke  Needle gauge: 22 G Assessment Sensory level: T8 Additional Notes Functioning IV was confirmed and monitors were applied. Sterile prep and drape, including hand hygiene, mask and sterile gloves were used. The patient was positioned and the spine was prepped. The skin was anesthetized with lidocaine.  Free flow of clear CSF was obtained prior to injecting local anesthetic into the CSF.  The spinal needle aspirated freely following injection.  The needle was carefully withdrawn.  The patient tolerated the procedure well. Consent was obtained prior to procedure with all questions answered and concerns addressed. Risks including but not limited to bleeding, infection, nerve damage, paralysis, failed block, inadequate analgesia, allergic reaction, high spinal, itching and headache were discussed and the patient wished to proceed.   Hoy Morn, MD

## 2016-04-20 NOTE — Transfer of Care (Addendum)
Immediate Anesthesia Transfer of Care Note  Patient: Tasha Hunt  Procedure(s) Performed: Procedure(s) (LRB): RIGHT TOTAL KNEE ARTHROPLASTY (Right)  Patient Location: PACU  Anesthesia Type: SAB  Level of Consciousness: awake, sedated, patient cooperative and responds to stimulation  Airway & Oxygen Therapy: Patient Spontanous Breathing and Patient connected to [place] O2   Post-op Assessment: Report given to PACU RN, Post -op Vital signs reviewed and stable and Patient moving all extremities  Post vital signs: Reviewed and stable  Complications: No apparent anesthesia complications

## 2016-04-20 NOTE — Interval H&P Note (Signed)
History and Physical Interval Note:  04/20/2016 7:00 AM  Tasha Hunt  has presented today for surgery, with the diagnosis of right knee osteoarthritis  The various methods of treatment have been discussed with the patient and family. After consideration of risks, benefits and other options for treatment, the patient has consented to  Procedure(s): RIGHT TOTAL KNEE ARTHROPLASTY (Right) as a surgical intervention .  The patient's history has been reviewed, patient examined, no change in status, stable for surgery.  I have reviewed the patient's chart and labs.  Questions were answered to the patient's satisfaction.     Mauri Pole

## 2016-04-20 NOTE — Anesthesia Procedure Notes (Addendum)
Procedure Name: MAC Date/Time: 04/20/2016 7:35 AM Performed by: Justice Rocher Pre-anesthesia Checklist: Patient identified, Emergency Drugs available, Suction available, Patient being monitored and Timeout performed Patient Re-evaluated:Patient Re-evaluated prior to inductionOxygen Delivery Method: Simple face mask Preoxygenation: Pre-oxygenation with 100% oxygen Intubation Type: IV induction Placement Confirmation: positive ETCO2 and breath sounds checked- equal and bilateral

## 2016-04-20 NOTE — Op Note (Signed)
NAME:  Tasha Hunt                      MEDICAL RECORD NO.:  [id number]                             FACILITY:  [facility]      PHYSICIAN:  Pietro Cassis. Alvan Dame, M.D.  DATE OF BIRTH:  08-14-1945      DATE OF PROCEDURE:  04/20/2016                                     OPERATIVE REPORT         PREOPERATIVE DIAGNOSIS:  Right knee osteoarthritis.      POSTOPERATIVE DIAGNOSIS:  Right knee osteoarthritis.      FINDINGS:  The patient was noted to have complete loss of cartilage and   bone-on-bone arthritis with associated osteophytes in all three compartments of   the knee worse in the patellofemoral compartment with a significant synovitis and associated effusion.      PROCEDURE:  Right total knee replacement.      COMPONENTS USED:  DePuy Attune rotating platform posterior stabilized knee   system, a size 5N femur, 4 tibia, size 7 PS AOX insert, and 35 anatomic patellar   button.      SURGEON:  Pietro Cassis. Alvan Dame, M.D.      ASSISTANT:  Danae Orleans, PA-C.      ANESTHESIA:  Regional and Spinal.      SPECIMENS:  None.      COMPLICATION:  None.      DRAINS:  None.  EBL: <150cc      TOURNIQUET TIME:   Total Tourniquet Time Documented: Thigh (Right) - 24 minutes Total: Thigh (Right) - 24 minutes  .      The patient was stable to the recovery room.      INDICATION FOR PROCEDURE:  Tasha Hunt is a 71 y.o. female patient of   mine.  The patient had been seen, evaluated, and treated conservatively in the   office with medication, activity modification, and injections.  The patient had   radiographic changes of bone-on-bone arthritis with endplate sclerosis and osteophytes noted.      The patient failed conservative measures including medication, injections, and activity modification, and at this point was ready for more definitive measures.   Based on the radiographic changes and failed conservative measures, the patient   decided to proceed with total knee replacement.  Risks of  infection,   DVT, component failure, need for revision surgery, postop course, and   expectations were all   discussed and reviewed.  Consent was obtained for benefit of pain   relief.      PROCEDURE IN DETAIL:  The patient was brought to the operative theater.   Once adequate anesthesia, preoperative antibiotics, 2 gm of Ancef, 1 gm of Tranexamic Acid, and 10 mg of Decadron administered, the patient was positioned supine with the right thigh tourniquet placed.  The  right lower extremity was prepped and draped in sterile fashion.  A time-   out was performed identifying the patient, planned procedure, and   extremity.      The right lower extremity was placed in the [facility] leg holder.  The leg was   exsanguinated, tourniquet elevated to 250 mmHg.  A midline incision  was   made followed by median parapatellar arthrotomy.  Following initial   exposure, attention was first directed to the patella.  Precut   measurement was noted to be 22 mm.  I resected down to 13-14 mm and used a   35 anatomic patellar button to restore patellar height as well as cover the cut   surface.      The lug holes were drilled and a metal shim was placed to protect the   patella from retractors and saw blades.      At this point, attention was now directed to the femur.  The femoral   canal was opened with a drill, irrigated to try to prevent fat emboli.  An   intramedullary rod was passed at 3 degrees valgus, 9 mm of bone was   resected off the distal femur.  Following this resection, the tibia was   subluxated anteriorly.  Using the extramedullary guide, 2 mm of bone was resected off   the proximal medial tibia.  We confirmed the gap would be   stable medially and laterally with a size 6 spacer block as well as confirmed   the cut was perpendicular in the coronal plane, checking with an alignment rod.      Once this was done, I sized the femur to be a size 5 in the anterior-   posterior dimension, chose a  narrow component based on medial and   lateral dimension.  The size 5 rotation block was then pinned in   position anterior referenced using the C-clamp to set rotation.  The   anterior, posterior, and  chamfer cuts were made without difficulty nor   notching making certain that I was along the anterior cortex to help   with flexion gap stability.      The final box cut was made off the lateral aspect of distal femur.      At this point, the tibia was sized to be a size 4, the size 4 tray was   then pinned in position through the medial third of the tubercle,   drilled, and keel punched.  Trial reduction was now carried with a 5 femur,  4 tibia, a 7 PS insert, and the 35 anatomic patella botton.  The knee was brought to   extension, full extension with good flexion stability with the patella   tracking through the trochlea without application of pressure.  Given   all these findings the femoral lug holes were drilled and then the trial components removed.  Final components were   opened and cement was mixed.  The knee was irrigated with normal saline   solution and pulse lavage.  The synovial lining was   then injected with 30 cc of 0.25% Marcaine with epinephrine and 1 cc of Toradol plus 30 cc of NS for a total of 61 cc.      The knee was irrigated.  Final implants were then cemented onto clean and   dried cut surfaces of bone with the knee brought to extension with a size 7 PS trial insert.      Once the cement had fully cured, the excess cement was removed   throughout the knee.  I confirmed I was satisfied with the range of   motion and stability, and the final size 7 PS AOX insert was chosen.  It was   placed into the knee.      The tourniquet had been let down at 24  minutes.  No significant   hemostasis required.  The   extensor mechanism was then reapproximated using #1 Vicryl with the knee   in flexion.  The   remaining wound was closed with 2-0 Vicryl and running 4-0  Monocryl.   The knee was cleaned, dried, dressed sterilely using Dermabond and   Aquacel dressing.  The patient was then   brought to recovery room in stable condition, tolerating the procedure   well.   Please note that Physician Assistant, Danae Orleans, PA-C, was present for the entirety of the case, and was utilized for pre-operative positioning, peri-operative retractor management, general facilitation of the procedure.  He was also utilized for primary wound closure at the end of the case.              Pietro Cassis Alvan Dame, M.D.    04/20/2016 8:52 AM

## 2016-04-20 NOTE — Evaluation (Signed)
Physical Therapy Evaluation Patient Details Name: Tasha Hunt MRN: [id number] DOB: April 13, 1945 Today's Date: 04/20/2016   History of Present Illness  RTKA  Clinical Impression  The patient is mobilizing well today. Plans  Home with OPPT. Daughter will be with patient only for 1 week. Pt admitted with above diagnosis. Pt currently with functional limitations due to the deficits listed below (see PT Problem List).  Pt will benefit from skilled PT to increase their independence and safety with mobility to allow discharge to the venue listed below.       Follow Up Recommendations Outpatient PT;Supervision/Assistance - 24 hour-patient states that she will hire caregiver if needed.    Equipment Recommendations  None recommended by PT    Recommendations for Other Services       Precautions / Restrictions Precautions Precautions: Fall;Knee      Mobility  Bed Mobility Overal bed mobility: Needs Assistance Bed Mobility: Supine to Sit;Sit to Supine     Supine to sit: Min assist Sit to supine: Min assist   General bed mobility comments: cues for technique, support right leg  Transfers Overall transfer level: Needs assistance Equipment used: Rolling walker (2 wheeled) Transfers: Sit to/from Stand Sit to Stand: Min assist         General transfer comment: cues for hand and right leg position  Ambulation/Gait Ambulation/Gait assistance: Min assist Ambulation Distance (Feet): 50 Feet Assistive device: Rolling walker (2 wheeled) Gait Pattern/deviations: Step-to pattern;Step-through pattern     General Gait Details: cues for sequence and step length, able to step right leg well  Stairs            Wheelchair Mobility    Modified Rankin (Stroke Patients Only)       Balance                                             Pertinent Vitals/Pain Pain Score: 3  Pain Location: rt knee Pain Descriptors / Indicators: Aching Pain Intervention(s):  Monitored during session;Premedicated before session;Repositioned;Ice applied    Home Living Family/patient expects to be discharged to:: Private residence Living Arrangements: Children Available Help at Discharge: Family Type of Home: House Home Access: Level entry     Home Layout: Two level;Able to live on main level with bedroom/bathroom Home Equipment: Gilford Rile - 2 wheels      Prior Function Level of Independence: Independent               Hand Dominance        Extremity/Trunk Assessment   Upper Extremity Assessment Upper Extremity Assessment: Overall WFL for tasks assessed    Lower Extremity Assessment Lower Extremity Assessment: RLE deficits/detail RLE Deficits / Details: able to lift leg from bed    Cervical / Trunk Assessment Cervical / Trunk Assessment: Normal  Communication      Cognition Arousal/Alertness: Awake/alert Behavior During Therapy: WFL for tasks assessed/performed Overall Cognitive Status: Within Functional Limits for tasks assessed                                        General Comments      Exercises     Assessment/Plan    PT Assessment Patient needs continued PT services  PT Problem List Decreased strength;Decreased activity tolerance;Decreased mobility;Decreased knowledge of precautions;Decreased  safety awareness;Decreased knowledge of use of DME       PT Treatment Interventions DME instruction;Gait training;Functional mobility training;Therapeutic activities;Therapeutic exercise;Patient/family education    PT Goals (Current goals can be found in the Care Plan section)  Acute Rehab PT Goals Patient Stated Goal: to wlak without pain PT Goal Formulation: With patient Time For Goal Achievement: 04/24/16 Potential to Achieve Goals: Good    Frequency 7X/week   Barriers to discharge Decreased caregiver support daughter  until next week, then only at night    Co-evaluation               End of  Session   Activity Tolerance: Patient tolerated treatment well Patient left: in bed;with call bell/phone within reach Nurse Communication: Mobility status PT Visit Diagnosis: Difficulty in walking, not elsewhere classified (R26.2);Pain Pain - Right/Left: Right Pain - part of body: Knee    Time: 1914-7829 PT Time Calculation (min) (ACUTE ONLY): 17 min   Charges:   PT Evaluation $PT Eval Low Complexity: 1 Procedure     PT G CodesTresa Endo PT [phone]   Claretha Cooper 04/20/2016, 3:12 PM

## 2016-04-20 NOTE — Anesthesia Procedure Notes (Signed)
Anesthesia Regional Block: Adductor canal block   Pre-Anesthetic Checklist: ,, timeout performed, Correct Patient, Correct Site, Correct Laterality, Correct Procedure, Correct Position, site marked, Risks and benefits discussed,  Surgical consent,  Pre-op evaluation,  At surgeon's request and post-op pain management  Laterality: Right  Prep: chloraprep       Needles:  Injection technique: Single-shot  Needle Type: Echogenic Needle     Needle Length: 9cm  Needle Gauge: 21     Additional Needles:   Procedures: ultrasound guided,,,,,,,,  Narrative:  Start time: 04/20/2016 6:55 AM End time: 04/20/2016 7:00 AM Injection made incrementally with aspirations every 5 mL.  Performed by: Personally  Anesthesiologist: Catalina Gravel  Additional Notes: No pain on injection. No increased resistance to injection. Injection made in 5cc increments.  Good needle visualization.  Patient tolerated procedure well.

## 2016-04-21 LAB — CBC
HEMATOCRIT: 36.6 % (ref 36.0–46.0)
Hemoglobin: 12.1 g/dL (ref 12.0–15.0)
MCH: 29.8 pg (ref 26.0–34.0)
MCHC: 33.1 g/dL (ref 30.0–36.0)
MCV: 90.1 fL (ref 78.0–100.0)
Platelets: 250 10*3/uL (ref 150–400)
RBC: 4.06 MIL/uL (ref 3.87–5.11)
RDW: 15.8 % — ABNORMAL HIGH (ref 11.5–15.5)
WBC: 18.6 10*3/uL — AB (ref 4.0–10.5)

## 2016-04-21 LAB — BASIC METABOLIC PANEL
ANION GAP: 6 (ref 5–15)
BUN: 9 mg/dL (ref 6–20)
CALCIUM: 8.6 mg/dL — AB (ref 8.9–10.3)
CO2: 29 mmol/L (ref 22–32)
Chloride: 103 mmol/L (ref 101–111)
Creatinine, Ser: 0.6 mg/dL (ref 0.44–1.00)
GFR calc Af Amer: >60 mL/min (ref >60)
GFR calc non Af Amer: >60 mL/min (ref >60)
GLUCOSE: 145 mg/dL — AB (ref 65–99)
Potassium: 4.6 mmol/L (ref 3.5–5.1)
Sodium: 138 mmol/L (ref 135–145)

## 2016-04-21 NOTE — Progress Notes (Signed)
qPhysical Therapy Treatment Patient Details Name: Tasha Hunt MRN: [id number] DOB: January 13, 1946 Today's Date: 04/21/2016    History of Present Illness RTKA    PT Comments    POD # 1 pm session Assisted OOB to amb in hallway.  Assisted to bathroom then back to bed per pt request to rest. Pt plans to D/C to home tomorrow.  Will have a daughter staying with her for one week.      Follow Up Recommendations  Outpatient PT;Supervision/Assistance - 24 hour     Equipment Recommendations  None recommended by PT    Recommendations for Other Services       Precautions / Restrictions Precautions Precautions: Fall;Knee Restrictions Weight Bearing Restrictions: No Other Position/Activity Restrictions: WBAT    Mobility  Bed Mobility Overal bed mobility: Needs Assistance Bed Mobility: Supine to Sit;Sit to Supine     Supine to sit: Supervision Sit to supine: Supervision   General bed mobility comments: assisted OOB and back to bed  Transfers Overall transfer level: Needs assistance Equipment used: Rolling walker (2 wheeled) Transfers: Sit to/from Stand Sit to Stand: Min guard         General transfer comment: cues for hand and right leg position plus safety with turns  Ambulation/Gait Ambulation/Gait assistance: Min guard;Min assist Ambulation Distance (Feet): 34 Feet Assistive device: Rolling walker (2 wheeled) Gait Pattern/deviations: Step-to pattern;Step-through pattern Gait velocity: decreased   General Gait Details: 25% cues for sequence and step length, able to step right leg well   Stairs            Wheelchair Mobility    Modified Rankin (Stroke Patients Only)       Balance                                            Cognition Arousal/Alertness: Awake/alert Behavior During Therapy: WFL for tasks assessed/performed Overall Cognitive Status: Within Functional Limits for tasks assessed                                         Exercises      General Comments        Pertinent Vitals/Pain Pain Assessment: 0-10 Pain Score: 3  Pain Location: R knee Pain Descriptors / Indicators: Operative site guarding;Tender;Sore Pain Intervention(s): Monitored during session;Repositioned;Ice applied    Home Living                      Prior Function            PT Goals (current goals can now be found in the care plan section) Progress towards PT goals: Progressing toward goals    Frequency    7X/week      PT Plan Current plan remains appropriate    Co-evaluation             End of Session Equipment Utilized During Treatment: Gait belt Activity Tolerance: Patient tolerated treatment well Patient left: in chair;with call bell/phone within reach Nurse Communication: Mobility status PT Visit Diagnosis: Difficulty in walking, not elsewhere classified (R26.2);Pain     Time: 0037-0488 PT Time Calculation (min) (ACUTE ONLY): 28 min  Charges:  $Gait Training: 8-22 mins $Therapeutic Activity: 8-22 mins  G Codes:       Rica Koyanagi  PTA [facility]  Acute  Rehab Pager      [phone]

## 2016-04-21 NOTE — Progress Notes (Signed)
qPhysical Therapy Treatment Patient Details Name: Tasha Hunt MRN: [id number] DOB: [birth year] Today's Date: 04/21/2016    History of Present Illness RTKA    PT Comments    POD # 1 am session assisted OOB to amb in hallway then performed some TKR TE's followed by ICE.   Follow Up Recommendations  Outpatient PT;Supervision/Assistance - 24 hour     Equipment Recommendations  None recommended by PT    Recommendations for Other Services       Precautions / Restrictions Precautions Precautions: Fall;Knee Restrictions Weight Bearing Restrictions: No Other Position/Activity Restrictions: WBAT    Mobility  Bed Mobility Overal bed mobility: Needs Assistance Bed Mobility: Supine to Sit     Supine to sit: Supervision     General bed mobility comments: increased time and use of rail  Transfers Overall transfer level: Needs assistance Equipment used: Rolling walker (2 wheeled) Transfers: Sit to/from Stand Sit to Stand: Min guard         General transfer comment: cues for hand and right leg position plus safety with turns  Ambulation/Gait Ambulation/Gait assistance: Min guard;Min assist Ambulation Distance (Feet): 42 Feet Assistive device: Rolling walker (2 wheeled) Gait Pattern/deviations: Step-to pattern;Step-through pattern Gait velocity: decreased   General Gait Details: 25% cues for sequence and step length, able to step right leg well   Stairs            Wheelchair Mobility    Modified Rankin (Stroke Patients Only)       Balance                                            Cognition Arousal/Alertness: Awake/alert Behavior During Therapy: WFL for tasks assessed/performed Overall Cognitive Status: Within Functional Limits for tasks assessed                                        Exercises   Total Knee Replacement TE's 10 reps B LE ankle pumps 10 reps towel squeezes 10 reps knee presses 10 reps heel  slides  10 reps SAQ's 10 reps SLR's 10 reps ABD Followed by ICE    General Comments        Pertinent Vitals/Pain Pain Assessment: 0-10 Pain Score: 3  Pain Location: R knee Pain Descriptors / Indicators: Operative site guarding;Tender;Sore Pain Intervention(s): Monitored during session;Repositioned;Ice applied    Home Living                      Prior Function            PT Goals (current goals can now be found in the care plan section) Progress towards PT goals: Progressing toward goals    Frequency    7X/week      PT Plan Current plan remains appropriate    Co-evaluation             End of Session Equipment Utilized During Treatment: Gait belt Activity Tolerance: Patient tolerated treatment well Patient left: in chair;with call bell/phone within reach Nurse Communication: Mobility status PT Visit Diagnosis: Difficulty in walking, not elsewhere classified (R26.2);Pain     Time: 0300-9233 PT Time Calculation (min) (ACUTE ONLY): 24 min  Charges:  $Gait Training: 8-22 mins $Therapeutic Exercise: 8-22 mins  G Codes:       Rica Koyanagi  PTA [facility]  Acute  Rehab Pager      [phone]

## 2016-04-21 NOTE — Evaluation (Signed)
Occupational Therapy Evaluation Patient Details Name: KANITRA PURIFOY MRN: [id number] DOB: [birth year] Today's Date: 04/21/2016    History of Present Illness RTKA   Clinical Impression   This 71 year old female was admitted for the above sx. All education was completed. No further OT is needed at this time    Follow Up Recommendations  No OT follow up    Equipment Recommendations  None recommended by OT    Recommendations for Other Services       Precautions / Restrictions Precautions Precautions: Fall;Knee Restrictions Weight Bearing Restrictions: No      Mobility Bed Mobility         Supine to sit: Supervision     General bed mobility comments: from flat bed  Transfers   Equipment used: Rolling walker (2 wheeled) Transfers: Sit to/from Stand Sit to Stand: Min guard         General transfer comment: cues for hand and right leg position    Balance                                           ADL either performed or assessed with clinical judgement   ADL Overall ADL's : Needs assistance/impaired Eating/Feeding: Independent   Grooming: Oral care;Supervision/safety;Standing   Upper Body Bathing: Set up;Sitting   Lower Body Bathing: Minimal assistance;Sit to/from stand   Upper Body Dressing : Set up;Sitting   Lower Body Dressing: Minimal assistance;Sit to/from stand   Toilet Transfer: Min guard;Ambulation;Comfort height toilet;RW Armed forces technical officer Details (indicate cue type and reason): has grab bar next to commode at home Toileting- Water quality scientist and Hygiene: Min guard;Sit to/from stand   Tub/ Shower Transfer: Walk-in shower;Min guard;Ambulation     General ADL Comments: pt wears ted hose. She thinks daughter can start over her operated leg.  she is able to don pants without AE. Educated on Scientist, research (life sciences)      Pertinent Vitals/Pain Pain Assessment: Faces Pain Score: 2  Pain  Location: rt knee Pain Descriptors / Indicators: Aching Pain Intervention(s): Limited activity within patient's tolerance;Monitored during session;Premedicated before session;Repositioned     Hand Dominance     Extremity/Trunk Assessment Upper Extremity Assessment Upper Extremity Assessment: Overall WFL for tasks assessed           Communication     Cognition Arousal/Alertness: Awake/alert Behavior During Therapy: WFL for tasks assessed/performed Overall Cognitive Status: Within Functional Limits for tasks assessed                                     General Comments       Exercises     Shoulder Instructions      Home Living Family/patient expects to be discharged to:: Private residence Living Arrangements: Children Available Help at Discharge: Family               Bathroom Shower/Tub: Walk-in shower   Bathroom Toilet: Handicapped height     Home Equipment: Environmental consultant - 2 wheels;Shower seat          Prior Functioning/Environment Level of Independence: Independent                 OT Problem List:  OT Treatment/Interventions:      OT Goals(Current goals can be found in the care plan section) Acute Rehab OT Goals Patient Stated Goal: be independent OT Goal Formulation: All assessment and education complete, DC therapy  OT Frequency:     Barriers to D/C:            Co-evaluation              End of Session    Activity Tolerance: Patient tolerated treatment well Patient left: in chair;with call bell/phone within reach  OT Visit Diagnosis: Pain Pain - Right/Left: Right Pain - part of body: Knee                Time: 2258-3462 OT Time Calculation (min): 20 min Charges:  OT General Charges $OT Visit: 1 Procedure OT Evaluation $OT Eval Low Complexity: 1 Procedure G-Codes:     Lesle Chris, OTR/L [phone] 04/21/2016  Torren Maffeo 04/21/2016, 9:36 AM

## 2016-04-21 NOTE — Care Management Note (Signed)
Case Management Note  Patient Details  Name: Tasha Hunt MRN: [id number] Date of Birth: 05-13-1945  Subjective/Objective:       71 yo admitted for Right total knee replacement.              Action/Plan: Pt from home and daughter plans to stay with her for a week or so. Pt states that she has been set up for Outpatient PT and she has a rw, cane, and tub bench at home. Pt states she does not need any additional equipment. No other CM needs communicated.  Expected Discharge Date:  04/22/16               Expected Discharge Plan:  Home/Self Care  In-House Referral:     Discharge planning Services  CM Consult  Post Acute Care Choice:    Choice offered to:     DME Arranged:    DME Agency:     HH Arranged:    HH Agency:     Status of Service:  Completed, signed off  If discussed at [employer] of Stay Meetings, dates discussed:    Additional CommentsLynnell Catalan, RN 04/21/2016, 11:06 AM [phone]

## 2016-04-21 NOTE — Progress Notes (Signed)
Patient ID: Tasha Hunt, female   DOB: [birth year], 71 y.o.   MRN: [id number] Subjective: 1 Day Post-Op Procedure(s) (LRB): RIGHT TOTAL KNEE ARTHROPLASTY (Right)    Patient reports pain as moderate.  History of post op nausea, but reports doing ok thus far Plan is for home discharge.  Eager to work with PT due to safety concerns  Objective:   VITALS:   Vitals:   04/21/16 1349 04/21/16 2200  BP: (!) 118/55 118/61  Pulse: 67 64  Resp: 16 16  Temp: 98 F (36.7 C) 97.8 F (36.6 C)    Neurovascular intact Incision: dressing C/D/I - right knee  LABS  Recent Labs  04/21/16 0415  HGB 12.1  HCT 36.6  WBC 18.6*  PLT 250     Recent Labs  04/21/16 0415  NA 138  K 4.6  BUN 9  CREATININE 0.60  GLUCOSE 145*    No results for input(s): LABPT, INR in the last 72 hours.   Assessment/Plan: 1 Day Post-Op Procedure(s) (LRB): RIGHT TOTAL KNEE ARTHROPLASTY (Right)   Past Medical History:  Diagnosis Date  . Allergic rhinitis, cause unspecified   . Aneurysm of splenic artery ([place])    noted since 2002  . Anxiety   . Arthritis    hands  . Complication of anesthesia   . Depressive disorder, not elsewhere classified   . Esophageal reflux   . H/O hiatal hernia   . Hypertension   . Obesity, unspecified   . Pneumonia    03/27/11 "many many years ago"  . PONV (postoperative nausea and vomiting)      Advance diet Up with therapy Plan for discharge tomorrow  Remain in hospital for in house PT to maximize safety for home discharge Continue to monitor for post op nausea, ensure good PO intake

## 2016-04-22 LAB — BASIC METABOLIC PANEL
Anion gap: 7 (ref 5–15)
BUN: 15 mg/dL (ref 6–20)
CALCIUM: 9 mg/dL (ref 8.9–10.3)
CHLORIDE: 99 mmol/L — AB (ref 101–111)
CO2: 32 mmol/L (ref 22–32)
CREATININE: 0.7 mg/dL (ref 0.44–1.00)
GFR calc Af Amer: >60 mL/min (ref >60)
GFR calc non Af Amer: >60 mL/min (ref >60)
Glucose, Bld: 145 mg/dL — ABNORMAL HIGH (ref 65–99)
Potassium: 4.7 mmol/L (ref 3.5–5.1)
SODIUM: 138 mmol/L (ref 135–145)

## 2016-04-22 LAB — CBC
HCT: 37.1 % (ref 36.0–46.0)
Hemoglobin: 12.1 g/dL (ref 12.0–15.0)
MCH: 29.5 pg (ref 26.0–34.0)
MCHC: 32.6 g/dL (ref 30.0–36.0)
MCV: 90.5 fL (ref 78.0–100.0)
Platelets: 269 10*3/uL (ref 150–400)
RBC: 4.1 MIL/uL (ref 3.87–5.11)
RDW: 16.2 % — AB (ref 11.5–15.5)
WBC: 17.6 10*3/uL — ABNORMAL HIGH (ref 4.0–10.5)

## 2016-04-22 NOTE — Progress Notes (Signed)
qPhysical Therapy Treatment Patient Details Name: Tasha Hunt MRN: [id number] DOB: [birth year] Today's Date: 04/22/2016    History of Present Illness R TKA    PT Comments    Pt ambulated good distance in hallway and performed LE exercises with HEP handout provided yesterday.  Pt states her daughter will be assisting her for a week upon d/c.  Pt feels ready for d/c home today and had no further questions.   Follow Up Recommendations  Outpatient PT;Supervision/Assistance - 24 hour     Equipment Recommendations  None recommended by PT    Recommendations for Other Services       Precautions / Restrictions Precautions Precautions: Fall;Knee Restrictions Other Position/Activity Restrictions: WBAT    Mobility  Bed Mobility Overal bed mobility: Needs Assistance Bed Mobility: Supine to Sit     Supine to sit: Supervision        Transfers Overall transfer level: Needs assistance Equipment used: Rolling walker (2 wheeled) Transfers: Sit to/from Stand Sit to Stand: Supervision         General transfer comment: able to recall safe technique  Ambulation/Gait Ambulation/Gait assistance: Min guard;Supervision Ambulation Distance (Feet): 220 Feet Assistive device: Rolling walker (2 wheeled) Gait Pattern/deviations: Step-to pattern;Decreased stance time - right;Antalgic     General Gait Details: improved distance today   Stairs            Wheelchair Mobility    Modified Rankin (Stroke Patients Only)       Balance                                            Cognition Arousal/Alertness: Awake/alert Behavior During Therapy: WFL for tasks assessed/performed Overall Cognitive Status: Within Functional Limits for tasks assessed                                        Exercises Total Joint Exercises Ankle Circles/Pumps: AROM;Both;10 reps Quad Sets: AROM;Both;10 reps Towel Squeeze: AROM;Both;10 reps Short Arc Quad:  AROM;Right;10 reps Heel Slides: Right;10 reps;AAROM Hip ABduction/ADduction: AROM;Right;10 reps Straight Leg Raises: AROM;Right;10 reps    General Comments        Pertinent Vitals/Pain Pain Assessment: 0-10 Pain Score: 3  Pain Location: R knee posterior Pain Descriptors / Indicators: Sore;Tightness Pain Intervention(s): Limited activity within patient's tolerance;Monitored during session;Repositioned    Home Living                      Prior Function            PT Goals (current goals can now be found in the care plan section) Progress towards PT goals: Progressing toward goals    Frequency    7X/week      PT Plan Current plan remains appropriate    Co-evaluation             End of Session   Activity Tolerance: Patient tolerated treatment well Patient left: in chair;with call bell/phone within reach   PT Visit Diagnosis: Difficulty in walking, not elsewhere classified (R26.2);Pain Pain - Right/Left: Right Pain - part of body: Knee     Time: 4627-0350 PT Time Calculation (min) (ACUTE ONLY): 28 min  Charges:  $Gait Training: 8-22 mins $Therapeutic Exercise: 8-22 mins  G CodesCarmelia Bake, PT, DPT 04/22/2016 Pager: [phone]    York Ram E 04/22/2016, 1:27 PM

## 2016-04-22 NOTE — Progress Notes (Signed)
Pt to d/c home. Outpatient Pt scheduled and no DME needs. AVS reviewed and "My Chart" discussed with pt. Pt capable of verbalizing medications, signs and symptoms of infection, and follow-up appointments. Remains hemodynamically stable. No signs and symptoms of distress. Educated pt to return to ER in the case of SOB, dizziness, or chest pain.

## 2016-04-22 NOTE — Progress Notes (Signed)
     Subjective: 2 Days Post-Op Procedure(s) (LRB): RIGHT TOTAL KNEE ARTHROPLASTY (Right)   Patient reports pain as mild, pain well controlled.  No events throughout the night. Feels that she has progressed well from yesterday and feels much safe after working with PT.  Ready to be discharged home.  Objective:   VITALS:   Vitals:   04/21/16 2200 04/22/16 0500  BP: 118/61 115/64  Pulse: 64 67  Resp: 16 16  Temp: 97.8 F (36.6 C) 98.6 F (37 C)    Dorsiflexion/Plantar flexion intact Incision: dressing C/D/I No cellulitis present Compartment soft  LABS  Recent Labs  04/21/16 0415 04/22/16 0418  HGB 12.1 12.1  HCT 36.6 37.1  WBC 18.6* 17.6*  PLT 250 269     Recent Labs  04/21/16 0415 04/22/16 0418  NA 138 138  K 4.6 4.7  BUN 9 15  CREATININE 0.60 0.70  GLUCOSE 145* 145*     Assessment/Plan: 2 Days Post-Op Procedure(s) (LRB): RIGHT TOTAL KNEE ARTHROPLASTY (Right) Up with therapy Discharge home Follow up in 2 weeks at [facility]. Follow up with OLIN,Johnie Makki D in 2 weeks.  Contact information:  [facility] [address], [address] [place]   PAC  04/22/2016, 8:52 AM

## 2016-04-24 DIAGNOSIS — M1711 Unilateral primary osteoarthritis, right knee: Secondary | ICD-10-CM | POA: Diagnosis not present

## 2016-04-27 NOTE — Discharge Summary (Signed)
Physician Discharge Summary  Patient ID: JANNINE ABREU MRN: [id number] DOB/AGE: [birth year] 71 y.o.  Admit date: 04/20/2016 Discharge date: 04/22/2016   Procedures:  Procedure(s) (LRB): RIGHT TOTAL KNEE ARTHROPLASTY (Right)  Attending Physician:  Dr. Paralee Cancel   Admission Diagnoses:   Right knee primary OA / pain  Discharge Diagnoses:  Principal Problem:   S/P right TKA  Past Medical History:  Diagnosis Date  . Allergic rhinitis, cause unspecified   . Aneurysm of splenic artery ([place])    noted since 2002  . Anxiety   . Arthritis    hands  . Complication of anesthesia   . Depressive disorder, not elsewhere classified   . Esophageal reflux   . H/O hiatal hernia   . Hypertension   . Obesity, unspecified   . Pneumonia    03/27/11 "many many years ago"  . PONV (postoperative nausea and vomiting)     HPI:    SHERROL VICARS, 71 y.o. female, has a history of pain and functional disability in the right knee due to arthritis and has failed non-surgical conservative treatments for greater than 12 weeks to include NSAID's and/or analgesics, corticosteriod injections, viscosupplementation injections and activity modification.  Onset of symptoms was gradual, starting ~4 years ago with gradually worsening course since that time. The patient noted no past surgery on the right knee(s).  Patient currently rates pain in the left knee(s) at 8 out of 10 with activity. Patient has night pain, worsening of pain with activity and weight bearing, pain that interferes with activities of daily living, pain with passive range of motion, crepitus and joint swelling.  Patient has evidence of periarticular osteophytes and joint space narrowing by imaging studies.  There is no active infection.   Risks, benefits and expectations were discussed with the patient.  Risks including but not limited to the risk of anesthesia, blood clots, nerve damage, blood vessel damage, failure of the prosthesis, infection  and up to and including death.  Patient understand the risks, benefits and expectations and wishes to proceed with surgery.  PCP: Velna Hatchet, MD   Discharged Condition: good  Hospital Course:  Patient underwent the above stated procedure on 04/20/2016. Patient tolerated the procedure well and brought to the recovery room in good condition and subsequently to the floor.  POD #1 BP: 118/61 ; Pulse: 64 ; Temp: 97.8 F (36.6 C) ; Resp: 16 Patient reports pain as moderate.  History of post op nausea, but reports doing ok thus far. Plan is for home discharge when ready.  Eager to work with PT due to safety concerns. Neurovascular intact and incision: dressing C/D/I - right knee  LABS  Basename    HGB     12.1  HCT     36.6   POD #2  BP: 115/64 ; Pulse: 67 ; Temp: 98.6 F (37 C) ; Resp: 16 Patient reports pain as mild, pain well controlled.  No events throughout the night. Feels that she has progressed well from yesterday and feels much safe after working with PT.  Ready to be discharged home. Dorsiflexion/plantar flexion intact, incision: dressing C/D/I, no cellulitis present and compartment soft.   LABS  Basename    HGB     12.1  HCT     37.1    Discharge Exam: General appearance: alert, cooperative and no distress Extremities: Homans sign is negative, no sign of DVT, no edema, redness or tenderness in the calves or thighs and no ulcers, gangrene or trophic  changes  Disposition: Home with follow up in 2 weeks   Follow-up Information    Mauri Pole, MD. Schedule an appointment as soon as possible for a visit in 2 week(s).   Specialty:  Orthopedic Surgery Contact information: [address] [place] [address] [phone]           Discharge Instructions    Call MD / Call 911    Complete by:  As directed    If you experience chest pain or shortness of breath, CALL 911 and be transported to the hospital emergency room.  If you develope a fever  above 101 F, pus (white drainage) or increased drainage or redness at the wound, or calf pain, call your surgeon's office.   Change dressing    Complete by:  As directed    Maintain surgical dressing until follow up in the clinic. If the edges start to pull up, may reinforce with tape. If the dressing is no longer working, may remove and cover with gauze and tape, but must keep the area dry and clean.  Call with any questions or concerns.   Constipation Prevention    Complete by:  As directed    Drink plenty of fluids.  Prune juice may be helpful.  You may use a stool softener, such as Colace (over the counter) 100 mg twice a day.  Use MiraLax (over the counter) for constipation as needed.   Diet - low sodium heart healthy    Complete by:  As directed    Discharge instructions    Complete by:  As directed    Maintain surgical dressing until follow up in the clinic. If the edges start to pull up, may reinforce with tape. If the dressing is no longer working, may remove and cover with gauze and tape, but must keep the area dry and clean.  Follow up in 2 weeks at [facility]. Call with any questions or concerns.   Increase activity slowly as tolerated    Complete by:  As directed    Weight bearing as tolerated with assist device (walker, cane, etc) as directed, use it as long as suggested by your surgeon or therapist, typically at least 4-6 weeks.   TED hose    Complete by:  As directed    Use stockings (TED hose) for 2 weeks on both leg(s).  You may remove them at night for sleeping.      Allergies as of 04/22/2016      Reactions   Codeine Nausea Only   Morphine And Related Nausea And Vomiting      Medication List    TAKE these medications   ALPRAZolam 0.25 MG tablet Commonly known as:  XANAX Take 0.5 mg by mouth 2 (two) times daily as needed (FOR ANXIETY). For anxiety   aspirin 81 MG chewable tablet Chew 1 tablet (81 mg total) by mouth 2 (two) times daily. Take for 4  weeks.   BLUE-EMU SUPER STRENGTH EX Apply 1 application topically 2 (two) times daily as needed (for pain).   SALONPAS PAIN RELIEF PATCH EX Place 1 patch onto the skin daily as needed (for pain.).   CALCIUM PO Take 1,200 mg by mouth daily.   celecoxib 100 MG capsule Commonly known as:  CELEBREX Take 100 mg by mouth daily as needed (for pain/inflammation.).   cetirizine 10 MG tablet Commonly known as:  ZYRTEC Take 10 mg by mouth daily.   DEXILANT 60 MG capsule Generic drug:  dexlansoprazole Take 60 mg by mouth daily.   DILT-XR 180 MG 24 hr capsule Generic drug:  diltiazem Take 180 mg by mouth daily.   docusate sodium 100 MG capsule Commonly known as:  COLACE Take 1 capsule (100 mg total) by mouth 2 (two) times daily.   ferrous sulfate 325 (65 FE) MG tablet Commonly known as:  FERROUSUL Take 1 tablet (325 mg total) by mouth 3 (three) times daily with meals.   Fish Oil 1200 MG Caps Take 1,200 mg by mouth daily.   FLONASE 50 MCG/ACT nasal spray Generic drug:  fluticasone Place 2 sprays into the nose daily.   FLUoxetine 20 MG tablet Commonly known as:  PROZAC Take 20 mg by mouth daily.   Fluticasone Furoate 100 MCG/ACT Aepb Commonly known as:  ARNUITY ELLIPTA Inhale 1 Dose into the lungs daily. What changed:  how much to take  additional instructions   hydrochlorothiazide 12.5 MG tablet Commonly known as:  HYDRODIURIL Take 12.5 mg by mouth daily.   HYDROcodone-acetaminophen 7.5-325 MG tablet Commonly known as:  NORCO Take 1-2 tablets by mouth every 4 (four) hours as needed for moderate pain or severe pain.   ketoconazole 2 % cream Commonly known as:  NIZORAL Apply 1 application topically 2 (two) times daily as needed. For skin rash/fungus.   MAGNESIUM PO Take 500 mg by mouth daily.   methocarbamol 500 MG tablet Commonly known as:  ROBAXIN Take 1 tablet (500 mg total) by mouth every 6 (six) hours as needed for muscle spasms.   nystatin-triamcinolone  cream Commonly known as:  MYCOLOG II Apply 1 application topically 2 (two) times daily as needed. For skin irritation/fungus.   ondansetron 4 MG tablet Commonly known as:  ZOFRAN Take 1 tablet (4 mg total) by mouth every 8 (eight) hours as needed for nausea or vomiting.   OSTEO BI-FLEX ADV TRIPLE ST PO Take 2 tablets by mouth daily.   polyethylene glycol packet Commonly known as:  MIRALAX / GLYCOLAX Take 17 g by mouth 2 (two) times daily.   PROAIR HFA 108 (90 Base) MCG/ACT inhaler Generic drug:  albuterol Inhale 2 puffs into the lungs every 4 (four) hours as needed (FOR ASTHMA).   PROBIOTIC PO Take 1 capsule by mouth daily.   SYSTANE 0.4-0.3 % Soln Generic drug:  Polyethyl Glycol-Propyl Glycol Place 1 drop into both eyes 4 (four) times daily as needed (for dry eyes).   trolamine salicylate 10 % cream Commonly known as:  ASPERCREME Apply 1 application topically 2 (two) times daily as needed.   vitamin B-12 1000 MCG tablet Commonly known as:  CYANOCOBALAMIN Take 1,000 mcg by mouth daily.   Vitamin D3 2000 units Tabs Take 2,000 Units by mouth daily.   ZEGERID 20-1100 MG Caps capsule Generic drug:  Omeprazole-Sodium Bicarbonate Take 1 capsule by mouth at bedtime.        Signed: West Pugh. Corleen Otwell   PA-C  04/27/2016, 9:18 AM

## 2016-04-28 DIAGNOSIS — M1711 Unilateral primary osteoarthritis, right knee: Secondary | ICD-10-CM | POA: Diagnosis not present

## 2016-04-30 ENCOUNTER — Encounter: Payer: Medicare Other | Admitting: Vascular Surgery

## 2016-04-30 DIAGNOSIS — M1711 Unilateral primary osteoarthritis, right knee: Secondary | ICD-10-CM | POA: Diagnosis not present

## 2016-05-04 DIAGNOSIS — M1711 Unilateral primary osteoarthritis, right knee: Secondary | ICD-10-CM | POA: Diagnosis not present

## 2016-05-05 DIAGNOSIS — Z471 Aftercare following joint replacement surgery: Secondary | ICD-10-CM | POA: Diagnosis not present

## 2016-05-05 DIAGNOSIS — Z96651 Presence of right artificial knee joint: Secondary | ICD-10-CM | POA: Diagnosis not present

## 2016-05-08 DIAGNOSIS — M1711 Unilateral primary osteoarthritis, right knee: Secondary | ICD-10-CM | POA: Diagnosis not present

## 2016-05-12 DIAGNOSIS — M1711 Unilateral primary osteoarthritis, right knee: Secondary | ICD-10-CM | POA: Diagnosis not present

## 2016-05-14 DIAGNOSIS — M1711 Unilateral primary osteoarthritis, right knee: Secondary | ICD-10-CM | POA: Diagnosis not present

## 2016-05-15 ENCOUNTER — Ambulatory Visit (INDEPENDENT_AMBULATORY_CARE_PROVIDER_SITE_OTHER): Payer: Medicare Other | Admitting: Ophthalmology

## 2016-05-19 DIAGNOSIS — M1711 Unilateral primary osteoarthritis, right knee: Secondary | ICD-10-CM | POA: Diagnosis not present

## 2016-05-20 DIAGNOSIS — Z471 Aftercare following joint replacement surgery: Secondary | ICD-10-CM | POA: Diagnosis not present

## 2016-05-20 DIAGNOSIS — Z96651 Presence of right artificial knee joint: Secondary | ICD-10-CM | POA: Diagnosis not present

## 2016-05-21 ENCOUNTER — Ambulatory Visit: Payer: Medicare Other | Admitting: Vascular Surgery

## 2016-05-21 DIAGNOSIS — M1711 Unilateral primary osteoarthritis, right knee: Secondary | ICD-10-CM | POA: Diagnosis not present

## 2016-05-26 DIAGNOSIS — M1711 Unilateral primary osteoarthritis, right knee: Secondary | ICD-10-CM | POA: Diagnosis not present

## 2016-05-28 DIAGNOSIS — M1711 Unilateral primary osteoarthritis, right knee: Secondary | ICD-10-CM | POA: Diagnosis not present

## 2016-06-02 DIAGNOSIS — M1711 Unilateral primary osteoarthritis, right knee: Secondary | ICD-10-CM | POA: Diagnosis not present

## 2016-06-04 DIAGNOSIS — M1711 Unilateral primary osteoarthritis, right knee: Secondary | ICD-10-CM | POA: Diagnosis not present

## 2016-06-08 ENCOUNTER — Other Ambulatory Visit: Payer: Medicare Other

## 2016-06-17 DIAGNOSIS — Z96651 Presence of right artificial knee joint: Secondary | ICD-10-CM | POA: Diagnosis not present

## 2016-06-17 DIAGNOSIS — Z471 Aftercare following joint replacement surgery: Secondary | ICD-10-CM | POA: Diagnosis not present

## 2016-06-25 ENCOUNTER — Ambulatory Visit: Payer: Medicare Other | Admitting: Vascular Surgery

## 2016-07-08 ENCOUNTER — Other Ambulatory Visit: Payer: Medicare Other

## 2016-07-08 DIAGNOSIS — L814 Other melanin hyperpigmentation: Secondary | ICD-10-CM | POA: Diagnosis not present

## 2016-07-08 DIAGNOSIS — D1801 Hemangioma of skin and subcutaneous tissue: Secondary | ICD-10-CM | POA: Diagnosis not present

## 2016-07-08 DIAGNOSIS — L821 Other seborrheic keratosis: Secondary | ICD-10-CM | POA: Diagnosis not present

## 2016-07-08 DIAGNOSIS — L570 Actinic keratosis: Secondary | ICD-10-CM | POA: Diagnosis not present

## 2016-07-08 DIAGNOSIS — D2272 Melanocytic nevi of left lower limb, including hip: Secondary | ICD-10-CM | POA: Diagnosis not present

## 2016-07-08 DIAGNOSIS — D225 Melanocytic nevi of trunk: Secondary | ICD-10-CM | POA: Diagnosis not present

## 2016-07-08 DIAGNOSIS — L723 Sebaceous cyst: Secondary | ICD-10-CM | POA: Diagnosis not present

## 2016-07-08 DIAGNOSIS — D2261 Melanocytic nevi of right upper limb, including shoulder: Secondary | ICD-10-CM | POA: Diagnosis not present

## 2016-07-08 DIAGNOSIS — D2262 Melanocytic nevi of left upper limb, including shoulder: Secondary | ICD-10-CM | POA: Diagnosis not present

## 2016-07-08 DIAGNOSIS — L812 Freckles: Secondary | ICD-10-CM | POA: Diagnosis not present

## 2016-07-09 ENCOUNTER — Encounter: Payer: Self-pay | Admitting: Vascular Surgery

## 2016-07-23 ENCOUNTER — Encounter: Payer: Self-pay | Admitting: Vascular Surgery

## 2016-07-23 ENCOUNTER — Ambulatory Visit (INDEPENDENT_AMBULATORY_CARE_PROVIDER_SITE_OTHER): Payer: Medicare Other | Admitting: Vascular Surgery

## 2016-07-23 VITALS — BP 136/86 | HR 83 | Temp 98.2°F | Resp 16 | Ht 62.0 in | Wt 182.0 lb

## 2016-07-23 DIAGNOSIS — I728 Aneurysm of other specified arteries: Secondary | ICD-10-CM

## 2016-07-23 NOTE — Progress Notes (Signed)
Patient is a 71 year old female who underwent coil embolization of a splenic artery aneurysm in 2013. She returns today for follow-up. She recently underwent a right total knee replacement and is still recovering from this. Otherwise she reports no new medical problems. She denies any abdominal back or shoulder pain. She does have a family history of abdominal aortic aneurysm in her father died from rupture of this. She still has some anxiety related to development of an aortic aneurysm.  Review of systems: She denies any shortness of breath. She denies any chest pain.  Vitals:   07/23/16 1016  BP: 136/86  Pulse: 83  Resp: 16  Temp: 98.2 F (36.8 C)  TempSrc: Oral  SpO2: 93%  Weight: 182 lb (82.6 kg)  Height: 5\' 2"  (1.575 m)    Extremities: 2+ dorsalis pedis pulses bilaterally.  Abdomen: Soft nontender nondistended no pulsatile mass  Assessment: Clinically well status post coil embolization of splenic artery aneurysm. The patient has not had any imaging of her abdominal arterial tree since 2013. She has significant anxiety related to development of aortic aneurysm or worsening of her splenic artery aneurysm due to the fact that her father died of an aneurysm.   Plan: We will obtain an ultrasound of her aorta and assess as much of her stomach artery as possible meanwhile also ruling out aortic aneurysm since this would be difficult due to the size of her abdomen otherwise. Since she has a strong family history I believe that this is certainly worthwhile and that if she rules out aortic aneurysm at age 76 tissue probably does not need further workup.  I will call her back regarding the results of her abdominal aortic ultrasound.  We will consider a follow-up CT scan only if they are unable to image her adequately on her aortic ultrasound.  Ruta Hinds, MD [facility] [facility] Office: [phone] Pager: [phone]

## 2016-07-28 NOTE — Addendum Note (Signed)
Addended by: Lianne Cure A on: 07/28/2016 11:16 AM   Modules accepted: Orders

## 2016-08-05 ENCOUNTER — Ambulatory Visit (HOSPITAL_COMMUNITY)
Admission: RE | Admit: 2016-08-05 | Discharge: 2016-08-05 | Disposition: A | Discharge status: Home / Self Care | Payer: Medicare Other | Source: Ambulatory Visit | Attending: Vascular Surgery | Admitting: Vascular Surgery

## 2016-08-05 DIAGNOSIS — I728 Aneurysm of other specified arteries: Secondary | ICD-10-CM | POA: Insufficient documentation

## 2016-08-07 DIAGNOSIS — Z471 Aftercare following joint replacement surgery: Secondary | ICD-10-CM | POA: Diagnosis not present

## 2016-08-07 DIAGNOSIS — Z96651 Presence of right artificial knee joint: Secondary | ICD-10-CM | POA: Diagnosis not present

## 2016-08-13 ENCOUNTER — Telehealth: Payer: Self-pay | Admitting: Vascular Surgery

## 2016-08-13 NOTE — Telephone Encounter (Signed)
Reviewed pt Aortic US from 7/18.  Most of aorta visualized but not distal segment or iliacs  Pt would prefer another follow up appt in a year to look at spenic and distal aorta/iliac  Will schdule CTA and office visit with our NP 1 year.  Ruta Hinds

## 2016-08-13 NOTE — Telephone Encounter (Signed)
Put in pt recall.

## 2016-08-17 DIAGNOSIS — N958 Other specified menopausal and perimenopausal disorders: Secondary | ICD-10-CM | POA: Diagnosis not present

## 2016-08-24 ENCOUNTER — Other Ambulatory Visit: Payer: Self-pay | Admitting: Internal Medicine

## 2016-08-24 DIAGNOSIS — N63 Unspecified lump in unspecified breast: Secondary | ICD-10-CM

## 2016-08-25 ENCOUNTER — Ambulatory Visit
Admission: RE | Admit: 2016-08-25 | Discharge: 2016-08-25 | Disposition: A | Discharge status: Home / Self Care | Payer: Medicare Other | Source: Ambulatory Visit | Attending: Internal Medicine | Admitting: Internal Medicine

## 2016-08-25 ENCOUNTER — Other Ambulatory Visit: Payer: Self-pay | Admitting: Internal Medicine

## 2016-08-25 DIAGNOSIS — R928 Other abnormal and inconclusive findings on diagnostic imaging of breast: Secondary | ICD-10-CM | POA: Diagnosis not present

## 2016-08-25 DIAGNOSIS — N63 Unspecified lump in unspecified breast: Secondary | ICD-10-CM

## 2016-08-25 DIAGNOSIS — N6489 Other specified disorders of breast: Secondary | ICD-10-CM | POA: Diagnosis not present

## 2016-08-25 DIAGNOSIS — N6310 Unspecified lump in the right breast, unspecified quadrant: Secondary | ICD-10-CM

## 2016-09-07 ENCOUNTER — Ambulatory Visit (INDEPENDENT_AMBULATORY_CARE_PROVIDER_SITE_OTHER): Payer: Medicare Other | Admitting: Ophthalmology

## 2016-09-07 DIAGNOSIS — D3131 Benign neoplasm of right choroid: Secondary | ICD-10-CM

## 2016-09-07 DIAGNOSIS — I10 Essential (primary) hypertension: Secondary | ICD-10-CM

## 2016-09-07 DIAGNOSIS — H43813 Vitreous degeneration, bilateral: Secondary | ICD-10-CM

## 2016-09-07 DIAGNOSIS — H35033 Hypertensive retinopathy, bilateral: Secondary | ICD-10-CM

## 2016-09-18 DIAGNOSIS — H35033 Hypertensive retinopathy, bilateral: Secondary | ICD-10-CM | POA: Diagnosis not present

## 2016-09-18 DIAGNOSIS — H524 Presbyopia: Secondary | ICD-10-CM | POA: Diagnosis not present

## 2016-09-18 DIAGNOSIS — D3131 Benign neoplasm of right choroid: Secondary | ICD-10-CM | POA: Diagnosis not present

## 2016-09-18 DIAGNOSIS — H04123 Dry eye syndrome of bilateral lacrimal glands: Secondary | ICD-10-CM | POA: Diagnosis not present

## 2016-09-18 DIAGNOSIS — H5213 Myopia, bilateral: Secondary | ICD-10-CM | POA: Diagnosis not present

## 2016-10-02 DIAGNOSIS — G8929 Other chronic pain: Secondary | ICD-10-CM | POA: Diagnosis not present

## 2016-10-02 DIAGNOSIS — Z471 Aftercare following joint replacement surgery: Secondary | ICD-10-CM | POA: Diagnosis not present

## 2016-10-02 DIAGNOSIS — Z96651 Presence of right artificial knee joint: Secondary | ICD-10-CM | POA: Diagnosis not present

## 2016-10-02 DIAGNOSIS — M25561 Pain in right knee: Secondary | ICD-10-CM | POA: Diagnosis not present

## 2016-11-02 DIAGNOSIS — Z23 Encounter for immunization: Secondary | ICD-10-CM | POA: Diagnosis not present

## 2016-11-06 ENCOUNTER — Other Ambulatory Visit: Payer: Self-pay | Admitting: Internal Medicine

## 2016-11-06 DIAGNOSIS — Z1231 Encounter for screening mammogram for malignant neoplasm of breast: Secondary | ICD-10-CM

## 2016-11-13 DIAGNOSIS — Z96651 Presence of right artificial knee joint: Secondary | ICD-10-CM | POA: Diagnosis not present

## 2016-11-13 DIAGNOSIS — Z471 Aftercare following joint replacement surgery: Secondary | ICD-10-CM | POA: Diagnosis not present

## 2016-11-26 ENCOUNTER — Ambulatory Visit
Admission: RE | Admit: 2016-11-26 | Discharge: 2016-11-26 | Disposition: A | Discharge status: Home / Self Care | Payer: Medicare Other | Source: Ambulatory Visit | Attending: Internal Medicine | Admitting: Internal Medicine

## 2016-11-26 DIAGNOSIS — N6310 Unspecified lump in the right breast, unspecified quadrant: Secondary | ICD-10-CM

## 2016-11-26 DIAGNOSIS — N6311 Unspecified lump in the right breast, upper outer quadrant: Secondary | ICD-10-CM | POA: Diagnosis not present

## 2016-12-07 DIAGNOSIS — R7309 Other abnormal glucose: Secondary | ICD-10-CM | POA: Diagnosis not present

## 2016-12-07 DIAGNOSIS — R82998 Other abnormal findings in urine: Secondary | ICD-10-CM | POA: Diagnosis not present

## 2016-12-07 DIAGNOSIS — E7849 Other hyperlipidemia: Secondary | ICD-10-CM | POA: Diagnosis not present

## 2016-12-07 DIAGNOSIS — I10 Essential (primary) hypertension: Secondary | ICD-10-CM | POA: Diagnosis not present

## 2016-12-14 DIAGNOSIS — R7309 Other abnormal glucose: Secondary | ICD-10-CM | POA: Diagnosis not present

## 2016-12-14 DIAGNOSIS — M19041 Primary osteoarthritis, right hand: Secondary | ICD-10-CM | POA: Diagnosis not present

## 2016-12-14 DIAGNOSIS — Z1389 Encounter for screening for other disorder: Secondary | ICD-10-CM | POA: Diagnosis not present

## 2016-12-14 DIAGNOSIS — Z0000 Encounter for general adult medical examination without abnormal findings: Secondary | ICD-10-CM | POA: Diagnosis not present

## 2016-12-14 DIAGNOSIS — M1712 Unilateral primary osteoarthritis, left knee: Secondary | ICD-10-CM | POA: Diagnosis not present

## 2016-12-14 DIAGNOSIS — I10 Essential (primary) hypertension: Secondary | ICD-10-CM | POA: Diagnosis not present

## 2016-12-14 DIAGNOSIS — Z800 Family history of malignant neoplasm of digestive organs: Secondary | ICD-10-CM | POA: Diagnosis not present

## 2016-12-14 DIAGNOSIS — J45998 Other asthma: Secondary | ICD-10-CM | POA: Diagnosis not present

## 2016-12-14 DIAGNOSIS — E7849 Other hyperlipidemia: Secondary | ICD-10-CM | POA: Diagnosis not present

## 2016-12-14 DIAGNOSIS — Z6835 Body mass index (BMI) 35.0-35.9, adult: Secondary | ICD-10-CM | POA: Diagnosis not present

## 2016-12-14 DIAGNOSIS — Z86010 Personal history of colonic polyps: Secondary | ICD-10-CM | POA: Diagnosis not present

## 2016-12-14 DIAGNOSIS — M1711 Unilateral primary osteoarthritis, right knee: Secondary | ICD-10-CM | POA: Diagnosis not present

## 2016-12-15 DIAGNOSIS — K219 Gastro-esophageal reflux disease without esophagitis: Secondary | ICD-10-CM | POA: Diagnosis not present

## 2016-12-23 ENCOUNTER — Ambulatory Visit
Admission: RE | Admit: 2016-12-23 | Discharge: 2016-12-23 | Disposition: A | Discharge status: Home / Self Care | Payer: Medicare Other | Source: Ambulatory Visit | Attending: Internal Medicine | Admitting: Internal Medicine

## 2016-12-23 DIAGNOSIS — Z1231 Encounter for screening mammogram for malignant neoplasm of breast: Secondary | ICD-10-CM

## 2016-12-24 DIAGNOSIS — Z1212 Encounter for screening for malignant neoplasm of rectum: Secondary | ICD-10-CM | POA: Diagnosis not present

## 2016-12-30 DIAGNOSIS — R05 Cough: Secondary | ICD-10-CM | POA: Diagnosis not present

## 2016-12-30 DIAGNOSIS — I10 Essential (primary) hypertension: Secondary | ICD-10-CM | POA: Diagnosis not present

## 2016-12-30 DIAGNOSIS — J189 Pneumonia, unspecified organism: Secondary | ICD-10-CM | POA: Diagnosis not present

## 2016-12-30 DIAGNOSIS — Z6835 Body mass index (BMI) 35.0-35.9, adult: Secondary | ICD-10-CM | POA: Diagnosis not present

## 2016-12-30 DIAGNOSIS — J45909 Unspecified asthma, uncomplicated: Secondary | ICD-10-CM | POA: Diagnosis not present

## 2016-12-30 DIAGNOSIS — J449 Chronic obstructive pulmonary disease, unspecified: Secondary | ICD-10-CM | POA: Diagnosis not present

## 2016-12-30 DIAGNOSIS — J209 Acute bronchitis, unspecified: Secondary | ICD-10-CM | POA: Diagnosis not present

## 2017-01-10 DIAGNOSIS — J45901 Unspecified asthma with (acute) exacerbation: Secondary | ICD-10-CM | POA: Diagnosis not present

## 2017-01-10 DIAGNOSIS — B370 Candidal stomatitis: Secondary | ICD-10-CM | POA: Diagnosis not present

## 2017-01-10 DIAGNOSIS — R05 Cough: Secondary | ICD-10-CM | POA: Diagnosis not present

## 2017-01-29 ENCOUNTER — Ambulatory Visit: Payer: Medicare Other | Admitting: Allergy

## 2017-02-03 DIAGNOSIS — E7849 Other hyperlipidemia: Secondary | ICD-10-CM | POA: Diagnosis not present

## 2017-03-09 DIAGNOSIS — Z6835 Body mass index (BMI) 35.0-35.9, adult: Secondary | ICD-10-CM | POA: Diagnosis not present

## 2017-03-09 DIAGNOSIS — Z01419 Encounter for gynecological examination (general) (routine) without abnormal findings: Secondary | ICD-10-CM | POA: Diagnosis not present

## 2017-03-19 ENCOUNTER — Ambulatory Visit: Payer: Medicare Other | Admitting: Podiatry

## 2017-04-03 DIAGNOSIS — N3001 Acute cystitis with hematuria: Secondary | ICD-10-CM | POA: Diagnosis not present

## 2017-04-14 DIAGNOSIS — L820 Inflamed seborrheic keratosis: Secondary | ICD-10-CM | POA: Diagnosis not present

## 2017-04-14 DIAGNOSIS — D485 Neoplasm of uncertain behavior of skin: Secondary | ICD-10-CM | POA: Diagnosis not present

## 2017-04-14 DIAGNOSIS — L570 Actinic keratosis: Secondary | ICD-10-CM | POA: Diagnosis not present

## 2017-04-15 DIAGNOSIS — M1990 Unspecified osteoarthritis, unspecified site: Secondary | ICD-10-CM | POA: Diagnosis not present

## 2017-04-15 DIAGNOSIS — K219 Gastro-esophageal reflux disease without esophagitis: Secondary | ICD-10-CM | POA: Diagnosis not present

## 2017-04-15 DIAGNOSIS — E7849 Other hyperlipidemia: Secondary | ICD-10-CM | POA: Diagnosis not present

## 2017-04-15 DIAGNOSIS — M25511 Pain in right shoulder: Secondary | ICD-10-CM | POA: Diagnosis not present

## 2017-04-15 DIAGNOSIS — N390 Urinary tract infection, site not specified: Secondary | ICD-10-CM | POA: Diagnosis not present

## 2017-04-15 DIAGNOSIS — Z6835 Body mass index (BMI) 35.0-35.9, adult: Secondary | ICD-10-CM | POA: Diagnosis not present

## 2017-04-15 DIAGNOSIS — F339 Major depressive disorder, recurrent, unspecified: Secondary | ICD-10-CM | POA: Diagnosis not present

## 2017-04-21 DIAGNOSIS — M25511 Pain in right shoulder: Secondary | ICD-10-CM | POA: Diagnosis not present

## 2017-04-21 DIAGNOSIS — M7541 Impingement syndrome of right shoulder: Secondary | ICD-10-CM | POA: Diagnosis not present

## 2017-04-28 DIAGNOSIS — M1711 Unilateral primary osteoarthritis, right knee: Secondary | ICD-10-CM | POA: Diagnosis not present

## 2017-05-01 DIAGNOSIS — I10 Essential (primary) hypertension: Secondary | ICD-10-CM | POA: Diagnosis not present

## 2017-05-01 DIAGNOSIS — H00012 Hordeolum externum right lower eyelid: Secondary | ICD-10-CM | POA: Diagnosis not present

## 2017-05-24 DIAGNOSIS — E669 Obesity, unspecified: Secondary | ICD-10-CM | POA: Diagnosis not present

## 2017-05-24 DIAGNOSIS — M21612 Bunion of left foot: Secondary | ICD-10-CM | POA: Diagnosis not present

## 2017-05-24 DIAGNOSIS — M7551 Bursitis of right shoulder: Secondary | ICD-10-CM | POA: Diagnosis not present

## 2017-05-24 DIAGNOSIS — E7849 Other hyperlipidemia: Secondary | ICD-10-CM | POA: Diagnosis not present

## 2017-05-24 DIAGNOSIS — Z6835 Body mass index (BMI) 35.0-35.9, adult: Secondary | ICD-10-CM | POA: Diagnosis not present

## 2017-05-24 DIAGNOSIS — F339 Major depressive disorder, recurrent, unspecified: Secondary | ICD-10-CM | POA: Diagnosis not present

## 2017-07-15 ENCOUNTER — Other Ambulatory Visit: Payer: Self-pay

## 2017-07-15 DIAGNOSIS — I729 Aneurysm of unspecified site: Secondary | ICD-10-CM

## 2017-07-15 DIAGNOSIS — I728 Aneurysm of other specified arteries: Secondary | ICD-10-CM

## 2017-07-21 ENCOUNTER — Other Ambulatory Visit: Payer: Self-pay | Admitting: Internal Medicine

## 2017-07-21 ENCOUNTER — Ambulatory Visit
Admission: RE | Admit: 2017-07-21 | Discharge: 2017-07-21 | Disposition: A | Discharge status: Home / Self Care | Payer: PPO | Source: Ambulatory Visit | Attending: Internal Medicine | Admitting: Internal Medicine

## 2017-07-21 DIAGNOSIS — D72829 Elevated white blood cell count, unspecified: Secondary | ICD-10-CM

## 2017-07-21 DIAGNOSIS — R1031 Right lower quadrant pain: Secondary | ICD-10-CM

## 2017-07-21 DIAGNOSIS — R1013 Epigastric pain: Secondary | ICD-10-CM

## 2017-07-21 DIAGNOSIS — R110 Nausea: Secondary | ICD-10-CM | POA: Diagnosis not present

## 2017-07-21 DIAGNOSIS — Z6834 Body mass index (BMI) 34.0-34.9, adult: Secondary | ICD-10-CM | POA: Diagnosis not present

## 2017-07-21 DIAGNOSIS — J449 Chronic obstructive pulmonary disease, unspecified: Secondary | ICD-10-CM | POA: Diagnosis not present

## 2017-07-21 DIAGNOSIS — N390 Urinary tract infection, site not specified: Secondary | ICD-10-CM | POA: Diagnosis not present

## 2017-07-21 DIAGNOSIS — M546 Pain in thoracic spine: Secondary | ICD-10-CM | POA: Diagnosis not present

## 2017-07-21 MED ORDER — IOPAMIDOL (ISOVUE-300) INJECTION 61%
100.0000 mL | Freq: Once | INTRAVENOUS | Status: AC | PRN
  Administered 2017-07-21: 100 mL via INTRAVENOUS

## 2017-07-27 DIAGNOSIS — D72829 Elevated white blood cell count, unspecified: Secondary | ICD-10-CM | POA: Diagnosis not present

## 2017-09-02 DIAGNOSIS — D2272 Melanocytic nevi of left lower limb, including hip: Secondary | ICD-10-CM | POA: Diagnosis not present

## 2017-09-02 DIAGNOSIS — D2361 Other benign neoplasm of skin of right upper limb, including shoulder: Secondary | ICD-10-CM | POA: Diagnosis not present

## 2017-09-02 DIAGNOSIS — D2239 Melanocytic nevi of other parts of face: Secondary | ICD-10-CM | POA: Diagnosis not present

## 2017-09-02 DIAGNOSIS — L821 Other seborrheic keratosis: Secondary | ICD-10-CM | POA: Diagnosis not present

## 2017-09-02 DIAGNOSIS — D225 Melanocytic nevi of trunk: Secondary | ICD-10-CM | POA: Diagnosis not present

## 2017-09-02 DIAGNOSIS — D1801 Hemangioma of skin and subcutaneous tissue: Secondary | ICD-10-CM | POA: Diagnosis not present

## 2017-09-02 DIAGNOSIS — D2271 Melanocytic nevi of right lower limb, including hip: Secondary | ICD-10-CM | POA: Diagnosis not present

## 2017-09-02 DIAGNOSIS — L820 Inflamed seborrheic keratosis: Secondary | ICD-10-CM | POA: Diagnosis not present

## 2017-09-02 DIAGNOSIS — D2261 Melanocytic nevi of right upper limb, including shoulder: Secondary | ICD-10-CM | POA: Diagnosis not present

## 2017-09-02 DIAGNOSIS — D692 Other nonthrombocytopenic purpura: Secondary | ICD-10-CM | POA: Diagnosis not present

## 2017-09-06 ENCOUNTER — Ambulatory Visit
Admission: RE | Admit: 2017-09-06 | Discharge: 2017-09-06 | Disposition: A | Discharge status: Home / Self Care | Payer: Medicare Other | Source: Ambulatory Visit | Attending: Vascular Surgery | Admitting: Vascular Surgery

## 2017-09-06 DIAGNOSIS — I728 Aneurysm of other specified arteries: Secondary | ICD-10-CM

## 2017-09-06 DIAGNOSIS — I729 Aneurysm of unspecified site: Secondary | ICD-10-CM

## 2017-09-06 MED ORDER — IOPAMIDOL (ISOVUE-370) INJECTION 76%
75.0000 mL | Freq: Once | INTRAVENOUS | Status: AC | PRN
  Administered 2017-09-06: 75 mL via INTRAVENOUS

## 2017-09-09 ENCOUNTER — Encounter (INDEPENDENT_AMBULATORY_CARE_PROVIDER_SITE_OTHER): Payer: Medicare Other | Admitting: Ophthalmology

## 2017-09-09 DIAGNOSIS — H903 Sensorineural hearing loss, bilateral: Secondary | ICD-10-CM | POA: Diagnosis not present

## 2017-09-15 ENCOUNTER — Encounter (INDEPENDENT_AMBULATORY_CARE_PROVIDER_SITE_OTHER): Payer: PPO | Admitting: Ophthalmology

## 2017-09-15 DIAGNOSIS — H43813 Vitreous degeneration, bilateral: Secondary | ICD-10-CM

## 2017-09-15 DIAGNOSIS — I10 Essential (primary) hypertension: Secondary | ICD-10-CM | POA: Diagnosis not present

## 2017-09-15 DIAGNOSIS — D3131 Benign neoplasm of right choroid: Secondary | ICD-10-CM

## 2017-09-15 DIAGNOSIS — H35033 Hypertensive retinopathy, bilateral: Secondary | ICD-10-CM

## 2017-09-16 ENCOUNTER — Encounter: Payer: Self-pay | Admitting: Vascular Surgery

## 2017-09-16 ENCOUNTER — Other Ambulatory Visit: Payer: Self-pay

## 2017-09-16 ENCOUNTER — Ambulatory Visit (INDEPENDENT_AMBULATORY_CARE_PROVIDER_SITE_OTHER): Payer: PPO | Admitting: Vascular Surgery

## 2017-09-16 VITALS — BP 140/85 | HR 77 | Temp 97.4°F | Resp 20 | Ht 62.0 in | Wt 190.0 lb

## 2017-09-16 DIAGNOSIS — I728 Aneurysm of other specified arteries: Secondary | ICD-10-CM

## 2017-09-16 NOTE — Progress Notes (Signed)
She is a 72 year old female who returns for follow-up today.  She had coil embolization of a splenic artery aneurysm 6 years ago.  She has remained asymptomatic since that time.  She does have family history of abdominal aortic aneurysm and requests routine surveillance for this.  She denies any current abdominal or back pain.  She did have a CT scan recently from her primary care physician for some right lower quadrant pain and elevated white count but this resolved.  The CT was unremarkable.  Chronic medical problems include hypertension obesity arthritis of the hands all of which are currently stable.  Past Medical History:  Diagnosis Date  . Allergic rhinitis, cause unspecified   . Aneurysm of splenic artery ([place])    noted since 2002  . Anxiety   . Arthritis    hands  . Complication of anesthesia   . Depressive disorder, not elsewhere classified   . Esophageal reflux   . H/O hiatal hernia   . Hypertension   . Obesity, unspecified   . Pneumonia    03/27/11 "many many years ago"  . PONV (postoperative nausea and vomiting)    Past Surgical History:  Procedure Laterality Date  . ABDOMINAL HYSTERECTOMY  1980  . ARTERIAL ANEURYSM REPAIR  03/27/11   splenic artery  . BREAST BIOPSY  2003   right  . BREAST BIOPSY  2012   left  . CHOLECYSTECTOMY  2002  . EMBOLIZATION N/A 03/27/2011   Procedure: EMBOLIZATION;  Surgeon: Elam Dutch, MD;  Location: [facility] CATH LAB;  Service: Cardiovascular;  Laterality: N/A;  . LAPAROSCOPIC NISSEN FUNDOPLICATION  9030  . NASAL SEPTUM SURGERY  1985   deviated septum  . TONSILLECTOMY  1948  . TOTAL KNEE ARTHROPLASTY Right 04/20/2016   Procedure: RIGHT TOTAL KNEE ARTHROPLASTY;  Surgeon: Paralee Cancel, MD;  Location: [facility] ORS;  Service: Orthopedics;  Laterality: Right;   Current Outpatient Medications on File Prior to Visit  Medication Sig Dispense Refill  . Albuterol Sulfate (VENTOLIN HFA IN) Inhale into the lungs.    . ALPRAZolam (XANAX) 0.25 MG tablet Take 0.5  mg by mouth 2 (two) times daily as needed (FOR ANXIETY). For anxiety    . CALCIUM PO Take 1,200 mg by mouth daily.    . cetirizine (ZYRTEC) 10 MG tablet Take 10 mg by mouth daily.    . Cholecalciferol (VITAMIN D3) 2000 units TABS Take 2,000 Units by mouth daily.    Marland Kitchen DEXILANT 60 MG capsule Take 60 mg by mouth daily.     Marland Kitchen DILT-XR 180 MG 24 hr capsule Take 180 mg by mouth daily.     Marland Kitchen FLUoxetine (PROZAC) 20 MG tablet Take 20 mg by mouth daily.     . fluticasone (FLONASE) 50 MCG/ACT nasal spray Place 2 sprays into the nose daily.    . Fluticasone Furoate (ARNUITY ELLIPTA) 100 MCG/ACT AEPB Inhale 1 Dose into the lungs daily. (Patient taking differently: Inhale 2 puffs into the lungs daily. Pt indicates that she has Fluticasone Furoate 61mcg AEPB in lieu of 176mcg. She also indicated that she takes 2 puffs instead of 1) 3 each 1  . hydrochlorothiazide (HYDRODIURIL) 12.5 MG tablet Take 12.5 mg by mouth daily.     . Hydrocortisone Butyr Lipo Base (LOCOID LIPOCREAM) 0.1 % CREA Apply topically.    Marland Kitchen ketoconazole (NIZORAL) 2 % cream Apply 1 application topically 2 (two) times daily as needed. For skin rash/fungus.    Marland Kitchen MAGNESIUM PO Take 500 mg by mouth daily.    Marland Kitchen  methocarbamol (ROBAXIN) 500 MG tablet Take 1 tablet (500 mg total) by mouth every 6 (six) hours as needed for muscle spasms. 40 tablet 0  . Misc Natural Products (OSTEO BI-FLEX ADV TRIPLE ST PO) Take 2 tablets by mouth daily.    Marland Kitchen nystatin-triamcinolone (MYCOLOG II) cream Apply 1 application topically 2 (two) times daily as needed. For skin irritation/fungus.    . Omega-3 Fatty Acids (FISH OIL) 1200 MG CAPS Take 1,200 mg by mouth daily.     Earney Navy Bicarbonate (ZEGERID) 20-1100 MG CAPS capsule Take 1 capsule by mouth at bedtime.    . ondansetron (ZOFRAN) 4 MG tablet Take 1 tablet (4 mg total) by mouth every 8 (eight) hours as needed for nausea or vomiting. 20 tablet 0  . Polyethyl Glycol-Propyl Glycol (SYSTANE) 0.4-0.3 % SOLN Place 1  drop into both eyes 4 (four) times daily as needed (for dry eyes).    . polyethylene glycol (MIRALAX / GLYCOLAX) packet Take 17 g by mouth 2 (two) times daily. 14 each 0  . Probiotic Product (PROBIOTIC PO) Take 1 capsule by mouth daily.    . vitamin B-12 (CYANOCOBALAMIN) 1000 MCG tablet Take 1,000 mcg by mouth daily.    . celecoxib (CELEBREX) 100 MG capsule Take 100 mg by mouth daily as needed (for pain/inflammation.).     Marland Kitchen docusate sodium (COLACE) 100 MG capsule Take 1 capsule (100 mg total) by mouth 2 (two) times daily. (Patient not taking: Reported on 09/16/2017) 10 capsule 0   No current facility-administered medications on file prior to visit.     Physical exam:  Vitals:   09/16/17 1322  BP: 140/85  Pulse: 77  Resp: 20  Temp: (!) 97.4 F (36.3 C)  TempSrc: Oral  SpO2: 95%  Weight: 190 lb (86.2 kg)  Height: 5\' 2"  (1.575 m)   Abdomen: Soft nontender nondistended no bruit 2+ femoral pulses bilaterally  Extremities: 2+ dorsalis pedis pulses  Data: I reviewed the patient's CT angio abdomen and pelvis.  Shows a well excluded splenic artery aneurysm no evidence of aortic or iliac aneurysm.  Assessment: Thrombosed splenic artery aneurysm no evidence of aortoiliac aneurysms  Plan: Follow-up 5 years with repeat CT scan.  Ruta Hinds, MD [facility] Office: [phone] Pager: [phone]  Review of systems: She denies chest pain.  She denies shortness of breath.  She did have a left total knee replacement and states that she has recovered from this.

## 2017-09-27 DIAGNOSIS — H52221 Regular astigmatism, right eye: Secondary | ICD-10-CM | POA: Diagnosis not present

## 2017-09-27 DIAGNOSIS — H5212 Myopia, left eye: Secondary | ICD-10-CM | POA: Diagnosis not present

## 2017-09-27 DIAGNOSIS — H5211 Myopia, right eye: Secondary | ICD-10-CM | POA: Diagnosis not present

## 2017-09-27 DIAGNOSIS — H524 Presbyopia: Secondary | ICD-10-CM | POA: Diagnosis not present

## 2017-11-15 ENCOUNTER — Other Ambulatory Visit: Payer: Self-pay | Admitting: Internal Medicine

## 2017-11-15 DIAGNOSIS — Z1231 Encounter for screening mammogram for malignant neoplasm of breast: Secondary | ICD-10-CM

## 2017-12-13 DIAGNOSIS — R82998 Other abnormal findings in urine: Secondary | ICD-10-CM | POA: Diagnosis not present

## 2017-12-13 DIAGNOSIS — R7309 Other abnormal glucose: Secondary | ICD-10-CM | POA: Diagnosis not present

## 2017-12-13 DIAGNOSIS — E7849 Other hyperlipidemia: Secondary | ICD-10-CM | POA: Diagnosis not present

## 2017-12-13 DIAGNOSIS — I10 Essential (primary) hypertension: Secondary | ICD-10-CM | POA: Diagnosis not present

## 2017-12-22 DIAGNOSIS — F418 Other specified anxiety disorders: Secondary | ICD-10-CM | POA: Diagnosis not present

## 2017-12-22 DIAGNOSIS — Z6835 Body mass index (BMI) 35.0-35.9, adult: Secondary | ICD-10-CM | POA: Diagnosis not present

## 2017-12-22 DIAGNOSIS — F3341 Major depressive disorder, recurrent, in partial remission: Secondary | ICD-10-CM | POA: Diagnosis not present

## 2017-12-22 DIAGNOSIS — J449 Chronic obstructive pulmonary disease, unspecified: Secondary | ICD-10-CM | POA: Diagnosis not present

## 2017-12-22 DIAGNOSIS — N390 Urinary tract infection, site not specified: Secondary | ICD-10-CM | POA: Diagnosis not present

## 2017-12-22 DIAGNOSIS — I728 Aneurysm of other specified arteries: Secondary | ICD-10-CM | POA: Diagnosis not present

## 2017-12-22 DIAGNOSIS — B372 Candidiasis of skin and nail: Secondary | ICD-10-CM | POA: Diagnosis not present

## 2017-12-22 DIAGNOSIS — Z1389 Encounter for screening for other disorder: Secondary | ICD-10-CM | POA: Diagnosis not present

## 2017-12-22 DIAGNOSIS — Z0000 Encounter for general adult medical examination without abnormal findings: Secondary | ICD-10-CM | POA: Diagnosis not present

## 2017-12-22 DIAGNOSIS — R808 Other proteinuria: Secondary | ICD-10-CM | POA: Diagnosis not present

## 2017-12-22 DIAGNOSIS — J45998 Other asthma: Secondary | ICD-10-CM | POA: Diagnosis not present

## 2017-12-28 DIAGNOSIS — Z1212 Encounter for screening for malignant neoplasm of rectum: Secondary | ICD-10-CM | POA: Diagnosis not present

## 2018-01-24 DIAGNOSIS — Z86010 Personal history of colonic polyps: Secondary | ICD-10-CM | POA: Diagnosis not present

## 2018-01-24 DIAGNOSIS — R1031 Right lower quadrant pain: Secondary | ICD-10-CM | POA: Diagnosis not present

## 2018-01-24 DIAGNOSIS — Z800 Family history of malignant neoplasm of digestive organs: Secondary | ICD-10-CM | POA: Diagnosis not present

## 2018-01-24 DIAGNOSIS — K219 Gastro-esophageal reflux disease without esophagitis: Secondary | ICD-10-CM | POA: Diagnosis not present

## 2018-01-26 ENCOUNTER — Ambulatory Visit
Admission: RE | Admit: 2018-01-26 | Discharge: 2018-01-26 | Disposition: A | Discharge status: Home / Self Care | Payer: PPO | Source: Ambulatory Visit | Attending: Internal Medicine | Admitting: Internal Medicine

## 2018-01-26 DIAGNOSIS — Z1231 Encounter for screening mammogram for malignant neoplasm of breast: Secondary | ICD-10-CM

## 2018-01-27 DIAGNOSIS — M25511 Pain in right shoulder: Secondary | ICD-10-CM | POA: Diagnosis not present

## 2018-03-22 DIAGNOSIS — M25511 Pain in right shoulder: Secondary | ICD-10-CM | POA: Diagnosis not present

## 2018-06-03 DIAGNOSIS — M25511 Pain in right shoulder: Secondary | ICD-10-CM | POA: Diagnosis not present

## 2018-06-07 DIAGNOSIS — M25511 Pain in right shoulder: Secondary | ICD-10-CM | POA: Diagnosis not present

## 2018-06-07 DIAGNOSIS — M19011 Primary osteoarthritis, right shoulder: Secondary | ICD-10-CM | POA: Diagnosis not present

## 2018-06-27 DIAGNOSIS — I10 Essential (primary) hypertension: Secondary | ICD-10-CM | POA: Diagnosis not present

## 2018-06-27 DIAGNOSIS — Z800 Family history of malignant neoplasm of digestive organs: Secondary | ICD-10-CM | POA: Diagnosis not present

## 2018-06-27 DIAGNOSIS — R42 Dizziness and giddiness: Secondary | ICD-10-CM | POA: Diagnosis not present

## 2018-06-27 DIAGNOSIS — R7309 Other abnormal glucose: Secondary | ICD-10-CM | POA: Diagnosis not present

## 2018-06-27 DIAGNOSIS — M7551 Bursitis of right shoulder: Secondary | ICD-10-CM | POA: Diagnosis not present

## 2018-06-30 ENCOUNTER — Telehealth (HOSPITAL_COMMUNITY): Payer: Self-pay | Admitting: Rehabilitation

## 2018-06-30 NOTE — Telephone Encounter (Signed)

## 2018-07-01 ENCOUNTER — Other Ambulatory Visit: Payer: Self-pay | Admitting: Internal Medicine

## 2018-07-01 ENCOUNTER — Other Ambulatory Visit: Payer: Self-pay

## 2018-07-01 ENCOUNTER — Ambulatory Visit (HOSPITAL_COMMUNITY)
Admission: RE | Admit: 2018-07-01 | Discharge: 2018-07-01 | Disposition: A | Discharge status: Home / Self Care | Payer: PPO | Source: Ambulatory Visit | Attending: Family | Admitting: Family

## 2018-07-01 ENCOUNTER — Other Ambulatory Visit (HOSPITAL_COMMUNITY): Payer: Self-pay | Admitting: Internal Medicine

## 2018-07-01 DIAGNOSIS — H539 Unspecified visual disturbance: Secondary | ICD-10-CM

## 2018-08-19 DIAGNOSIS — M8588 Other specified disorders of bone density and structure, other site: Secondary | ICD-10-CM | POA: Diagnosis not present

## 2018-08-19 DIAGNOSIS — Z6835 Body mass index (BMI) 35.0-35.9, adult: Secondary | ICD-10-CM | POA: Diagnosis not present

## 2018-08-19 DIAGNOSIS — Z124 Encounter for screening for malignant neoplasm of cervix: Secondary | ICD-10-CM | POA: Diagnosis not present

## 2018-08-19 DIAGNOSIS — N958 Other specified menopausal and perimenopausal disorders: Secondary | ICD-10-CM | POA: Diagnosis not present

## 2018-08-19 DIAGNOSIS — Z1272 Encounter for screening for malignant neoplasm of vagina: Secondary | ICD-10-CM | POA: Diagnosis not present

## 2018-09-19 ENCOUNTER — Encounter (INDEPENDENT_AMBULATORY_CARE_PROVIDER_SITE_OTHER): Payer: PPO | Admitting: Ophthalmology

## 2018-10-04 ENCOUNTER — Encounter (INDEPENDENT_AMBULATORY_CARE_PROVIDER_SITE_OTHER): Payer: PPO | Admitting: Ophthalmology

## 2018-10-04 ENCOUNTER — Other Ambulatory Visit: Payer: Self-pay

## 2018-10-04 DIAGNOSIS — D3131 Benign neoplasm of right choroid: Secondary | ICD-10-CM

## 2018-10-04 DIAGNOSIS — H35033 Hypertensive retinopathy, bilateral: Secondary | ICD-10-CM

## 2018-10-04 DIAGNOSIS — H43813 Vitreous degeneration, bilateral: Secondary | ICD-10-CM

## 2018-10-04 DIAGNOSIS — I10 Essential (primary) hypertension: Secondary | ICD-10-CM

## 2018-10-13 DIAGNOSIS — M19011 Primary osteoarthritis, right shoulder: Secondary | ICD-10-CM | POA: Diagnosis not present

## 2018-10-13 DIAGNOSIS — M25511 Pain in right shoulder: Secondary | ICD-10-CM | POA: Diagnosis not present

## 2018-10-18 DIAGNOSIS — T07XXXA Unspecified multiple injuries, initial encounter: Secondary | ICD-10-CM | POA: Diagnosis not present

## 2018-10-18 DIAGNOSIS — T148XXA Other injury of unspecified body region, initial encounter: Secondary | ICD-10-CM | POA: Diagnosis not present

## 2018-11-22 DIAGNOSIS — J449 Chronic obstructive pulmonary disease, unspecified: Secondary | ICD-10-CM | POA: Diagnosis not present

## 2018-11-22 DIAGNOSIS — R05 Cough: Secondary | ICD-10-CM | POA: Diagnosis not present

## 2018-11-22 DIAGNOSIS — R0600 Dyspnea, unspecified: Secondary | ICD-10-CM | POA: Diagnosis not present

## 2018-11-22 DIAGNOSIS — J069 Acute upper respiratory infection, unspecified: Secondary | ICD-10-CM | POA: Diagnosis not present

## 2018-11-22 DIAGNOSIS — I10 Essential (primary) hypertension: Secondary | ICD-10-CM | POA: Diagnosis not present

## 2018-12-09 ENCOUNTER — Other Ambulatory Visit: Payer: Self-pay

## 2018-12-09 DIAGNOSIS — Z20822 Contact with and (suspected) exposure to covid-19: Secondary | ICD-10-CM

## 2018-12-12 DIAGNOSIS — H01114 Allergic dermatitis of left upper eyelid: Secondary | ICD-10-CM | POA: Diagnosis not present

## 2018-12-12 DIAGNOSIS — H01115 Allergic dermatitis of left lower eyelid: Secondary | ICD-10-CM | POA: Diagnosis not present

## 2018-12-12 LAB — NOVEL CORONAVIRUS, NAA: SARS-CoV-2, NAA: NOT DETECTED

## 2018-12-20 DIAGNOSIS — R7309 Other abnormal glucose: Secondary | ICD-10-CM | POA: Diagnosis not present

## 2018-12-20 DIAGNOSIS — E7849 Other hyperlipidemia: Secondary | ICD-10-CM | POA: Diagnosis not present

## 2018-12-21 DIAGNOSIS — I10 Essential (primary) hypertension: Secondary | ICD-10-CM | POA: Diagnosis not present

## 2018-12-21 DIAGNOSIS — R82998 Other abnormal findings in urine: Secondary | ICD-10-CM | POA: Diagnosis not present

## 2018-12-27 DIAGNOSIS — I728 Aneurysm of other specified arteries: Secondary | ICD-10-CM | POA: Diagnosis not present

## 2018-12-27 DIAGNOSIS — E785 Hyperlipidemia, unspecified: Secondary | ICD-10-CM | POA: Diagnosis not present

## 2018-12-27 DIAGNOSIS — I10 Essential (primary) hypertension: Secondary | ICD-10-CM | POA: Diagnosis not present

## 2018-12-27 DIAGNOSIS — J449 Chronic obstructive pulmonary disease, unspecified: Secondary | ICD-10-CM | POA: Diagnosis not present

## 2018-12-27 DIAGNOSIS — F3341 Major depressive disorder, recurrent, in partial remission: Secondary | ICD-10-CM | POA: Diagnosis not present

## 2018-12-27 DIAGNOSIS — Z86010 Personal history of colonic polyps: Secondary | ICD-10-CM | POA: Diagnosis not present

## 2018-12-27 DIAGNOSIS — R809 Proteinuria, unspecified: Secondary | ICD-10-CM | POA: Diagnosis not present

## 2018-12-27 DIAGNOSIS — Z1331 Encounter for screening for depression: Secondary | ICD-10-CM | POA: Diagnosis not present

## 2018-12-27 DIAGNOSIS — Z0000 Encounter for general adult medical examination without abnormal findings: Secondary | ICD-10-CM | POA: Diagnosis not present

## 2018-12-27 DIAGNOSIS — K219 Gastro-esophageal reflux disease without esophagitis: Secondary | ICD-10-CM | POA: Diagnosis not present

## 2018-12-27 DIAGNOSIS — J45909 Unspecified asthma, uncomplicated: Secondary | ICD-10-CM | POA: Diagnosis not present

## 2019-01-11 DIAGNOSIS — K921 Melena: Secondary | ICD-10-CM | POA: Diagnosis not present

## 2019-01-27 DIAGNOSIS — Z86010 Personal history of colonic polyps: Secondary | ICD-10-CM | POA: Diagnosis not present

## 2019-01-27 DIAGNOSIS — Z800 Family history of malignant neoplasm of digestive organs: Secondary | ICD-10-CM | POA: Diagnosis not present

## 2019-01-27 DIAGNOSIS — K219 Gastro-esophageal reflux disease without esophagitis: Secondary | ICD-10-CM | POA: Diagnosis not present

## 2019-01-27 DIAGNOSIS — R195 Other fecal abnormalities: Secondary | ICD-10-CM | POA: Diagnosis not present

## 2019-01-31 DIAGNOSIS — M25511 Pain in right shoulder: Secondary | ICD-10-CM | POA: Diagnosis not present

## 2019-02-01 DIAGNOSIS — Z1159 Encounter for screening for other viral diseases: Secondary | ICD-10-CM | POA: Diagnosis not present

## 2019-02-06 DIAGNOSIS — R195 Other fecal abnormalities: Secondary | ICD-10-CM | POA: Diagnosis not present

## 2019-02-06 DIAGNOSIS — Q438 Other specified congenital malformations of intestine: Secondary | ICD-10-CM | POA: Diagnosis not present

## 2019-02-06 DIAGNOSIS — K5730 Diverticulosis of large intestine without perforation or abscess without bleeding: Secondary | ICD-10-CM | POA: Diagnosis not present

## 2019-02-06 DIAGNOSIS — D122 Benign neoplasm of ascending colon: Secondary | ICD-10-CM | POA: Diagnosis not present

## 2019-02-08 DIAGNOSIS — D122 Benign neoplasm of ascending colon: Secondary | ICD-10-CM | POA: Diagnosis not present

## 2019-02-15 ENCOUNTER — Ambulatory Visit: Payer: PPO

## 2019-02-24 ENCOUNTER — Other Ambulatory Visit: Payer: Self-pay | Admitting: Internal Medicine

## 2019-02-24 ENCOUNTER — Ambulatory Visit: Payer: PPO

## 2019-02-24 DIAGNOSIS — Z1231 Encounter for screening mammogram for malignant neoplasm of breast: Secondary | ICD-10-CM

## 2019-02-26 ENCOUNTER — Ambulatory Visit: Payer: PPO

## 2019-03-02 DIAGNOSIS — L309 Dermatitis, unspecified: Secondary | ICD-10-CM | POA: Diagnosis not present

## 2019-03-13 DIAGNOSIS — L089 Local infection of the skin and subcutaneous tissue, unspecified: Secondary | ICD-10-CM | POA: Diagnosis not present

## 2019-03-13 DIAGNOSIS — L2089 Other atopic dermatitis: Secondary | ICD-10-CM | POA: Diagnosis not present

## 2019-04-06 ENCOUNTER — Ambulatory Visit: Payer: PPO

## 2019-04-12 DIAGNOSIS — R1012 Left upper quadrant pain: Secondary | ICD-10-CM | POA: Diagnosis not present

## 2019-04-12 DIAGNOSIS — K219 Gastro-esophageal reflux disease without esophagitis: Secondary | ICD-10-CM | POA: Diagnosis not present

## 2019-04-12 DIAGNOSIS — Z86010 Personal history of colonic polyps: Secondary | ICD-10-CM | POA: Diagnosis not present

## 2019-04-12 DIAGNOSIS — R1310 Dysphagia, unspecified: Secondary | ICD-10-CM | POA: Diagnosis not present

## 2019-04-12 DIAGNOSIS — L2089 Other atopic dermatitis: Secondary | ICD-10-CM | POA: Diagnosis not present

## 2019-04-12 DIAGNOSIS — K2970 Gastritis, unspecified, without bleeding: Secondary | ICD-10-CM | POA: Diagnosis not present

## 2019-04-14 ENCOUNTER — Other Ambulatory Visit: Payer: Self-pay | Admitting: Obstetrics and Gynecology

## 2019-04-14 ENCOUNTER — Other Ambulatory Visit: Payer: Self-pay | Admitting: Physician Assistant

## 2019-04-14 DIAGNOSIS — R1310 Dysphagia, unspecified: Secondary | ICD-10-CM

## 2019-04-21 ENCOUNTER — Ambulatory Visit
Admission: RE | Admit: 2019-04-21 | Discharge: 2019-04-21 | Disposition: A | Discharge status: Home / Self Care | Payer: PPO | Source: Ambulatory Visit | Attending: Physician Assistant | Admitting: Physician Assistant

## 2019-04-21 DIAGNOSIS — R1310 Dysphagia, unspecified: Secondary | ICD-10-CM

## 2019-04-21 DIAGNOSIS — K224 Dyskinesia of esophagus: Secondary | ICD-10-CM | POA: Diagnosis not present

## 2019-04-27 DIAGNOSIS — M25511 Pain in right shoulder: Secondary | ICD-10-CM | POA: Diagnosis not present

## 2019-04-27 DIAGNOSIS — M545 Low back pain: Secondary | ICD-10-CM | POA: Diagnosis not present

## 2019-05-08 ENCOUNTER — Other Ambulatory Visit: Payer: Self-pay

## 2019-05-08 ENCOUNTER — Ambulatory Visit
Admission: RE | Admit: 2019-05-08 | Discharge: 2019-05-08 | Disposition: A | Discharge status: Home / Self Care | Payer: PPO | Source: Ambulatory Visit | Attending: Internal Medicine | Admitting: Internal Medicine

## 2019-05-08 DIAGNOSIS — Z1231 Encounter for screening mammogram for malignant neoplasm of breast: Secondary | ICD-10-CM | POA: Diagnosis not present

## 2019-05-09 ENCOUNTER — Other Ambulatory Visit: Payer: Self-pay | Admitting: Internal Medicine

## 2019-05-09 DIAGNOSIS — R928 Other abnormal and inconclusive findings on diagnostic imaging of breast: Secondary | ICD-10-CM

## 2019-05-16 ENCOUNTER — Ambulatory Visit
Admission: RE | Admit: 2019-05-16 | Discharge: 2019-05-16 | Disposition: A | Discharge status: Home / Self Care | Payer: PPO | Source: Ambulatory Visit | Attending: Internal Medicine | Admitting: Internal Medicine

## 2019-05-16 ENCOUNTER — Other Ambulatory Visit: Payer: Self-pay | Admitting: Internal Medicine

## 2019-05-16 ENCOUNTER — Other Ambulatory Visit: Payer: Self-pay

## 2019-05-16 DIAGNOSIS — R928 Other abnormal and inconclusive findings on diagnostic imaging of breast: Secondary | ICD-10-CM

## 2019-05-16 DIAGNOSIS — R921 Mammographic calcification found on diagnostic imaging of breast: Secondary | ICD-10-CM

## 2019-05-23 ENCOUNTER — Other Ambulatory Visit: Payer: Self-pay

## 2019-05-23 ENCOUNTER — Ambulatory Visit
Admission: RE | Admit: 2019-05-23 | Discharge: 2019-05-23 | Disposition: A | Discharge status: Home / Self Care | Payer: PPO | Source: Ambulatory Visit | Attending: Internal Medicine | Admitting: Internal Medicine

## 2019-05-23 DIAGNOSIS — R921 Mammographic calcification found on diagnostic imaging of breast: Secondary | ICD-10-CM

## 2019-05-23 DIAGNOSIS — N6091 Unspecified benign mammary dysplasia of right breast: Secondary | ICD-10-CM | POA: Diagnosis not present

## 2019-05-24 DIAGNOSIS — Z86010 Personal history of colonic polyps: Secondary | ICD-10-CM | POA: Diagnosis not present

## 2019-05-24 DIAGNOSIS — K224 Dyskinesia of esophagus: Secondary | ICD-10-CM | POA: Diagnosis not present

## 2019-05-24 DIAGNOSIS — K2970 Gastritis, unspecified, without bleeding: Secondary | ICD-10-CM | POA: Diagnosis not present

## 2019-05-24 DIAGNOSIS — K219 Gastro-esophageal reflux disease without esophagitis: Secondary | ICD-10-CM | POA: Diagnosis not present

## 2019-06-09 ENCOUNTER — Ambulatory Visit: Payer: Self-pay | Admitting: Surgery

## 2019-06-09 DIAGNOSIS — N6091 Unspecified benign mammary dysplasia of right breast: Secondary | ICD-10-CM

## 2019-06-09 NOTE — H&P (Signed)
Tasha Hunt Appointment: 06/09/2019 10:10 AM Location: [facility] Patient #: [id number] DOB: 17-Mar-1945 Divorced / Language: Tasha Hunt / Race: White Female  History of Present Illness Tasha Moores A. Rheagan Nayak MD; 06/09/2019 1:02 PM) Patient words: Patient sent at the request of the [facility] [place] due to right breast microcalcifications detected on routine screening mammography. A cluster of 8 mm punctate calcifications noted in the upper central right breast. Core body showed atypical ductal hyperplasia. No family history of breast cancer. Patient denies history of breast pain, nipple discharge or change in the appearance of either breast.   Screening recall for right breast calcifications.  EXAM: DIGITAL DIAGNOSTIC RIGHT MAMMOGRAM WITH CAD  COMPARISON: Previous exams.  ACR Breast Density Category b: There are scattered areas of fibroglandular density.  FINDINGS: Spot compression magnification views were performed over the upper central right breast. There is a 0.8 cm group of punctate calcifications felt to be likely benign.  Mammographic images were processed with CAD.  IMPRESSION: Probably benign 0.8 cm group of punctate calcifications in the upper central right breast. The patient prefers definitive diagnosis and therefore stereotactic guided core biopsy will be scheduled.  RECOMMENDATION: Stereotactic guided biopsy of the 0.8 cm group of calcifications in the upper central right breast.  I have discussed the findings and recommendations with the patient. If applicable, a reminder letter will be sent to the patient regarding the next appointment.  BI-RADS CATEGORY 3: Probably benign.   Electronically Signed By: Tasha Hunt M.D. On: 05/16/2019 14:34         Diagnosis Breast, right, needle core biopsy, upper central, coil clip - ATYPICAL DUCTAL HYPERPLASIA WITH CALCIFICATIONS - SEE COMMENT.  The patient is a 74 year old  female.   Past Surgical History (Tasha Hunt, [place]; 06/09/2019 10:01 AM) Aneurysm Repair Breast Biopsy Right. multiple Cataract Surgery Bilateral. Colon Polyp Removal - Colonoscopy Gallbladder Surgery - Laparoscopic Hysterectomy (not due to cancer) - Partial Knee Surgery Right. Nissen Fundoplication Oral Surgery Tonsillectomy  Diagnostic Studies History (Tasha Hunt, [place]; 06/09/2019 10:01 AM) Colonoscopy within last year Mammogram 1-3 years ago  Allergies (Tasha Hunt, [place]; 06/09/2019 10:02 AM) Morphine Sulfate *ANALGESICS - OPIOID* Allergies Reconciled  Medication History (Tasha Hunt, [place]; 06/09/2019 10:05 AM) Tasha Hunt (100MCG/ACT Aero Pow Br Act, Inhalation) Active. Proventil HFA (108 (90 Base)MCG/ACT Aerosol Soln, Inhalation) Active. ALPRAZolam (0.25MG  Tablet, Oral) Active. hydroCHLOROthiazide (12.5MG  Tablet, Oral) Active. Dilt-XR (180MG  Capsule ER 24HR, Oral) Active. Fluticasone Propionate (50MCG/ACT Suspension, Nasal) Active. RABEprazole Sodium (20MG  Tablet DR, Oral) Active. Atorvastatin Calcium (10MG  Tablet, Oral) Active. DULoxetine HCl (60MG  Capsule DR Part, Oral) Active. Meloxicam (15MG  Tablet, Oral) Active. Ketoconazole (2% Cream, External) Active. Fluticasone Propionate (0.05% Cream, External) Active. Medications Reconciled  Social History (Tasha Hunt, [place]; 06/09/2019 10:01 AM) Caffeine use Carbonated beverages, Coffee, Tea. No alcohol use No drug use Tobacco use Never smoker.  Family History (Tasha Hunt, [place]; 06/09/2019 10:01 AM) Arthritis Father, Mother. Colon Cancer Family Members In General. Colon Polyps Family Members In General. Diabetes Mellitus Father. Heart Disease Family Members In General, Father, Mother. Hypertension Family Members In General, Father, Mother. Melanoma Mother. Prostate Cancer Father.  Pregnancy / Birth History (Tasha Hunt, [place]; 06/09/2019 10:01  AM) Age at menarche 84 years. Contraceptive History Oral contraceptives. Gravida 2 Maternal age 75-25 Para 51  Other Problems (Tasha Hunt, [place]; 06/09/2019 10:01 AM) Anxiety Disorder Arthritis Asthma Back Pain Cholelithiasis Chronic Obstructive Lung Disease Depression Gastroesophageal Reflux Disease Hemorrhoids High blood pressure Hypercholesterolemia Inguinal  Hernia     Review of Systems (Tasha A. Brown RMA; 06/09/2019 10:01 AM) General Not Present- Appetite Loss, Chills, Fatigue, Fever, Night Sweats, Weight Gain and Weight Loss. Skin Present- Dryness. Not Present- Change in Wart/Mole, Hives, Jaundice, New Lesions, Non-Healing Wounds, Rash and Ulcer. HEENT Present- Hearing Loss and Seasonal Allergies. Not Present- Earache, Hoarseness, Nose Bleed, Oral Ulcers, Ringing in the Ears, Sinus Pain, Sore Throat, Visual Disturbances, Wears glasses/contact lenses and Yellow Eyes. Respiratory Not Present- Bloody sputum, Chronic Cough, Difficulty Breathing, Snoring and Wheezing. Breast Not Present- Breast Mass, Breast Pain, Nipple Discharge and Skin Changes. Cardiovascular Not Present- Chest Pain, Difficulty Breathing Lying Down, Leg Cramps, Palpitations, Rapid Heart Rate, Shortness of Breath and Swelling of Extremities. Gastrointestinal Present- Hemorrhoids. Not Present- Abdominal Pain, Bloating, Bloody Stool, Change in Bowel Habits, Chronic diarrhea, Constipation, Difficulty Swallowing, Excessive gas, Gets full quickly at meals, Indigestion, Nausea, Rectal Pain and Vomiting. Female Genitourinary Not Present- Frequency, Nocturia, Painful Urination, Pelvic Pain and Urgency. Musculoskeletal Not Present- Back Pain, Joint Pain, Joint Stiffness, Muscle Pain, Muscle Weakness and Swelling of Extremities. Neurological Not Present- Decreased Memory, Fainting, Headaches, Numbness, Seizures, Tingling, Tremor, Trouble walking and Weakness. Psychiatric Present- Anxiety and  Depression. Not Present- Bipolar, Change in Sleep Pattern, Fearful and Frequent crying. Endocrine Not Present- Cold Intolerance, Excessive Hunger, Hair Changes, Heat Intolerance, Hot flashes and New Diabetes. Hematology Present- Easy Bruising. Not Present- Blood Thinners, Excessive bleeding, Gland problems, HIV and Persistent Infections.  Vitals (Tasha A. Brown RMA; 06/09/2019 10:05 AM) 06/09/2019 10:05 AM Weight: 183.6 lb Height: 63in Body Surface Area: 1.86 m Body Mass Index: 32.52 kg/m  Temp.: 98.40F  Pulse: 88 (Regular)  BP: 134/88(Sitting, Left Arm, Standard)        Physical Exam (Anterrio Mccleery A. Yeraldine Forney MD; 06/09/2019 1:03 PM)  General Mental Status-Alert. General Appearance-Consistent with stated age. Hydration-Well hydrated. Voice-Normal.  Head and Neck Head-normocephalic, atraumatic with no lesions or palpable masses. Trachea-midline. Thyroid Gland Characteristics - normal size and consistency.  Eye Eyeball - Bilateral-Extraocular movements intact. Sclera/Conjunctiva - Bilateral-No scleral icterus.  Chest and Lung Exam Chest and lung exam reveals -quiet, even and easy respiratory effort with no use of accessory muscles and on auscultation, normal breath sounds, no adventitious sounds and normal vocal resonance. Inspection Chest Wall - Normal. Back - normal.  Breast Breast - Left-Symmetric, Non Tender, No Biopsy scars, no Dimpling - Left, No Inflammation, No Lumpectomy scars, No Mastectomy scars, No Peau d' Orange. Breast - Right-Symmetric, Non Tender, No Biopsy scars, no Dimpling - Right, No Inflammation, No Lumpectomy scars, No Mastectomy scars, No Peau d' Orange. Breast Lump-No Palpable Breast Mass.  Cardiovascular Cardiovascular examination reveals -normal heart sounds, regular rate and rhythm with no murmurs and normal pedal pulses bilaterally.  Abdomen Inspection Inspection of the abdomen reveals - No Hernias. Skin -  Scar - no surgical scars. Palpation/Percussion Palpation and Percussion of the abdomen reveal - Soft, Non Tender, No Rebound tenderness, No Rigidity (guarding) and No hepatosplenomegaly. Auscultation Auscultation of the abdomen reveals - Bowel sounds normal.  Neurologic Neurologic evaluation reveals -alert and oriented x 3 with no impairment of recent or remote memory. Mental Status-Normal.  Musculoskeletal Normal Exam - Left-Upper Extremity Strength Normal and Lower Extremity Strength Normal. Normal Exam - Right-Upper Extremity Strength Normal and Lower Extremity Strength Normal.  Lymphatic Head & Neck  General Head & Neck Lymphatics: Bilateral - Description - Normal. Axillary  General Axillary Region: Bilateral - Description - Normal. Tenderness - Non Tender. Femoral & Inguinal  Generalized Femoral &  Inguinal Lymphatics: Bilateral - Description - Normal. Tenderness - Non Tender.    Assessment & Plan (Inaya Gillham A. Vishnu Moeller MD; 06/09/2019 1:06 PM)  ATYPICAL DUCTAL HYPERPLASIA OF RIGHT BREAST (N60.91) Impression: Discussed right breast seed localized lumpectomy due to potential upgrade risk of 20%. Total time 45 minutes to discuss surgery, examined the patient, review mammogram, review pathology, review chart and documentation Discussed observation is either option. Discussed high risk potential was well high risk prevention strategies today she is opted to undergo right breast seed lumpectomy. Risk of lumpectomy include bleeding, infection, seroma, more surgery, use of seed/wire, wound care, cosmetic deformity and the need for other treatments, death , blood clots, death. Pt agrees to proceed.  Current Plans Pt Education - [facility] Free Text Education/Instructions: discussed with patient and provided information. Pt Education - [facility] Breast Biopsy HCI: discussed with patient and provided information.

## 2019-06-09 NOTE — H&P (View-Only) (Signed)
Tasha Hunt Appointment: 06/09/2019 10:10 AM Location: [facility] Patient #: [id number] DOB: 04/11/1945 Divorced / Language: Tasha Hunt / Race: White Female  History of Present Illness Tasha Hunt A. Tasha Rude MD; 06/09/2019 1:02 PM) Patient words: Patient sent at the request of the [facility] [place] due to right breast microcalcifications detected on routine screening mammography. A cluster of 8 mm punctate calcifications noted in the upper central right breast. Core body showed atypical ductal hyperplasia. No family history of breast cancer. Patient denies history of breast pain, nipple discharge or change in the appearance of either breast.   Screening recall for right breast calcifications.  EXAM: DIGITAL DIAGNOSTIC RIGHT MAMMOGRAM WITH CAD  COMPARISON: Previous exams.  ACR Breast Density Category b: There are scattered areas of fibroglandular density.  FINDINGS: Spot compression magnification views were performed over the upper central right breast. There is a 0.8 cm group of punctate calcifications felt to be likely benign.  Mammographic images were processed with CAD.  IMPRESSION: Probably benign 0.8 cm group of punctate calcifications in the upper central right breast. The patient prefers definitive diagnosis and therefore stereotactic guided core biopsy will be scheduled.  RECOMMENDATION: Stereotactic guided biopsy of the 0.8 cm group of calcifications in the upper central right breast.  I have discussed the findings and recommendations with the patient. If applicable, a reminder letter will be sent to the patient regarding the next appointment.  BI-RADS CATEGORY 3: Probably benign.   Electronically Signed By: [name] M.D. On: 05/16/2019 14:34         Diagnosis Breast, right, needle core biopsy, upper central, coil clip - ATYPICAL DUCTAL HYPERPLASIA WITH CALCIFICATIONS - SEE COMMENT.  The patient is a 74 year old  female.   Past Surgical History (Tasha Hunt, Tasha Hunt; 06/09/2019 10:01 AM) Aneurysm Repair Breast Biopsy Right. multiple Cataract Surgery Bilateral. Colon Polyp Removal - Colonoscopy Gallbladder Surgery - Laparoscopic Hysterectomy (not due to cancer) - Partial Knee Surgery Right. Nissen Fundoplication Oral Surgery Tonsillectomy  Diagnostic Studies History (Tasha Hunt, Tasha Hunt; 06/09/2019 10:01 AM) Colonoscopy within last year Mammogram 1-3 years ago  Allergies (Tasha Hunt, Tasha Hunt; 06/09/2019 10:02 AM) Morphine Sulfate *ANALGESICS - OPIOID* Allergies Reconciled  Medication History (Tasha Hunt, Tasha Hunt; 06/09/2019 10:05 AM) [name] (100MCG/ACT Aero Pow Br Act, Inhalation) Active. Proventil HFA (108 (90 Base)MCG/ACT Aerosol Soln, Inhalation) Active. ALPRAZolam (0.25MG  Tablet, Oral) Active. hydroCHLOROthiazide (12.5MG  Tablet, Oral) Active. Dilt-XR (180MG  Capsule ER 24HR, Oral) Active. Fluticasone Propionate (50MCG/ACT Suspension, Nasal) Active. RABEprazole Sodium (20MG  Tablet DR, Oral) Active. Atorvastatin Calcium (10MG  Tablet, Oral) Active. DULoxetine HCl (60MG  Capsule DR Part, Oral) Active. Meloxicam (15MG  Tablet, Oral) Active. Ketoconazole (2% Cream, External) Active. Fluticasone Propionate (0.05% Cream, External) Active. Medications Reconciled  Social History (Tasha Hunt, Tasha Hunt; 06/09/2019 10:01 AM) Caffeine use Carbonated beverages, Coffee, Tea. No alcohol use No drug use Tobacco use Never smoker.  Family History (Tasha Hunt, Tasha Hunt; 06/09/2019 10:01 AM) Arthritis Father, Mother. Colon Cancer Family Members In General. Colon Polyps Family Members In General. Diabetes Mellitus Father. Heart Disease Family Members In General, Father, Mother. Hypertension Family Members In General, Father, Mother. Melanoma Mother. Prostate Cancer Father.  Pregnancy / Birth History (Tasha Hunt, Tasha Hunt; 06/09/2019 10:01  AM) Age at menarche 59 years. Contraceptive History Oral contraceptives. Gravida 2 Maternal age 59-25 Para 55  Other Problems (Tasha Hunt, Tasha Hunt; 06/09/2019 10:01 AM) Anxiety Disorder Arthritis Asthma Back Pain Cholelithiasis Chronic Obstructive Lung Disease Depression Gastroesophageal Reflux Disease Hemorrhoids High blood pressure Hypercholesterolemia Inguinal  Hernia     Review of Systems (Tasha A. Tasha Hunt; 06/09/2019 10:01 AM) General Not Present- Appetite Loss, Chills, Fatigue, Fever, Night Sweats, Weight Gain and Weight Loss. Skin Present- Dryness. Not Present- Change in Wart/Mole, Hives, Jaundice, New Lesions, Non-Healing Wounds, Rash and Ulcer. HEENT Present- Hearing Loss and Seasonal Allergies. Not Present- Earache, Hoarseness, Nose Bleed, Oral Ulcers, Ringing in the Ears, Sinus Pain, Sore Throat, Visual Disturbances, Wears glasses/contact lenses and Yellow Eyes. Respiratory Not Present- Bloody sputum, Chronic Cough, Difficulty Breathing, Snoring and Wheezing. Breast Not Present- Breast Mass, Breast Pain, Nipple Discharge and Skin Changes. Cardiovascular Not Present- Chest Pain, Difficulty Breathing Lying Down, Leg Cramps, Palpitations, Rapid Heart Rate, Shortness of Breath and Swelling of Extremities. Gastrointestinal Present- Hemorrhoids. Not Present- Abdominal Pain, Bloating, Bloody Stool, Change in Bowel Habits, Chronic diarrhea, Constipation, Difficulty Swallowing, Excessive gas, Gets full quickly at meals, Indigestion, Nausea, Rectal Pain and Vomiting. Female Genitourinary Not Present- Frequency, Nocturia, Painful Urination, Pelvic Pain and Urgency. Musculoskeletal Not Present- Back Pain, Joint Pain, Joint Stiffness, Muscle Pain, Muscle Weakness and Swelling of Extremities. Neurological Not Present- Decreased Memory, Fainting, Headaches, Numbness, Seizures, Tingling, Tremor, Trouble walking and Weakness. Psychiatric Present- Anxiety and  Depression. Not Present- Bipolar, Change in Sleep Pattern, Fearful and Frequent crying. Endocrine Not Present- Cold Intolerance, Excessive Hunger, Hair Changes, Heat Intolerance, Hot flashes and New Diabetes. Hematology Present- Easy Bruising. Not Present- Blood Thinners, Excessive bleeding, Gland problems, HIV and Persistent Infections.  Vitals (Tasha A. Tasha Hunt; 06/09/2019 10:05 AM) 06/09/2019 10:05 AM Weight: 183.6 lb Height: 63in Body Surface Area: 1.86 m Body Mass Index: 32.52 kg/m  Temp.: 98.74F  Pulse: 88 (Regular)  BP: 134/88(Sitting, Left Arm, Standard)        Physical Exam (Cadynce Garrette A. Genavieve Mangiapane MD; 06/09/2019 1:03 PM)  General Mental Status-Alert. General Appearance-Consistent with stated age. Hydration-Well hydrated. Voice-Normal.  Head and Neck Head-normocephalic, atraumatic with no lesions or palpable masses. Trachea-midline. Thyroid Gland Characteristics - normal size and consistency.  Eye Eyeball - Bilateral-Extraocular movements intact. Sclera/Conjunctiva - Bilateral-No scleral icterus.  Chest and Lung Exam Chest and lung exam reveals -quiet, even and easy respiratory effort with no use of accessory muscles and on auscultation, normal breath sounds, no adventitious sounds and normal vocal resonance. Inspection Chest Wall - Normal. Back - normal.  Breast Breast - Left-Symmetric, Non Tender, No Biopsy scars, no Dimpling - Left, No Inflammation, No Lumpectomy scars, No Mastectomy scars, No Peau d' Orange. Breast - Right-Symmetric, Non Tender, No Biopsy scars, no Dimpling - Right, No Inflammation, No Lumpectomy scars, No Mastectomy scars, No Peau d' Orange. Breast Lump-No Palpable Breast Mass.  Cardiovascular Cardiovascular examination reveals -normal heart sounds, regular rate and rhythm with no murmurs and normal pedal pulses bilaterally.  Abdomen Inspection Inspection of the abdomen reveals - No Hernias. Skin -  Scar - no surgical scars. Palpation/Percussion Palpation and Percussion of the abdomen reveal - Soft, Non Tender, No Rebound tenderness, No Rigidity (guarding) and No hepatosplenomegaly. Auscultation Auscultation of the abdomen reveals - Bowel sounds normal.  Neurologic Neurologic evaluation reveals -alert and oriented x 3 with no impairment of recent or remote memory. Mental Status-Normal.  Musculoskeletal Normal Exam - Left-Upper Extremity Strength Normal and Lower Extremity Strength Normal. Normal Exam - Right-Upper Extremity Strength Normal and Lower Extremity Strength Normal.  Lymphatic Head & Neck  General Head & Neck Lymphatics: Bilateral - Description - Normal. Axillary  General Axillary Region: Bilateral - Description - Normal. Tenderness - Non Tender. Femoral & Inguinal  Generalized Femoral &  Inguinal Lymphatics: Bilateral - Description - Normal. Tenderness - Non Tender.    Assessment & Plan (Jayonna Meyering A. Matelyn Antonelli MD; 06/09/2019 1:06 PM)  ATYPICAL DUCTAL HYPERPLASIA OF RIGHT BREAST (N60.91) Impression: Discussed right breast seed localized lumpectomy due to potential upgrade risk of 20%. Total time 45 minutes to discuss surgery, examined the patient, review mammogram, review pathology, review chart and documentation Discussed observation is either option. Discussed high risk potential was well high risk prevention strategies today she is opted to undergo right breast seed lumpectomy. Risk of lumpectomy include bleeding, infection, seroma, more surgery, use of seed/wire, wound care, cosmetic deformity and the need for other treatments, death , blood clots, death. Pt agrees to proceed.  Current Plans Pt Education - [facility] Free Text Education/Instructions: discussed with patient and provided information. Pt Education - [facility] Breast Biopsy HCI: discussed with patient and provided information.

## 2019-06-15 ENCOUNTER — Other Ambulatory Visit: Payer: Self-pay | Admitting: Surgery

## 2019-06-15 ENCOUNTER — Ambulatory Visit: Payer: Self-pay | Admitting: Surgery

## 2019-06-15 DIAGNOSIS — N6310 Unspecified lump in the right breast, unspecified quadrant: Secondary | ICD-10-CM

## 2019-06-23 ENCOUNTER — Ambulatory Visit: Payer: Self-pay | Admitting: Surgery

## 2019-06-23 DIAGNOSIS — N6310 Unspecified lump in the right breast, unspecified quadrant: Secondary | ICD-10-CM

## 2019-06-26 DIAGNOSIS — N390 Urinary tract infection, site not specified: Secondary | ICD-10-CM | POA: Diagnosis not present

## 2019-06-28 ENCOUNTER — Other Ambulatory Visit: Payer: Self-pay

## 2019-06-28 ENCOUNTER — Encounter (HOSPITAL_BASED_OUTPATIENT_CLINIC_OR_DEPARTMENT_OTHER): Payer: Self-pay | Admitting: Surgery

## 2019-07-03 ENCOUNTER — Encounter (HOSPITAL_BASED_OUTPATIENT_CLINIC_OR_DEPARTMENT_OTHER)
Admission: RE | Admit: 2019-07-03 | Discharge: 2019-07-03 | Disposition: A | Discharge status: Home / Self Care | Payer: PPO | Source: Ambulatory Visit | Attending: Surgery | Admitting: Surgery

## 2019-07-03 ENCOUNTER — Other Ambulatory Visit (HOSPITAL_COMMUNITY)
Admission: RE | Admit: 2019-07-03 | Discharge: 2019-07-03 | Disposition: A | Discharge status: Home / Self Care | Payer: PPO | Source: Ambulatory Visit | Attending: Surgery | Admitting: Surgery

## 2019-07-03 DIAGNOSIS — Z01812 Encounter for preprocedural laboratory examination: Secondary | ICD-10-CM | POA: Insufficient documentation

## 2019-07-03 DIAGNOSIS — Z20822 Contact with and (suspected) exposure to covid-19: Secondary | ICD-10-CM | POA: Diagnosis not present

## 2019-07-03 LAB — CBC WITH DIFFERENTIAL/PLATELET
Abs Immature Granulocytes: 0.08 10*3/uL — ABNORMAL HIGH (ref 0.00–0.07)
Basophils Absolute: 0.2 10*3/uL — ABNORMAL HIGH (ref 0.0–0.1)
Basophils Relative: 1 %
Eosinophils Absolute: 0.8 10*3/uL — ABNORMAL HIGH (ref 0.0–0.5)
Eosinophils Relative: 8 %
HCT: 47.1 % — ABNORMAL HIGH (ref 36.0–46.0)
Hemoglobin: 15.3 g/dL — ABNORMAL HIGH (ref 12.0–15.0)
Immature Granulocytes: 1 %
Lymphocytes Relative: 18 %
Lymphs Abs: 2 10*3/uL (ref 0.7–4.0)
MCH: 29.1 pg (ref 26.0–34.0)
MCHC: 32.5 g/dL (ref 30.0–36.0)
MCV: 89.5 fL (ref 80.0–100.0)
Monocytes Absolute: 1.4 10*3/uL — ABNORMAL HIGH (ref 0.1–1.0)
Monocytes Relative: 12 %
Neutro Abs: 6.7 10*3/uL (ref 1.7–7.7)
Neutrophils Relative %: 60 %
Platelets: 297 10*3/uL (ref 150–400)
RBC: 5.26 MIL/uL — ABNORMAL HIGH (ref 3.87–5.11)
RDW: 16.3 % — ABNORMAL HIGH (ref 11.5–15.5)
WBC: 11.1 10*3/uL — ABNORMAL HIGH (ref 4.0–10.5)
nRBC: 0 % (ref 0.0–0.2)

## 2019-07-03 LAB — COMPREHENSIVE METABOLIC PANEL
ALT: 18 U/L (ref 0–44)
AST: 21 U/L (ref 15–41)
Albumin: 4.2 g/dL (ref 3.5–5.0)
Alkaline Phosphatase: 90 U/L (ref 38–126)
Anion gap: 10 (ref 5–15)
BUN: 9 mg/dL (ref 8–23)
CO2: 26 mmol/L (ref 22–32)
Calcium: 9.4 mg/dL (ref 8.9–10.3)
Chloride: 99 mmol/L (ref 98–111)
Creatinine, Ser: 0.64 mg/dL (ref 0.44–1.00)
GFR calc Af Amer: >60 mL/min (ref >60)
GFR calc non Af Amer: >60 mL/min (ref >60)
Glucose, Bld: 99 mg/dL (ref 70–99)
Potassium: 4.2 mmol/L (ref 3.5–5.1)
Sodium: 135 mmol/L (ref 135–145)
Total Bilirubin: 1.3 mg/dL — ABNORMAL HIGH (ref 0.3–1.2)
Total Protein: 6.6 g/dL (ref 6.5–8.1)

## 2019-07-03 LAB — SARS CORONAVIRUS 2 (TAT 6-24 HRS): SARS Coronavirus 2: NEGATIVE

## 2019-07-03 NOTE — Progress Notes (Signed)

## 2019-07-05 ENCOUNTER — Ambulatory Visit
Admission: RE | Admit: 2019-07-05 | Discharge: 2019-07-05 | Disposition: A | Discharge status: Home / Self Care | Payer: PPO | Source: Ambulatory Visit | Attending: Surgery | Admitting: Surgery

## 2019-07-05 ENCOUNTER — Other Ambulatory Visit: Payer: Self-pay

## 2019-07-05 DIAGNOSIS — N6091 Unspecified benign mammary dysplasia of right breast: Secondary | ICD-10-CM | POA: Diagnosis not present

## 2019-07-06 ENCOUNTER — Ambulatory Visit (HOSPITAL_BASED_OUTPATIENT_CLINIC_OR_DEPARTMENT_OTHER): Payer: PPO | Admitting: Certified Registered"

## 2019-07-06 ENCOUNTER — Encounter (HOSPITAL_BASED_OUTPATIENT_CLINIC_OR_DEPARTMENT_OTHER): Payer: Self-pay | Admitting: Surgery

## 2019-07-06 ENCOUNTER — Encounter (HOSPITAL_BASED_OUTPATIENT_CLINIC_OR_DEPARTMENT_OTHER)
Admission: RE | Disposition: A | Discharge status: Home / Self Care | Payer: Self-pay | Source: Home / Self Care | Attending: Surgery

## 2019-07-06 ENCOUNTER — Ambulatory Visit
Admission: RE | Admit: 2019-07-06 | Discharge: 2019-07-06 | Disposition: A | Discharge status: Home / Self Care | Payer: PPO | Source: Ambulatory Visit | Attending: Surgery | Admitting: Surgery

## 2019-07-06 ENCOUNTER — Other Ambulatory Visit: Payer: Self-pay

## 2019-07-06 ENCOUNTER — Ambulatory Visit (HOSPITAL_BASED_OUTPATIENT_CLINIC_OR_DEPARTMENT_OTHER)
Admission: RE | Admit: 2019-07-06 | Discharge: 2019-07-06 | Disposition: A | Discharge status: Home / Self Care | Payer: PPO | Attending: Surgery | Admitting: Surgery

## 2019-07-06 DIAGNOSIS — E669 Obesity, unspecified: Secondary | ICD-10-CM | POA: Insufficient documentation

## 2019-07-06 DIAGNOSIS — F419 Anxiety disorder, unspecified: Secondary | ICD-10-CM | POA: Diagnosis not present

## 2019-07-06 DIAGNOSIS — N6091 Unspecified benign mammary dysplasia of right breast: Secondary | ICD-10-CM

## 2019-07-06 DIAGNOSIS — I10 Essential (primary) hypertension: Secondary | ICD-10-CM | POA: Insufficient documentation

## 2019-07-06 DIAGNOSIS — F329 Major depressive disorder, single episode, unspecified: Secondary | ICD-10-CM | POA: Insufficient documentation

## 2019-07-06 DIAGNOSIS — Z7952 Long term (current) use of systemic steroids: Secondary | ICD-10-CM | POA: Insufficient documentation

## 2019-07-06 DIAGNOSIS — N6011 Diffuse cystic mastopathy of right breast: Secondary | ICD-10-CM | POA: Diagnosis not present

## 2019-07-06 DIAGNOSIS — E7800 Pure hypercholesterolemia, unspecified: Secondary | ICD-10-CM | POA: Insufficient documentation

## 2019-07-06 DIAGNOSIS — Z6832 Body mass index (BMI) 32.0-32.9, adult: Secondary | ICD-10-CM | POA: Insufficient documentation

## 2019-07-06 DIAGNOSIS — R9431 Abnormal electrocardiogram [ECG] [EKG]: Secondary | ICD-10-CM | POA: Diagnosis not present

## 2019-07-06 DIAGNOSIS — N6310 Unspecified lump in the right breast, unspecified quadrant: Secondary | ICD-10-CM

## 2019-07-06 DIAGNOSIS — K219 Gastro-esophageal reflux disease without esophagitis: Secondary | ICD-10-CM | POA: Insufficient documentation

## 2019-07-06 DIAGNOSIS — J449 Chronic obstructive pulmonary disease, unspecified: Secondary | ICD-10-CM | POA: Insufficient documentation

## 2019-07-06 DIAGNOSIS — Z79899 Other long term (current) drug therapy: Secondary | ICD-10-CM | POA: Insufficient documentation

## 2019-07-06 HISTORY — PX: BREAST LUMPECTOMY WITH RADIOACTIVE SEED LOCALIZATION: SHX6424

## 2019-07-06 HISTORY — DX: Unspecified asthma, uncomplicated: J45.909

## 2019-07-06 HISTORY — DX: Chronic obstructive pulmonary disease, unspecified: J44.9

## 2019-07-06 SURGERY — BREAST LUMPECTOMY WITH RADIOACTIVE SEED LOCALIZATION
Anesthesia: General | Site: Breast | Laterality: Right

## 2019-07-06 MED ORDER — CEFAZOLIN SODIUM-DEXTROSE 2-4 GM/100ML-% IV SOLN
INTRAVENOUS | Status: AC
  Filled 2019-07-06: qty 100

## 2019-07-06 MED ORDER — CHLORHEXIDINE GLUCONATE CLOTH 2 % EX PADS: 6.0000 | MEDICATED_PAD | Freq: Once | CUTANEOUS | Status: DC

## 2019-07-06 MED ORDER — DEXAMETHASONE SODIUM PHOSPHATE 4 MG/ML IJ SOLN
INTRAMUSCULAR | Status: DC | PRN
  Administered 2019-07-06: 10 mg via INTRAVENOUS

## 2019-07-06 MED ORDER — FENTANYL CITRATE (PF) 100 MCG/2ML IJ SOLN
INTRAMUSCULAR | Status: DC | PRN
  Administered 2019-07-06: 100 ug via INTRAVENOUS

## 2019-07-06 MED ORDER — ACETAMINOPHEN 500 MG PO TABS
1000.0000 mg | ORAL_TABLET | ORAL | Status: AC
  Administered 2019-07-06: 1000 mg via ORAL

## 2019-07-06 MED ORDER — FENTANYL CITRATE (PF) 100 MCG/2ML IJ SOLN
INTRAMUSCULAR | Status: AC
  Filled 2019-07-06: qty 2

## 2019-07-06 MED ORDER — HYDROMORPHONE HCL 1 MG/ML IJ SOLN: 0.2500 mg | INTRAMUSCULAR | Status: DC | PRN

## 2019-07-06 MED ORDER — ONDANSETRON HCL 4 MG/2ML IJ SOLN: 4.0000 mg | Freq: Once | INTRAMUSCULAR | Status: DC | PRN

## 2019-07-06 MED ORDER — GABAPENTIN 300 MG PO CAPS
ORAL_CAPSULE | ORAL | Status: AC
  Filled 2019-07-06: qty 1

## 2019-07-06 MED ORDER — BUPIVACAINE HCL (PF) 0.25 % IJ SOLN
INTRAMUSCULAR | Status: DC | PRN
  Administered 2019-07-06: 20 mL

## 2019-07-06 MED ORDER — ONDANSETRON HCL 4 MG/2ML IJ SOLN
INTRAMUSCULAR | Status: DC | PRN
  Administered 2019-07-06: 4 mg via INTRAVENOUS

## 2019-07-06 MED ORDER — MEPERIDINE HCL 25 MG/ML IJ SOLN: 6.2500 mg | INTRAMUSCULAR | Status: DC | PRN

## 2019-07-06 MED ORDER — ONDANSETRON HCL 4 MG/2ML IJ SOLN
INTRAMUSCULAR | Status: AC
  Filled 2019-07-06: qty 2

## 2019-07-06 MED ORDER — LIDOCAINE 2% (20 MG/ML) 5 ML SYRINGE
INTRAMUSCULAR | Status: AC
  Filled 2019-07-06: qty 5

## 2019-07-06 MED ORDER — OXYCODONE HCL 5 MG PO TABS
5.0000 mg | ORAL_TABLET | Freq: Once | ORAL | Status: AC
  Administered 2019-07-06: 5 mg via ORAL

## 2019-07-06 MED ORDER — CELECOXIB 200 MG PO CAPS
ORAL_CAPSULE | ORAL | Status: AC
  Filled 2019-07-06: qty 1

## 2019-07-06 MED ORDER — LACTATED RINGERS IV SOLN: INTRAVENOUS | Status: DC

## 2019-07-06 MED ORDER — DEXAMETHASONE SODIUM PHOSPHATE 10 MG/ML IJ SOLN
INTRAMUSCULAR | Status: AC
  Filled 2019-07-06: qty 1

## 2019-07-06 MED ORDER — CEFAZOLIN SODIUM-DEXTROSE 2-4 GM/100ML-% IV SOLN
2.0000 g | INTRAVENOUS | Status: AC
  Administered 2019-07-06: 2 g via INTRAVENOUS

## 2019-07-06 MED ORDER — GABAPENTIN 300 MG PO CAPS: 300.0000 mg | ORAL_CAPSULE | ORAL | Status: DC

## 2019-07-06 MED ORDER — CELECOXIB 200 MG PO CAPS
200.0000 mg | ORAL_CAPSULE | ORAL | Status: AC
  Administered 2019-07-06: 200 mg via ORAL

## 2019-07-06 MED ORDER — HYDROCODONE-ACETAMINOPHEN 5-325 MG PO TABS: 1.0000 | ORAL_TABLET | Freq: Four times a day (QID) | ORAL | 0 refills | Status: DC | PRN

## 2019-07-06 MED ORDER — PROPOFOL 10 MG/ML IV BOLUS
INTRAVENOUS | Status: DC | PRN
  Administered 2019-07-06: 150 mg via INTRAVENOUS
  Administered 2019-07-06: 30 mg via INTRAVENOUS

## 2019-07-06 MED ORDER — ACETAMINOPHEN 500 MG PO TABS
ORAL_TABLET | ORAL | Status: AC
  Filled 2019-07-06: qty 2

## 2019-07-06 MED ORDER — LIDOCAINE 2% (20 MG/ML) 5 ML SYRINGE
INTRAMUSCULAR | Status: DC | PRN
  Administered 2019-07-06: 100 mg via INTRAVENOUS

## 2019-07-06 MED ORDER — OXYCODONE HCL 5 MG PO TABS
ORAL_TABLET | ORAL | Status: AC
  Filled 2019-07-06: qty 1

## 2019-07-06 MED ORDER — PROPOFOL 10 MG/ML IV BOLUS
INTRAVENOUS | Status: AC
  Filled 2019-07-06: qty 20

## 2019-07-06 SURGICAL SUPPLY — 51 items
ADH SKN CLS APL DERMABOND .7 (GAUZE/BANDAGES/DRESSINGS) ×1
APL PRP STRL LF DISP 70% ISPRP (MISCELLANEOUS) ×1
APPLIER CLIP 9.375 MED OPEN (MISCELLANEOUS)
APR CLP MED 9.3 20 MLT OPN (MISCELLANEOUS)
BINDER BREAST LRG (GAUZE/BANDAGES/DRESSINGS) ×2 IMPLANT
BINDER BREAST MEDIUM (GAUZE/BANDAGES/DRESSINGS) IMPLANT
BINDER BREAST XLRG (GAUZE/BANDAGES/DRESSINGS) IMPLANT
BINDER BREAST XXLRG (GAUZE/BANDAGES/DRESSINGS) IMPLANT
BLADE SURG 15 STRL LF DISP TIS (BLADE) ×1 IMPLANT
BLADE SURG 15 STRL SS (BLADE) ×3
CANISTER SUC SOCK COL 7IN (MISCELLANEOUS) IMPLANT
CANISTER SUCT 1200ML W/VALVE (MISCELLANEOUS) IMPLANT
CHLORAPREP W/TINT 26 (MISCELLANEOUS) ×3 IMPLANT
CLIP APPLIE 9.375 MED OPEN (MISCELLANEOUS) IMPLANT
COVER BACK TABLE 60X90IN (DRAPES) ×3 IMPLANT
COVER MAYO STAND STRL (DRAPES) ×3 IMPLANT
COVER PROBE W GEL 5X96 (DRAPES) ×3 IMPLANT
COVER WAND RF STERILE (DRAPES) IMPLANT
DECANTER SPIKE VIAL GLASS SM (MISCELLANEOUS) IMPLANT
DERMABOND ADVANCED (GAUZE/BANDAGES/DRESSINGS) ×2
DERMABOND ADVANCED .7 DNX12 (GAUZE/BANDAGES/DRESSINGS) ×1 IMPLANT
DRAPE LAPAROSCOPIC ABDOMINAL (DRAPES) IMPLANT
DRAPE LAPAROTOMY 100X72 PEDS (DRAPES) ×3 IMPLANT
DRAPE UTILITY XL STRL (DRAPES) ×3 IMPLANT
ELECT COATED BLADE 2.86 ST (ELECTRODE) ×3 IMPLANT
ELECT REM PT RETURN 9FT ADLT (ELECTROSURGICAL) ×3
ELECTRODE REM PT RTRN 9FT ADLT (ELECTROSURGICAL) ×1 IMPLANT
GLOVE BIOGEL PI IND STRL 8 (GLOVE) ×1 IMPLANT
GLOVE BIOGEL PI INDICATOR 8 (GLOVE) ×2
GLOVE ECLIPSE 8.0 STRL XLNG CF (GLOVE) ×3 IMPLANT
GOWN STRL REUS W/ TWL LRG LVL3 (GOWN DISPOSABLE) ×2 IMPLANT
GOWN STRL REUS W/TWL LRG LVL3 (GOWN DISPOSABLE) ×3
HEMOSTAT ARISTA ABSORB 3G PWDR (HEMOSTASIS) IMPLANT
HEMOSTAT SNOW SURGICEL 2X4 (HEMOSTASIS) IMPLANT
KIT MARKER MARGIN INK (KITS) ×3 IMPLANT
NDL HYPO 25X1 1.5 SAFETY (NEEDLE) ×1 IMPLANT
NEEDLE HYPO 25X1 1.5 SAFETY (NEEDLE) ×3 IMPLANT
NS IRRIG 1000ML POUR BTL (IV SOLUTION) ×1 IMPLANT
PENCIL SMOKE EVACUATOR (MISCELLANEOUS) ×3 IMPLANT
SET BASIN DAY SURGERY F.S. (CUSTOM PROCEDURE TRAY) ×3 IMPLANT
SLEEVE SCD COMPRESS KNEE MED (MISCELLANEOUS) ×3 IMPLANT
SPONGE LAP 4X18 RFD (DISPOSABLE) ×3 IMPLANT
SUT MNCRL AB 4-0 PS2 18 (SUTURE) ×3 IMPLANT
SUT SILK 2 0 SH (SUTURE) IMPLANT
SUT VICRYL 3-0 CR8 SH (SUTURE) ×3 IMPLANT
SYR CONTROL 10ML LL (SYRINGE) ×3 IMPLANT
TOWEL GREEN STERILE FF (TOWEL DISPOSABLE) ×3 IMPLANT
TRAY FAXITRON CT DISP (TRAY / TRAY PROCEDURE) ×3 IMPLANT
TUBE CONNECTING 20'X1/4 (TUBING)
TUBE CONNECTING 20X1/4 (TUBING) IMPLANT
YANKAUER SUCT BULB TIP NO VENT (SUCTIONS) IMPLANT

## 2019-07-06 NOTE — Anesthesia Procedure Notes (Signed)
Procedure Name: LMA Insertion Date/Time: 07/06/2019 9:46 AM Performed by: Marrianne Mood, CRNA Pre-anesthesia Checklist: Patient identified, Emergency Drugs available, Suction available, Patient being monitored and Timeout performed Patient Re-evaluated:Patient Re-evaluated prior to induction Oxygen Delivery Method: Circle system utilized Preoxygenation: Pre-oxygenation with 100% oxygen Induction Type: IV induction Ventilation: Mask ventilation without difficulty LMA: LMA inserted LMA Size: 4.0 Number of attempts: 1 Airway Equipment and Method: Bite block Placement Confirmation: positive ETCO2 Tube secured with: Tape Dental Injury: Teeth and Oropharynx as per pre-operative assessment

## 2019-07-06 NOTE — Discharge Instructions (Signed)
[place] Office Phone Number [phone]  BREAST BIOPSY/ PARTIAL MASTECTOMY: POST OP INSTRUCTIONS  Always review your discharge instruction sheet given to you by the facility where your surgery was performed.  IF YOU HAVE DISABILITY OR FAMILY LEAVE FORMS, YOU MUST BRING THEM TO THE OFFICE FOR PROCESSING.  DO NOT GIVE THEM TO YOUR DOCTOR.  1. A prescription for pain medication may be given to you upon discharge.  Take your pain medication as prescribed, if needed.  If narcotic pain medicine is not needed, then you may take acetaminophen (Tylenol) or ibuprofen (Advil) as needed. 2. Take your usually prescribed medications unless otherwise directed 3. If you need a refill on your pain medication, please contact your pharmacy.  They will contact our office to request authorization.  Prescriptions will not be filled after 5pm or on week-ends. 4. You should eat very light the first 24 hours after surgery, such as soup, crackers, pudding, etc.  Resume your normal diet the day after surgery. 5. Most patients will experience some swelling and bruising in the breast.  Ice packs and a good support bra will help.  Swelling and bruising can take several days to resolve.  6. It is common to experience some constipation if taking pain medication after surgery.  Increasing fluid intake and taking a stool softener will usually help or prevent this problem from occurring.  A mild laxative (Milk of Magnesia or Miralax) should be taken according to package directions if there are no bowel movements after 48 hours. 7. Unless discharge instructions indicate otherwise, you may remove your bandages 24-48 hours after surgery, and you may shower at that time.  You may have steri-strips (small skin tapes) in place directly over the incision.  These strips should be left on the skin for 7-10 days.  If your surgeon used skin glue on the incision, you may shower in 24 hours.  The glue will flake off over the  next 2-3 weeks.  Any sutures or staples will be removed at the office during your follow-up visit. 8. ACTIVITIES:  You may resume regular daily activities (gradually increasing) beginning the next day.  Wearing a good support bra or sports bra minimizes pain and swelling.  You may have sexual intercourse when it is comfortable. a. You may drive when you no longer are taking prescription pain medication, you can comfortably wear a seatbelt, and you can safely maneuver your car and apply brakes. b. RETURN TO WORK:  ______________________________________________________________________________________ 9. You should see your doctor in the office for a follow-up appointment approximately two weeks after your surgery.  Your doctor's nurse will typically make your follow-up appointment when she calls you with your pathology report.  Expect your pathology report 2-3 business days after your surgery.  You may call to check if you do not hear from [place] after three days. 10. OTHER INSTRUCTIONS: _______________________________________________________________________________________________ _____________________________________________________________________________________________________________________________________ _____________________________________________________________________________________________________________________________________ _____________________________________________________________________________________________________________________________________  WHEN TO CALL YOUR DOCTOR: 1. Fever over 101.0 2. Nausea and/or vomiting. 3. Extreme swelling or bruising. 4. Continued bleeding from incision. 5. Increased pain, redness, or drainage from the incision.  The clinic staff is available to answer your questions during regular business hours.  Please don't hesitate to call and ask to speak to one of the nurses for clinical concerns.  If you have a medical emergency, go to the nearest  emergency room or call 911.  A surgeon from [facility] is always on call at the hospital.  For further questions, please visit [email or web address]  NO TYLENOL PRODUCTS UNTIL 2:15 PM  NO ADVIL/ MOTRIN UNTIL 4:15 PM   OXYCODONE 5 mg given at 10:55. No Narcotic pain medicine until 5 :00     Post Anesthesia Home Care Instructions  Activity: Get plenty of rest for the remainder of the day. A responsible individual must stay with you for 24 hours following the procedure.  For the next 24 hours, DO NOT: -Drive a car -Paediatric nurse -Drink alcoholic beverages -Take any medication unless instructed by your physician -Make any legal decisions or sign important papers.  Meals: Start with liquid foods such as gelatin or soup. Progress to regular foods as tolerated. Avoid greasy, spicy, heavy foods. If nausea and/or vomiting occur, drink only clear liquids until the nausea and/or vomiting subsides. Call your physician if vomiting continues.  Special Instructions/Symptoms: Your throat may feel dry or sore from the anesthesia or the breathing tube placed in your throat during surgery. If this causes discomfort, gargle with warm salt water. The discomfort should disappear within 24 hours.  If you had a scopolamine patch placed behind your ear for the management of post- operative nausea and/or vomiting:  1. The medication in the patch is effective for 72 hours, after which it should be removed.  Wrap patch in a tissue and discard in the trash. Wash hands thoroughly with soap and water. 2. You may remove the patch earlier than 72 hours if you experience unpleasant side effects which may include dry mouth, dizziness or visual disturbances. 3. Avoid touching the patch. Wash your hands with soap and water after contact with the patch.

## 2019-07-06 NOTE — Op Note (Signed)
Preoperative diagnosis:: Right breast atypical ductal hyperplasia  Postop diagnosis: Same  Procedure:      Right     breast seed localized lumpectomy  Surgeon: Erroll Luna, MD  Anesthesia: LMA with local  EBL: Minimal  Specimen: Right breast tissue with seed and clip verified by Faxitron  Drains: None  IV fluids: Per anesthesia record  Indications for procedure: Patient presents for lumpectomy for right breast atypical ductal hyperplasia breast lesion.  Core biopsy showed atypical ductal hyperplasia.  She was deemed high risk as well..  The procedure has been discussed with the patient. Alternatives to surgery have been discussed with the patient.  Risks of surgery include bleeding,  Infection,  Seroma formation, death,  and the need for further surgery.   Neoprobe used to identify seed in breast.  The patient understands and wishes to proceed.     Description of procedure: Patient was met in the holding area and questions were answered.  Neoprobe used to verify seed location right breast.  Procedure reviewed all questions answered again.  Complications reviewed.  Patient then taken back to operating.  She was placed supine upon the OR table.  After induction of LMA anesthesia, right breast was prepped and draped in sterile fashion and timeout performed.  Proper patient, site and procedure verified.  Neoprobe identified seed in the breast.  Curvilinear incision made along the superior border of the nipple areolar complex and dissection carried down into the subcutaneous tissues.  All tissue around the seed and clip were excised with a grossly negative margin.  Hemostasis achieved with cautery.  Faxitron revealed seed and clip to be in specimen.  This was oriented with ink and sent to pathology.  Wound irrigated and local anesthetic infiltrated.  Hemostasis achieved wound closed with 3-0 Vicryl for Monocryl.  Dermabond applied.  All counts were found to be correct.  Breast binder placed.   The patient was awoke extubated taken to recovery in satisfactory condition.

## 2019-07-06 NOTE — Interval H&P Note (Signed)
History and Physical Interval Note:  07/06/2019 9:17 AM  Tasha Hunt  has presented today for surgery, with the diagnosis of ATYPICAL DUCTAL HYPERPLASIA RIGHT BREAST.  The various methods of treatment have been discussed with the patient and family. After consideration of risks, benefits and other options for treatment, the patient has consented to  Procedure(s): RIGHT BREAST LUMPECTOMY WITH RADIOACTIVE SEED LOCALIZATION (Right) as a surgical intervention.  The patient's history has been reviewed, patient examined, no change in status, stable for surgery.  I have reviewed the patient's chart and labs.  Questions were answered to the patient's satisfaction.     [place]

## 2019-07-06 NOTE — Anesthesia Preprocedure Evaluation (Signed)
Anesthesia Evaluation  Patient identified by MRN, date of birth, ID band Patient awake    Reviewed: Allergy & Precautions, NPO status , Patient's Chart, lab work & pertinent test results  History of Anesthesia Complications (+) PONV  Airway Mallampati: I  TM Distance: >3 FB Neck ROM: Full    Dental   Pulmonary COPD,    Pulmonary exam normal        Cardiovascular hypertension, Pt. on medications Normal cardiovascular exam     Neuro/Psych Anxiety Depression    GI/Hepatic GERD  Medicated and Controlled,  Endo/Other    Renal/GU      Musculoskeletal   Abdominal   Peds  Hematology   Anesthesia Other Findings   Reproductive/Obstetrics                             Anesthesia Physical Anesthesia Plan  ASA: III  Anesthesia Plan: General   Post-op Pain Management:    Induction: Intravenous  PONV Risk Score and Plan: 4 or greater and Ondansetron, Midazolam and Treatment may vary due to age or medical condition  Airway Management Planned: LMA  Additional Equipment:   Intra-op Plan:   Post-operative Plan: Extubation in OR  Informed Consent: I have reviewed the patients History and Physical, chart, labs and discussed the procedure including the risks, benefits and alternatives for the proposed anesthesia with the patient or authorized representative who has indicated his/her understanding and acceptance.       Plan Discussed with: CRNA and Surgeon  Anesthesia Plan Comments:         Anesthesia Quick Evaluation

## 2019-07-06 NOTE — Transfer of Care (Signed)
Immediate Anesthesia Transfer of Care Note  Patient: Tasha Hunt  Procedure(s) Performed: RIGHT BREAST LUMPECTOMY WITH RADIOACTIVE SEED LOCALIZATION (Right Breast)  Patient Location: PACU  Anesthesia Type:General  Level of Consciousness: awake and patient cooperative  Airway & Oxygen Therapy: Patient Spontanous Breathing and Patient connected to face mask oxygen  Post-op Assessment: Report given to RN and Post -op Vital signs reviewed and stable  Post vital signs: Reviewed and stable  Last Vitals:  Vitals Value Taken Time  BP    Temp    Pulse 90 07/06/19 1029  Resp 22 07/06/19 1029  SpO2 96 % 07/06/19 1029  Vitals shown include unvalidated device data.  Last Pain:  Vitals:   07/06/19 0808  TempSrc: Oral  PainSc: 0-No pain      Patients Stated Pain Goal: 3 (03/70/48 8891)  Complications: No complications documented.

## 2019-07-06 NOTE — Anesthesia Postprocedure Evaluation (Signed)
Anesthesia Post Note  Patient: Tasha Hunt  Procedure(s) Performed: RIGHT BREAST LUMPECTOMY WITH RADIOACTIVE SEED LOCALIZATION (Right Breast)     Patient location during evaluation: PACU Anesthesia Type: General Level of consciousness: awake and alert Pain management: pain level controlled Vital Signs Assessment: post-procedure vital signs reviewed and stable Respiratory status: spontaneous breathing, nonlabored ventilation, respiratory function stable and patient connected to nasal cannula oxygen Cardiovascular status: blood pressure returned to baseline and stable Postop Assessment: no apparent nausea or vomiting Anesthetic complications: no   No complications documented.  Last Vitals:  Vitals:   07/06/19 1100 07/06/19 1130  BP: 129/64 131/69  Pulse: 70 77  Resp: 16 16  Temp:  36.5 C  SpO2: 98% 93%    Last Pain:  Vitals:   07/06/19 1130  TempSrc:   PainSc: 3                  Garrie Elenes DAVID

## 2019-07-07 ENCOUNTER — Encounter (HOSPITAL_BASED_OUTPATIENT_CLINIC_OR_DEPARTMENT_OTHER): Payer: Self-pay | Admitting: Surgery

## 2019-07-07 LAB — SURGICAL PATHOLOGY

## 2019-07-07 NOTE — Addendum Note (Signed)
Addendum  created 07/07/19 1252 by Tawni Millers, CRNA   Charge Capture section accepted

## 2019-07-11 DIAGNOSIS — M19011 Primary osteoarthritis, right shoulder: Secondary | ICD-10-CM | POA: Diagnosis not present

## 2019-07-11 DIAGNOSIS — M25511 Pain in right shoulder: Secondary | ICD-10-CM | POA: Diagnosis not present

## 2019-08-08 DIAGNOSIS — M19011 Primary osteoarthritis, right shoulder: Secondary | ICD-10-CM | POA: Diagnosis not present

## 2019-08-08 DIAGNOSIS — M25511 Pain in right shoulder: Secondary | ICD-10-CM | POA: Diagnosis not present

## 2019-08-08 DIAGNOSIS — M75101 Unspecified rotator cuff tear or rupture of right shoulder, not specified as traumatic: Secondary | ICD-10-CM | POA: Diagnosis not present

## 2019-08-09 DIAGNOSIS — D1801 Hemangioma of skin and subcutaneous tissue: Secondary | ICD-10-CM | POA: Diagnosis not present

## 2019-08-09 DIAGNOSIS — L814 Other melanin hyperpigmentation: Secondary | ICD-10-CM | POA: Diagnosis not present

## 2019-08-09 DIAGNOSIS — X32XXXS Exposure to sunlight, sequela: Secondary | ICD-10-CM | POA: Diagnosis not present

## 2019-08-09 DIAGNOSIS — L308 Other specified dermatitis: Secondary | ICD-10-CM | POA: Diagnosis not present

## 2019-08-09 DIAGNOSIS — L821 Other seborrheic keratosis: Secondary | ICD-10-CM | POA: Diagnosis not present

## 2019-08-16 DIAGNOSIS — Z79899 Other long term (current) drug therapy: Secondary | ICD-10-CM | POA: Diagnosis not present

## 2019-08-16 DIAGNOSIS — M12811 Other specific arthropathies, not elsewhere classified, right shoulder: Secondary | ICD-10-CM | POA: Diagnosis not present

## 2019-08-16 DIAGNOSIS — R7309 Other abnormal glucose: Secondary | ICD-10-CM | POA: Diagnosis not present

## 2019-08-16 DIAGNOSIS — Z7689 Persons encountering health services in other specified circumstances: Secondary | ICD-10-CM | POA: Diagnosis not present

## 2019-08-16 DIAGNOSIS — I10 Essential (primary) hypertension: Secondary | ICD-10-CM | POA: Diagnosis not present

## 2019-08-16 DIAGNOSIS — J449 Chronic obstructive pulmonary disease, unspecified: Secondary | ICD-10-CM | POA: Diagnosis not present

## 2019-10-04 ENCOUNTER — Encounter (INDEPENDENT_AMBULATORY_CARE_PROVIDER_SITE_OTHER): Payer: PPO | Admitting: Ophthalmology

## 2019-10-05 ENCOUNTER — Other Ambulatory Visit: Payer: Self-pay

## 2019-10-05 ENCOUNTER — Encounter (INDEPENDENT_AMBULATORY_CARE_PROVIDER_SITE_OTHER): Payer: PPO | Admitting: Ophthalmology

## 2019-10-05 DIAGNOSIS — I10 Essential (primary) hypertension: Secondary | ICD-10-CM | POA: Diagnosis not present

## 2019-10-05 DIAGNOSIS — D3131 Benign neoplasm of right choroid: Secondary | ICD-10-CM

## 2019-10-05 DIAGNOSIS — H43813 Vitreous degeneration, bilateral: Secondary | ICD-10-CM | POA: Diagnosis not present

## 2019-10-05 DIAGNOSIS — H35033 Hypertensive retinopathy, bilateral: Secondary | ICD-10-CM | POA: Diagnosis not present

## 2019-10-25 DIAGNOSIS — Z86010 Personal history of colonic polyps: Secondary | ICD-10-CM | POA: Diagnosis not present

## 2019-10-25 DIAGNOSIS — K5730 Diverticulosis of large intestine without perforation or abscess without bleeding: Secondary | ICD-10-CM | POA: Diagnosis not present

## 2019-10-25 DIAGNOSIS — K625 Hemorrhage of anus and rectum: Secondary | ICD-10-CM | POA: Diagnosis not present

## 2019-10-27 ENCOUNTER — Ambulatory Visit: Admit: 2019-10-27 | Payer: PPO | Admitting: Orthopedic Surgery

## 2019-10-27 DIAGNOSIS — J984 Other disorders of lung: Secondary | ICD-10-CM | POA: Diagnosis not present

## 2019-10-27 SURGERY — ARTHROPLASTY, SHOULDER, TOTAL, REVERSE
Anesthesia: General | Site: Shoulder | Laterality: Right

## 2019-11-06 DIAGNOSIS — B372 Candidiasis of skin and nail: Secondary | ICD-10-CM | POA: Diagnosis not present

## 2019-11-09 NOTE — Progress Notes (Signed)
Pt. Needs orders for upcomming surgery.PST and labs appointment on: 11/10/19.Thanks.

## 2019-11-09 NOTE — Patient Instructions (Addendum)
DUE TO COVID-19 ONLY ONE VISITOR IS ALLOWED TO COME WITH YOU AND STAY IN THE WAITING ROOM ONLY DURING PRE OP AND PROCEDURE DAY OF SURGERY. THE 1 VISITOR  MAY VISIT WITH YOU AFTER SURGERY IN YOUR PRIVATE ROOM DURING VISITING HOURS ONLY!  YOU NEED TO HAVE A COVID 19 TEST ON: 11/14/19 @ 1:00 PM , THIS TEST MUST BE DONE BEFORE SURGERY,  COVID TESTING SITE [place] [id number], IT IS ON THE RIGHT GOING OUT [address] APPROXIMATELY  2 MINUTES PAST [employer] ON THE RIGHT. ONCE YOUR COVID TEST IS COMPLETED,  PLEASE BEGIN THE QUARANTINE INSTRUCTIONS AS OUTLINED IN YOUR HANDOUT.                Wende Neighbors Deemer    Your procedure is scheduled on: 11/17/19   Report to [facility] Main  Entrance   Report to short stay at: 5:30 AM     Call this number if you have problems the morning of surgery [phone]    Remember: Do not eat food or drink liquids :After Midnight.   BRUSH YOUR TEETH MORNING OF SURGERY AND RINSE YOUR MOUTH OUT, NO CHEWING GUM CANDY OR MINTS.     Take these medicines the morning of surgery with A SIP OF WATER: cetirizine,cymbalta,dilt-xr,rabeprazoleUse inhalers and flonase as usual.Xanax as needed.                               You may not have any metal on your body including hair pins and              piercings  Do not wear jewelry, make-up, lotions, powders or perfumes, deodorant             Do not wear nail polish on your fingernails.  Do not shave  48 hours prior to surgery.    Do not bring valuables to the hospital. [place].  Contacts, dentures or bridgework may not be worn into surgery.  Leave suitcase in the car. After surgery it may be brought to your room.     Patients discharged the day of surgery will not be allowed to drive home. IF YOU ARE HAVING SURGERY AND GOING HOME THE SAME DAY, YOU MUST HAVE AN ADULT TO DRIVE YOU HOME AND BE WITH YOU FOR 24 HOURS. YOU MAY  GO HOME BY TAXI OR UBER OR ORTHERWISE, BUT AN ADULT MUST ACCOMPANY YOU HOME AND STAY WITH YOU FOR 24 HOURS.  Name and phone number of your driver:  Special Instructions: N/A              Please read over the following fact sheets you were given: _____________________________________________________________________          [facility] - Preparing for Surgery Before surgery, you can play an important role.  Because skin is not sterile, your skin needs to be as free of germs as possible.  You can reduce the number of germs on your skin by washing with CHG (chlorahexidine gluconate) soap before surgery.  CHG is an antiseptic cleaner which kills germs and bonds with the skin to continue killing germs even after washing. Please DO NOT use if you have an allergy to CHG or antibacterial soaps.  If your skin becomes reddened/irritated stop using the CHG and inform  your nurse when you arrive at Short Stay. Do not shave (including legs and underarms) for at least 48 hours prior to the first CHG shower.  You may shave your face/neck. Please follow these instructions carefully:  1.  Shower with CHG Soap the night before surgery and the  morning of Surgery.  2.  If you choose to wash your hair, wash your hair first as usual with your  normal  shampoo.  3.  After you shampoo, rinse your hair and body thoroughly to remove the  shampoo.                           4.  Use CHG as you would any other liquid soap.  You can apply chg directly  to the skin and wash                       Gently with a scrungie or clean washcloth.  5.  Apply the CHG Soap to your body ONLY FROM THE NECK DOWN.   Do not use on face/ open                           Wound or open sores. Avoid contact with eyes, ears mouth and genitals (private parts).                       Wash face,  Genitals (private parts) with your normal soap.             6.  Wash thoroughly, paying special attention to the area where your surgery  will be performed.  7.   Thoroughly rinse your body with warm water from the neck down.  8.  DO NOT shower/wash with your normal soap after using and rinsing off  the CHG Soap.                9.  Pat yourself dry with a clean towel.            10.  Wear clean pajamas.            11.  Place clean sheets on your bed the night of your first shower and do not  sleep with pets. Day of Surgery : Do not apply any lotions/deodorants the morning of surgery.  Please wear clean clothes to the hospital/surgery center.  FAILURE TO FOLLOW THESE INSTRUCTIONS MAY RESULT IN THE CANCELLATION OF YOUR SURGERY PATIENT SIGNATURE_________________________________  NURSE SIGNATURE__________________________________  ________________________________________________________________________ [facility]- Preparing for Total Shoulder Arthroplasty    Before surgery, you can play an important role. Because skin is not sterile, your skin needs to be as free of germs as possible. You can reduce the number of germs on your skin by using the following products. . Benzoyl Peroxide Gel o Reduces the number of germs present on the skin o Applied twice a day to shoulder area starting two days before surgery    ==================================================================  Please follow these instructions carefully:  BENZOYL PEROXIDE 5% GEL  Please do not use if you have an allergy to benzoyl peroxide.   If your skin becomes reddened/irritated stop using the benzoyl peroxide.  Starting two days before surgery, apply as follows: 1. Apply benzoyl peroxide in the morning and at night. Apply after taking a shower. If you are not taking a shower clean entire shoulder front, back, and side along with  the armpit with a clean wet washcloth.  2. Place a quarter-sized dollop on your shoulder and rub in thoroughly, making sure to cover the front, back, and side of your shoulder, along with the armpit.   2 days before ____ AM   ____ PM              1 day  before ____ AM   ____ PM                         3. Do this twice a day for two days.  (Last application is the night before surgery, AFTER using the CHG soap as described below).  4. Do NOT apply benzoyl peroxide gel on the day of surgery.

## 2019-11-10 ENCOUNTER — Encounter (HOSPITAL_COMMUNITY): Payer: Self-pay

## 2019-11-10 ENCOUNTER — Other Ambulatory Visit: Payer: Self-pay

## 2019-11-10 ENCOUNTER — Encounter (HOSPITAL_COMMUNITY)
Admission: RE | Admit: 2019-11-10 | Discharge: 2019-11-10 | Disposition: A | Discharge status: Home / Self Care | Payer: PPO | Source: Ambulatory Visit | Attending: Orthopedic Surgery | Admitting: Orthopedic Surgery

## 2019-11-10 DIAGNOSIS — Z01812 Encounter for preprocedural laboratory examination: Secondary | ICD-10-CM | POA: Insufficient documentation

## 2019-11-10 LAB — CBC
HCT: 46 % (ref 36.0–46.0)
Hemoglobin: 14.9 g/dL (ref 12.0–15.0)
MCH: 28 pg (ref 26.0–34.0)
MCHC: 32.4 g/dL (ref 30.0–36.0)
MCV: 86.5 fL (ref 80.0–100.0)
Platelets: 274 10*3/uL (ref 150–400)
RBC: 5.32 MIL/uL — ABNORMAL HIGH (ref 3.87–5.11)
RDW: 14.6 % (ref 11.5–15.5)
WBC: 10.3 10*3/uL (ref 4.0–10.5)
nRBC: 0 % (ref 0.0–0.2)

## 2019-11-10 LAB — BASIC METABOLIC PANEL
Anion gap: 11 (ref 5–15)
BUN: 9 mg/dL (ref 8–23)
CO2: 29 mmol/L (ref 22–32)
Calcium: 9.6 mg/dL (ref 8.9–10.3)
Chloride: 99 mmol/L (ref 98–111)
Creatinine, Ser: 0.57 mg/dL (ref 0.44–1.00)
GFR, Estimated: >60 mL/min (ref >60)
Glucose, Bld: 104 mg/dL — ABNORMAL HIGH (ref 70–99)
Potassium: 3.7 mmol/L (ref 3.5–5.1)
Sodium: 139 mmol/L (ref 135–145)

## 2019-11-10 LAB — SURGICAL PCR SCREEN
MRSA, PCR: NEGATIVE
Staphylococcus aureus: NEGATIVE

## 2019-11-10 NOTE — Progress Notes (Signed)
COVID Vaccine Completed: Yes Date COVID Vaccine completed: 03/07/19 COVID vaccine manufacturer: Pfizer      PCP - Dr. Velna Hatchet Cardiologist -   Chest x-ray -  EKG - 07/03/19 EPIC Stress Test -  ECHO -  Cardiac Cath -  Pacemaker/ICD device last checked:  Sleep Study -  CPAP -   Fasting Blood Sugar -  Checks Blood Sugar _____ times a day  Blood Thinner Instructions: Aspirin Instructions: Last Dose:  Anesthesia review:   Patient denies shortness of breath, fever, cough and chest pain at PAT appointment   Patient verbalized understanding of instructions that were given to them at the PAT appointment. Patient was also instructed that they will need to review over the PAT instructions again at home before surgery.

## 2019-11-14 ENCOUNTER — Other Ambulatory Visit (HOSPITAL_COMMUNITY)
Admission: RE | Admit: 2019-11-14 | Discharge: 2019-11-14 | Disposition: A | Discharge status: Home / Self Care | Payer: PPO | Source: Ambulatory Visit | Attending: Orthopedic Surgery | Admitting: Orthopedic Surgery

## 2019-11-14 DIAGNOSIS — Z01812 Encounter for preprocedural laboratory examination: Secondary | ICD-10-CM | POA: Diagnosis not present

## 2019-11-14 DIAGNOSIS — Z20822 Contact with and (suspected) exposure to covid-19: Secondary | ICD-10-CM | POA: Diagnosis not present

## 2019-11-14 LAB — SARS CORONAVIRUS 2 (TAT 6-24 HRS): SARS Coronavirus 2: NEGATIVE

## 2019-11-14 NOTE — H&P (Signed)
Patient's anticipated LOS is less than 2 midnights, meeting these requirements: - Younger than 55 - Lives within 1 hour of care - Has a competent adult at home to recover with post-op recover - NO history of  - Chronic pain requiring opiods  - Diabetes  - Coronary Artery Disease  - Heart failure  - Heart attack  - Stroke  - DVT/VTE  - Cardiac arrhythmia  - Respiratory Failure/COPD  - Renal failure  - Anemia  - Advanced Liver disease       Tasha Hunt is an 74 y.o. female.    Chief Complaint: right shoulder pain  HPI: Pt is a 74 y.o. female complaining of right shoulder pain for multiple years. Pain had continually increased since the beginning. X-rays in the clinic show end-stage arthritic changes of the right shoulder. Pt has tried various conservative treatments which have failed to alleviate their symptoms, including injections and therapy. Various options are discussed with the patient. Risks, benefits and expectations were discussed with the patient. Patient understand the risks, benefits and expectations and wishes to proceed with surgery.   PCP:  Velna Hatchet, MD  D/C Plans: Home  PMH: Past Medical History:  Diagnosis Date  . Allergic rhinitis, cause unspecified   . Aneurysm of splenic artery ([place])    noted since 2002  . Anxiety   . Arthritis    hands  . Asthma   . Complication of anesthesia   . COPD (chronic obstructive pulmonary disease) ([place])   . Depressive disorder, not elsewhere classified   . Esophageal reflux   . H/O hiatal hernia   . Hypertension   . Obesity, unspecified   . Pneumonia    03/27/11 "many many years ago"  . PONV (postoperative nausea and vomiting)     PSH: Past Surgical History:  Procedure Laterality Date  . ABDOMINAL HYSTERECTOMY  1980  . ARTERIAL ANEURYSM REPAIR  03/27/11   splenic artery  . BREAST BIOPSY  2003   right  . BREAST BIOPSY  2012   left  . BREAST LUMPECTOMY WITH RADIOACTIVE SEED LOCALIZATION Right  07/06/2019   Procedure: RIGHT BREAST LUMPECTOMY WITH RADIOACTIVE SEED LOCALIZATION;  Surgeon: Erroll Luna, MD;  Location: [place];  Service: General;  Laterality: Right;  . CHOLECYSTECTOMY  2002  . EMBOLIZATION N/A 03/27/2011   Procedure: EMBOLIZATION;  Surgeon: Elam Dutch, MD;  Location: [facility] CATH LAB;  Service: Cardiovascular;  Laterality: N/A;  . HERNIA REPAIR    . LAPAROSCOPIC NISSEN FUNDOPLICATION  6283  . NASAL SEPTUM SURGERY  1985   deviated septum  . TONSILLECTOMY  1948  . TOTAL KNEE ARTHROPLASTY Right 04/20/2016   Procedure: RIGHT TOTAL KNEE ARTHROPLASTY;  Surgeon: Paralee Cancel, MD;  Location: [facility];  Service: Orthopedics;  Laterality: Right;    Social History:  reports that she has never smoked. She has never used smokeless tobacco. She reports that she does not drink alcohol and does not use drugs.  Allergies:  Allergies  Allergen Reactions  . Morphine And Related Nausea And Vomiting    Medications: No current facility-administered medications for this encounter.   Current Outpatient Medications  Medication Sig Dispense Refill  . acetaminophen (TYLENOL) 325 MG tablet Take 650 mg by mouth every 6 (six) hours as needed for moderate pain or headache.    . albuterol (VENTOLIN HFA) 108 (90 Base) MCG/ACT inhaler Inhale 1 puff into the lungs every 6 (six) hours as needed for wheezing or shortness of breath.    Marland Kitchen  ALPRAZolam (XANAX) 0.25 MG tablet Take 0.25 mg by mouth 2 (two) times daily as needed for anxiety.     Marland Kitchen atorvastatin (LIPITOR) 10 MG tablet Take 10 mg by mouth daily.    Marland Kitchen CALCIUM PO Take 1,200 mg by mouth daily.    . cetirizine (ZYRTEC) 10 MG tablet Take 10 mg by mouth daily.    . Cholecalciferol (DIALYVITE VITAMIN D 5000) 125 MCG (5000 UT) capsule Take 5,000 Units by mouth daily.    Marland Kitchen DILT-XR 180 MG 24 hr capsule Take 180 mg by mouth daily.     . DULoxetine (CYMBALTA) 60 MG capsule Take 60 mg by mouth daily.    . fluticasone (CUTIVATE) 0.05 %  cream Apply 1 application topically 2 (two) times daily as needed (eczema).    . fluticasone (FLONASE) 50 MCG/ACT nasal spray Place 2 sprays into the nose daily.    . Fluticasone Furoate (ARNUITY ELLIPTA) 100 MCG/ACT AEPB Inhale 1 Dose into the lungs daily. (Patient taking differently: Inhale 1 puff into the lungs daily. ) 3 each 1  . hydrochlorothiazide (HYDRODIURIL) 12.5 MG tablet Take 12.5 mg by mouth daily.     Marland Kitchen HYDROcodone-acetaminophen (NORCO/VICODIN) 5-325 MG tablet Take 1 tablet by mouth every 6 (six) hours as needed for moderate pain. 15 tablet 0  . ketoconazole (NIZORAL) 2 % cream Apply 1 application topically daily as needed (fungus).    . meloxicam (MOBIC) 15 MG tablet Take 15 mg by mouth daily as needed for pain.    . Misc Natural Products (OSTEO BI-FLEX ADV TRIPLE ST PO) Take 2 tablets by mouth daily.    . montelukast (SINGULAIR) 10 MG tablet Take 10 mg by mouth daily.    Vladimir Faster Glycol-Propyl Glycol (SYSTANE) 0.4-0.3 % SOLN Place 1 drop into both eyes 4 (four) times daily as needed (for dry eyes).    . Probiotic Product (PROBIOTIC PO) Take 1 capsule by mouth daily.    . RABEprazole (ACIPHEX) 20 MG tablet Take 20 mg by mouth 2 (two) times daily.     Marland Kitchen TART CHERRY PO Take 1,000 mg by mouth daily.    . TURMERIC PO Take 450 mg by mouth daily.    Marland Kitchen zinc gluconate 50 MG tablet Take 50 mg by mouth daily.      No results found for this or any previous visit (from the past 48 hour(s)). No results found.  ROS: Pain with rom of the right upper extremity  Physical Exam: Alert and oriented 74 y.o. female in no acute distress Cranial nerves 2-12 intact Cervical spine: full rom with no tenderness, nv intact distally Chest: active breath sounds bilaterally, no wheeze rhonchi or rales Heart: regular rate and rhythm, no murmur Abd: non tender non distended with active bowel sounds Hip is stable with rom  Right shoulder painful rom Weakness with ER and IR No rashes or edema  distally  Assessment/Plan Assessment: right shoulder cuff arthropathy  Plan:  Patient will undergo a right reverse total shoulder by Dr. Veverly Fells at [facility] Risks benefits and expectations were discussed with the patient. Patient understand risks, benefits and expectations and wishes to proceed. Preoperative templating of the joint replacement has been completed, documented, and submitted to the Operating Room personnel in order to optimize intra-operative equipment management.   Merla Riches PA-C, MPAS [facility] is now [employer] [address], [place], [place], [place] [address] Phone: [phone] [email or web address] Facebook  [employer]

## 2019-11-16 NOTE — Anesthesia Preprocedure Evaluation (Addendum)
Anesthesia Evaluation  Patient identified by MRN, date of birth, ID band Patient awake    Reviewed: Patient's Chart, lab work & pertinent test results  History of Anesthesia Complications (+) PONV  Airway Mallampati: II  TM Distance: >3 FB Neck ROM: Full    Dental  (+) Teeth Intact   Pulmonary asthma , COPD,  COPD inhaler,    Pulmonary exam normal        Cardiovascular hypertension, Pt. on medications  Rhythm:Regular Rate:Normal     Neuro/Psych Anxiety Depression    GI/Hepatic Neg liver ROS, hiatal hernia, GERD  Medicated,  Endo/Other  negative endocrine ROS  Renal/GU negative Renal ROS  negative genitourinary   Musculoskeletal  (+) Arthritis , Osteoarthritis,    Abdominal (+)  Abdomen: soft. Bowel sounds: normal.  Peds  Hematology negative hematology ROS (+)   Anesthesia Other Findings   Reproductive/Obstetrics                            Anesthesia Physical Anesthesia Plan  ASA: II  Anesthesia Plan: General and Regional   Post-op Pain Management:  Regional for Post-op pain   Induction: Intravenous  PONV Risk Score and Plan: 4 or greater and Ondansetron, Dexamethasone, Midazolam, Treatment may vary due to age or medical condition and Propofol infusion  Airway Management Planned: Mask and Oral ETT  Additional Equipment: None  Intra-op Plan:   Post-operative Plan: Extubation in OR  Informed Consent: I have reviewed the patients History and Physical, chart, labs and discussed the procedure including the risks, benefits and alternatives for the proposed anesthesia with the patient or authorized representative who has indicated his/her understanding and acceptance.     Dental advisory given  Plan Discussed with: CRNA  Anesthesia Plan Comments: (Lab Results      Component                Value               Date                      WBC                      10.3                 11/10/2019                HGB                      14.9                11/10/2019                HCT                      46.0                11/10/2019                MCV                      86.5                11/10/2019                PLT  274                 11/10/2019          )       Anesthesia Quick Evaluation

## 2019-11-17 ENCOUNTER — Other Ambulatory Visit: Payer: Self-pay

## 2019-11-17 ENCOUNTER — Observation Stay (HOSPITAL_COMMUNITY): Payer: PPO

## 2019-11-17 ENCOUNTER — Encounter (HOSPITAL_COMMUNITY): Payer: Self-pay | Admitting: Orthopedic Surgery

## 2019-11-17 ENCOUNTER — Ambulatory Visit (HOSPITAL_COMMUNITY): Payer: PPO | Admitting: Certified Registered Nurse Anesthetist

## 2019-11-17 ENCOUNTER — Observation Stay (HOSPITAL_COMMUNITY)
Admission: RE | Admit: 2019-11-17 | Discharge: 2019-11-18 | Disposition: A | Discharge status: Home / Self Care | Payer: PPO | Attending: Orthopedic Surgery | Admitting: Orthopedic Surgery

## 2019-11-17 ENCOUNTER — Encounter (HOSPITAL_COMMUNITY)
Admission: RE | Disposition: A | Discharge status: Home / Self Care | Payer: Self-pay | Source: Home / Self Care | Attending: Orthopedic Surgery

## 2019-11-17 DIAGNOSIS — Z96651 Presence of right artificial knee joint: Secondary | ICD-10-CM | POA: Insufficient documentation

## 2019-11-17 DIAGNOSIS — I10 Essential (primary) hypertension: Secondary | ICD-10-CM | POA: Diagnosis not present

## 2019-11-17 DIAGNOSIS — M19011 Primary osteoarthritis, right shoulder: Secondary | ICD-10-CM | POA: Diagnosis not present

## 2019-11-17 DIAGNOSIS — M75101 Unspecified rotator cuff tear or rupture of right shoulder, not specified as traumatic: Secondary | ICD-10-CM | POA: Diagnosis not present

## 2019-11-17 DIAGNOSIS — J45909 Unspecified asthma, uncomplicated: Secondary | ICD-10-CM | POA: Insufficient documentation

## 2019-11-17 DIAGNOSIS — J449 Chronic obstructive pulmonary disease, unspecified: Secondary | ICD-10-CM | POA: Insufficient documentation

## 2019-11-17 DIAGNOSIS — M12811 Other specific arthropathies, not elsewhere classified, right shoulder: Secondary | ICD-10-CM | POA: Diagnosis not present

## 2019-11-17 DIAGNOSIS — J440 Chronic obstructive pulmonary disease with acute lower respiratory infection: Secondary | ICD-10-CM | POA: Diagnosis not present

## 2019-11-17 DIAGNOSIS — Z96611 Presence of right artificial shoulder joint: Secondary | ICD-10-CM | POA: Diagnosis not present

## 2019-11-17 DIAGNOSIS — M25511 Pain in right shoulder: Secondary | ICD-10-CM | POA: Diagnosis present

## 2019-11-17 DIAGNOSIS — Z471 Aftercare following joint replacement surgery: Secondary | ICD-10-CM | POA: Diagnosis not present

## 2019-11-17 DIAGNOSIS — K219 Gastro-esophageal reflux disease without esophagitis: Secondary | ICD-10-CM | POA: Diagnosis not present

## 2019-11-17 DIAGNOSIS — G8918 Other acute postprocedural pain: Secondary | ICD-10-CM | POA: Diagnosis not present

## 2019-11-17 DIAGNOSIS — Z79899 Other long term (current) drug therapy: Secondary | ICD-10-CM | POA: Insufficient documentation

## 2019-11-17 HISTORY — PX: REVERSE SHOULDER ARTHROPLASTY: SHX5054

## 2019-11-17 SURGERY — ARTHROPLASTY, SHOULDER, TOTAL, REVERSE
Anesthesia: Regional | Site: Shoulder | Laterality: Right

## 2019-11-17 MED ORDER — PROPOFOL 10 MG/ML IV BOLUS
INTRAVENOUS | Status: AC
  Filled 2019-11-17: qty 20

## 2019-11-17 MED ORDER — ONDANSETRON HCL 4 MG PO TABS: 4.0000 mg | ORAL_TABLET | Freq: Four times a day (QID) | ORAL | Status: DC | PRN

## 2019-11-17 MED ORDER — CHLORHEXIDINE GLUCONATE 0.12 % MT SOLN
15.0000 mL | Freq: Once | OROMUCOSAL | Status: AC
  Administered 2019-11-17: 15 mL via OROMUCOSAL

## 2019-11-17 MED ORDER — DEXAMETHASONE SODIUM PHOSPHATE 10 MG/ML IJ SOLN
INTRAMUSCULAR | Status: DC | PRN
  Administered 2019-11-17: 5 mg

## 2019-11-17 MED ORDER — ONDANSETRON HCL 4 MG/2ML IJ SOLN
INTRAMUSCULAR | Status: DC | PRN
  Administered 2019-11-17: 4 mg via INTRAVENOUS

## 2019-11-17 MED ORDER — ACETAMINOPHEN 10 MG/ML IV SOLN: 1000.0000 mg | Freq: Once | INTRAVENOUS | Status: DC | PRN

## 2019-11-17 MED ORDER — MORPHINE SULFATE (PF) 2 MG/ML IV SOLN: 0.5000 mg | INTRAVENOUS | Status: DC | PRN

## 2019-11-17 MED ORDER — MIDAZOLAM HCL 5 MG/5ML IJ SOLN
INTRAMUSCULAR | Status: DC | PRN
  Administered 2019-11-17: 1 mg via INTRAVENOUS

## 2019-11-17 MED ORDER — LORATADINE 10 MG PO TABS
10.0000 mg | ORAL_TABLET | Freq: Every day | ORAL | Status: DC
  Administered 2019-11-18: 10 mg via ORAL
  Filled 2019-11-17: qty 1

## 2019-11-17 MED ORDER — 0.9 % SODIUM CHLORIDE (POUR BTL) OPTIME
TOPICAL | Status: DC | PRN
  Administered 2019-11-17: 1000 mL

## 2019-11-17 MED ORDER — FLUTICASONE PROPIONATE 0.05 % EX CREA: 1.0000 "application " | TOPICAL_CREAM | Freq: Two times a day (BID) | CUTANEOUS | Status: DC | PRN

## 2019-11-17 MED ORDER — STERILE WATER FOR IRRIGATION IR SOLN
Status: DC | PRN
  Administered 2019-11-17: 2000 mL

## 2019-11-17 MED ORDER — PANTOPRAZOLE SODIUM 40 MG PO TBEC
40.0000 mg | DELAYED_RELEASE_TABLET | Freq: Every day | ORAL | Status: DC
  Administered 2019-11-18: 40 mg via ORAL
  Filled 2019-11-17: qty 1

## 2019-11-17 MED ORDER — ONDANSETRON HCL 4 MG/2ML IJ SOLN: 4.0000 mg | Freq: Four times a day (QID) | INTRAMUSCULAR | Status: DC | PRN

## 2019-11-17 MED ORDER — ROCURONIUM BROMIDE 10 MG/ML (PF) SYRINGE
PREFILLED_SYRINGE | INTRAVENOUS | Status: AC
  Filled 2019-11-17: qty 10

## 2019-11-17 MED ORDER — FENTANYL CITRATE (PF) 100 MCG/2ML IJ SOLN: 25.0000 ug | INTRAMUSCULAR | Status: DC | PRN

## 2019-11-17 MED ORDER — ONDANSETRON HCL 4 MG/2ML IJ SOLN: 4.0000 mg | Freq: Once | INTRAMUSCULAR | Status: DC | PRN

## 2019-11-17 MED ORDER — BUPIVACAINE-EPINEPHRINE (PF) 0.25% -1:200000 IJ SOLN
INTRAMUSCULAR | Status: DC | PRN
  Administered 2019-11-17: 8 mL

## 2019-11-17 MED ORDER — CEFAZOLIN SODIUM-DEXTROSE 2-4 GM/100ML-% IV SOLN
2.0000 g | Freq: Four times a day (QID) | INTRAVENOUS | Status: AC
  Administered 2019-11-17 – 2019-11-18 (×3): 2 g via INTRAVENOUS
  Filled 2019-11-17 (×3): qty 100

## 2019-11-17 MED ORDER — HYDROCODONE-ACETAMINOPHEN 5-325 MG PO TABS: 1.0000 | ORAL_TABLET | ORAL | Status: DC | PRN

## 2019-11-17 MED ORDER — SODIUM CHLORIDE 0.9 % IR SOLN: Status: DC | PRN

## 2019-11-17 MED ORDER — MENTHOL 3 MG MT LOZG: 1.0000 | LOZENGE | OROMUCOSAL | Status: DC | PRN

## 2019-11-17 MED ORDER — PHENYLEPHRINE HCL (PRESSORS) 10 MG/ML IV SOLN
INTRAVENOUS | Status: AC
  Filled 2019-11-17: qty 4

## 2019-11-17 MED ORDER — LIDOCAINE 2% (20 MG/ML) 5 ML SYRINGE
INTRAMUSCULAR | Status: DC | PRN
  Administered 2019-11-17: 60 mg via INTRAVENOUS

## 2019-11-17 MED ORDER — DEXAMETHASONE SODIUM PHOSPHATE 10 MG/ML IJ SOLN
INTRAMUSCULAR | Status: AC
  Filled 2019-11-17: qty 1

## 2019-11-17 MED ORDER — MELOXICAM 15 MG PO TABS
15.0000 mg | ORAL_TABLET | Freq: Every day | ORAL | Status: DC
  Administered 2019-11-18: 15 mg via ORAL
  Filled 2019-11-17: qty 1

## 2019-11-17 MED ORDER — MONTELUKAST SODIUM 10 MG PO TABS
10.0000 mg | ORAL_TABLET | Freq: Every day | ORAL | Status: DC
  Administered 2019-11-17 – 2019-11-18 (×2): 10 mg via ORAL
  Filled 2019-11-17 (×2): qty 1

## 2019-11-17 MED ORDER — METOCLOPRAMIDE HCL 5 MG PO TABS: 5.0000 mg | ORAL_TABLET | Freq: Three times a day (TID) | ORAL | Status: DC | PRN

## 2019-11-17 MED ORDER — DULOXETINE HCL 60 MG PO CPEP
60.0000 mg | ORAL_CAPSULE | Freq: Every day | ORAL | Status: DC
  Administered 2019-11-18: 60 mg via ORAL
  Filled 2019-11-17: qty 1

## 2019-11-17 MED ORDER — MIDAZOLAM HCL 2 MG/2ML IJ SOLN
INTRAMUSCULAR | Status: AC
  Filled 2019-11-17: qty 2

## 2019-11-17 MED ORDER — BUPIVACAINE-EPINEPHRINE (PF) 0.25% -1:200000 IJ SOLN
INTRAMUSCULAR | Status: AC
  Filled 2019-11-17: qty 30

## 2019-11-17 MED ORDER — ATORVASTATIN CALCIUM 10 MG PO TABS
10.0000 mg | ORAL_TABLET | Freq: Every day | ORAL | Status: DC
  Administered 2019-11-17 – 2019-11-18 (×2): 10 mg via ORAL
  Filled 2019-11-17 (×2): qty 1

## 2019-11-17 MED ORDER — ALPRAZOLAM 0.25 MG PO TABS: 0.2500 mg | ORAL_TABLET | Freq: Two times a day (BID) | ORAL | Status: DC | PRN

## 2019-11-17 MED ORDER — TART CHERRY 1200 MG PO CAPS: 1000.0000 mg | ORAL_CAPSULE | Freq: Every day | ORAL | Status: DC

## 2019-11-17 MED ORDER — ACETAMINOPHEN 325 MG PO TABS: 325.0000 mg | ORAL_TABLET | Freq: Four times a day (QID) | ORAL | Status: DC | PRN

## 2019-11-17 MED ORDER — BUPIVACAINE LIPOSOME 1.3 % IJ SUSP
INTRAMUSCULAR | Status: DC | PRN
  Administered 2019-11-17: 10 mL via PERINEURAL

## 2019-11-17 MED ORDER — FENTANYL CITRATE (PF) 100 MCG/2ML IJ SOLN
INTRAMUSCULAR | Status: AC
  Filled 2019-11-17: qty 2

## 2019-11-17 MED ORDER — ORAL CARE MOUTH RINSE: 15.0000 mL | Freq: Once | OROMUCOSAL | Status: AC

## 2019-11-17 MED ORDER — BUDESONIDE 0.25 MG/2ML IN SUSP
0.2500 mg | Freq: Two times a day (BID) | RESPIRATORY_TRACT | Status: DC
  Administered 2019-11-17 – 2019-11-18 (×2): 0.25 mg via RESPIRATORY_TRACT
  Filled 2019-11-17 (×2): qty 2

## 2019-11-17 MED ORDER — PHENYLEPHRINE HCL-NACL 20-0.9 MG/250ML-% IV SOLN
INTRAVENOUS | Status: DC | PRN
  Administered 2019-11-17: 25 ug/min via INTRAVENOUS

## 2019-11-17 MED ORDER — DEXAMETHASONE SODIUM PHOSPHATE 10 MG/ML IJ SOLN
INTRAMUSCULAR | Status: DC | PRN
  Administered 2019-11-17: 4 mg via INTRAVENOUS

## 2019-11-17 MED ORDER — POLYETHYLENE GLYCOL 3350 17 G PO PACK: 17.0000 g | PACK | Freq: Every day | ORAL | Status: DC | PRN

## 2019-11-17 MED ORDER — PROPOFOL 10 MG/ML IV BOLUS
INTRAVENOUS | Status: DC | PRN
  Administered 2019-11-17: 160 mg via INTRAVENOUS
  Administered 2019-11-17: 20 mg via INTRAVENOUS

## 2019-11-17 MED ORDER — VITAMIN D3 25 MCG (1000 UNIT) PO TABS
5000.0000 [IU] | ORAL_TABLET | Freq: Every day | ORAL | Status: DC
  Administered 2019-11-18: 5000 [IU] via ORAL
  Filled 2019-11-17 (×2): qty 5

## 2019-11-17 MED ORDER — BUPIVACAINE HCL (PF) 0.5 % IJ SOLN
INTRAMUSCULAR | Status: DC | PRN
  Administered 2019-11-17: 15 mL via PERINEURAL

## 2019-11-17 MED ORDER — METHOCARBAMOL 500 MG PO TABS
500.0000 mg | ORAL_TABLET | Freq: Four times a day (QID) | ORAL | Status: DC | PRN
  Filled 2019-11-17: qty 1

## 2019-11-17 MED ORDER — CEFAZOLIN SODIUM-DEXTROSE 2-4 GM/100ML-% IV SOLN
2.0000 g | INTRAVENOUS | Status: AC
  Administered 2019-11-17: 2 g via INTRAVENOUS

## 2019-11-17 MED ORDER — METHOCARBAMOL 500 MG IVPB - SIMPLE MED
500.0000 mg | Freq: Four times a day (QID) | INTRAVENOUS | Status: DC | PRN
  Filled 2019-11-17: qty 50

## 2019-11-17 MED ORDER — ONDANSETRON HCL 4 MG/2ML IJ SOLN
INTRAMUSCULAR | Status: AC
  Filled 2019-11-17: qty 2

## 2019-11-17 MED ORDER — PROPOFOL 500 MG/50ML IV EMUL
INTRAVENOUS | Status: DC | PRN
  Administered 2019-11-17: 25 ug/kg/min via INTRAVENOUS

## 2019-11-17 MED ORDER — ALBUTEROL SULFATE HFA 108 (90 BASE) MCG/ACT IN AERS: 1.0000 | INHALATION_SPRAY | Freq: Four times a day (QID) | RESPIRATORY_TRACT | Status: DC | PRN

## 2019-11-17 MED ORDER — FLUTICASONE PROPIONATE 50 MCG/ACT NA SUSP
2.0000 | Freq: Every day | NASAL | Status: DC
  Administered 2019-11-17 – 2019-11-18 (×2): 2 via NASAL
  Filled 2019-11-17: qty 16

## 2019-11-17 MED ORDER — LACTATED RINGERS IV SOLN: INTRAVENOUS | Status: DC

## 2019-11-17 MED ORDER — SODIUM CHLORIDE 0.9 % IV SOLN: INTRAVENOUS | Status: DC

## 2019-11-17 MED ORDER — DILTIAZEM HCL ER COATED BEADS 180 MG PO CP24
180.0000 mg | ORAL_CAPSULE | Freq: Every day | ORAL | Status: DC
  Administered 2019-11-18: 180 mg via ORAL
  Filled 2019-11-17: qty 1

## 2019-11-17 MED ORDER — PHENOL 1.4 % MT LIQD: 1.0000 | OROMUCOSAL | Status: DC | PRN

## 2019-11-17 MED ORDER — PHENYLEPHRINE 40 MCG/ML (10ML) SYRINGE FOR IV PUSH (FOR BLOOD PRESSURE SUPPORT)
PREFILLED_SYRINGE | INTRAVENOUS | Status: DC | PRN
  Administered 2019-11-17 (×3): 80 ug via INTRAVENOUS

## 2019-11-17 MED ORDER — EPHEDRINE SULFATE-NACL 50-0.9 MG/10ML-% IV SOSY
PREFILLED_SYRINGE | INTRAVENOUS | Status: DC | PRN
  Administered 2019-11-17 (×4): 10 mg via INTRAVENOUS

## 2019-11-17 MED ORDER — CEFAZOLIN SODIUM-DEXTROSE 2-4 GM/100ML-% IV SOLN
INTRAVENOUS | Status: AC
  Filled 2019-11-17: qty 100

## 2019-11-17 MED ORDER — DOCUSATE SODIUM 100 MG PO CAPS
100.0000 mg | ORAL_CAPSULE | Freq: Two times a day (BID) | ORAL | Status: DC
  Administered 2019-11-17 – 2019-11-18 (×2): 100 mg via ORAL
  Filled 2019-11-17 (×2): qty 1

## 2019-11-17 MED ORDER — BUDESONIDE 0.25 MG/2ML IN SUSP
0.2500 mg | Freq: Two times a day (BID) | RESPIRATORY_TRACT | Status: DC
Start: 2019-11-17 — End: 2019-11-17

## 2019-11-17 MED ORDER — HYDROCODONE-ACETAMINOPHEN 5-325 MG PO TABS: 1.0000 | ORAL_TABLET | Freq: Four times a day (QID) | ORAL | 0 refills | Status: DC | PRN

## 2019-11-17 MED ORDER — ACETAMINOPHEN 325 MG PO TABS: 650.0000 mg | ORAL_TABLET | Freq: Four times a day (QID) | ORAL | Status: DC | PRN

## 2019-11-17 MED ORDER — SUCCINYLCHOLINE CHLORIDE 200 MG/10ML IV SOSY
PREFILLED_SYRINGE | INTRAVENOUS | Status: DC | PRN
  Administered 2019-11-17: 100 mg via INTRAVENOUS

## 2019-11-17 MED ORDER — ZINC SULFATE 220 (50 ZN) MG PO CAPS
220.0000 mg | ORAL_CAPSULE | Freq: Every day | ORAL | Status: DC
  Administered 2019-11-18: 220 mg via ORAL
  Filled 2019-11-17: qty 1

## 2019-11-17 MED ORDER — BISACODYL 10 MG RE SUPP: 10.0000 mg | Freq: Every day | RECTAL | Status: DC | PRN

## 2019-11-17 MED ORDER — METOCLOPRAMIDE HCL 5 MG/ML IJ SOLN: 5.0000 mg | Freq: Three times a day (TID) | INTRAMUSCULAR | Status: DC | PRN

## 2019-11-17 MED ORDER — FENTANYL CITRATE (PF) 100 MCG/2ML IJ SOLN
INTRAMUSCULAR | Status: DC | PRN
  Administered 2019-11-17 (×3): 25 ug via INTRAVENOUS
  Administered 2019-11-17: 50 ug via INTRAVENOUS

## 2019-11-17 MED ORDER — TURMERIC 500 MG PO CAPS: 450.0000 mg | ORAL_CAPSULE | Freq: Every day | ORAL | Status: DC

## 2019-11-17 MED ORDER — LIDOCAINE 2% (20 MG/ML) 5 ML SYRINGE
INTRAMUSCULAR | Status: AC
  Filled 2019-11-17: qty 5

## 2019-11-17 MED ORDER — HYDROCHLOROTHIAZIDE 25 MG PO TABS
12.5000 mg | ORAL_TABLET | Freq: Every day | ORAL | Status: DC
  Administered 2019-11-18: 12.5 mg via ORAL
  Filled 2019-11-17: qty 1

## 2019-11-17 MED ORDER — POLYVINYL ALCOHOL 1.4 % OP SOLN
1.0000 [drp] | Freq: Four times a day (QID) | OPHTHALMIC | Status: DC | PRN
  Filled 2019-11-17: qty 15

## 2019-11-17 MED ORDER — KETOCONAZOLE 2 % EX CREA
1.0000 "application " | TOPICAL_CREAM | Freq: Every day | CUTANEOUS | Status: DC | PRN
  Filled 2019-11-17: qty 15

## 2019-11-17 MED ORDER — PROPOFOL 500 MG/50ML IV EMUL
INTRAVENOUS | Status: AC
  Filled 2019-11-17: qty 50

## 2019-11-17 SURGICAL SUPPLY — 79 items
AID PSTN UNV HD RSTRNT DISP (MISCELLANEOUS) ×1
BAG SPEC THK2 15X12 ZIP CLS (MISCELLANEOUS)
BAG ZIPLOCK 12X15 (MISCELLANEOUS) IMPLANT
BIT DRILL 1.6MX128 (BIT) IMPLANT
BIT DRILL 1.6MX128MM (BIT)
BIT DRILL 170X2.5X (BIT) IMPLANT
BIT DRL 170X2.5X (BIT) ×1
BLADE SAG 18X100X1.27 (BLADE) ×3 IMPLANT
CLOSURE WOUND 1/2 X4 (GAUZE/BANDAGES/DRESSINGS) ×1
COVER BACK TABLE 60X90IN (DRAPES) ×3 IMPLANT
COVER SURGICAL LIGHT HANDLE (MISCELLANEOUS) ×3 IMPLANT
COVER WAND RF STERILE (DRAPES) IMPLANT
DECANTER SPIKE VIAL GLASS SM (MISCELLANEOUS) ×3 IMPLANT
DRAPE INCISE IOBAN 66X45 STRL (DRAPES) ×3 IMPLANT
DRAPE ORTHO SPLIT 77X108 STRL (DRAPES) ×6
DRAPE SHEET LG 3/4 BI-LAMINATE (DRAPES) ×3 IMPLANT
DRAPE SURG ORHT 6 SPLT 77X108 (DRAPES) ×2 IMPLANT
DRAPE TOP 10253 STERILE (DRAPES) ×3 IMPLANT
DRAPE U-SHAPE 47X51 STRL (DRAPES) ×3 IMPLANT
DRILL 2.5 (BIT) ×3
DRSG ADAPTIC 3X8 NADH LF (GAUZE/BANDAGES/DRESSINGS) ×3 IMPLANT
DRSG PAD ABDOMINAL 8X10 ST (GAUZE/BANDAGES/DRESSINGS) ×3 IMPLANT
DURAPREP 26ML APPLICATOR (WOUND CARE) ×3 IMPLANT
ECCENTRIC EPIPHYSI MODULAR SZ1 (Trauma) IMPLANT
ELECT BLADE TIP CTD 4 INCH (ELECTRODE) ×3 IMPLANT
ELECT NDL TIP 2.8 STRL (NEEDLE) ×1 IMPLANT
ELECT NEEDLE TIP 2.8 STRL (NEEDLE) ×3 IMPLANT
ELECT REM PT RETURN 15FT ADLT (MISCELLANEOUS) ×3 IMPLANT
FACESHIELD WRAPAROUND (MASK) ×3 IMPLANT
FACESHIELD WRAPAROUND OR TEAM (MASK) ×1 IMPLANT
GAUZE SPONGE 4X4 12PLY STRL (GAUZE/BANDAGES/DRESSINGS) ×3 IMPLANT
GLENOSPHERE DELTA XTEND LAT 38 (Miscellaneous) ×2 IMPLANT
GLOVE BIOGEL PI ORTHO PRO 7.5 (GLOVE) ×2
GLOVE BIOGEL PI ORTHO PRO SZ8 (GLOVE) ×2
GLOVE ORTHO TXT STRL SZ7.5 (GLOVE) ×3 IMPLANT
GLOVE PI ORTHO PRO STRL 7.5 (GLOVE) ×1 IMPLANT
GLOVE PI ORTHO PRO STRL SZ8 (GLOVE) ×1 IMPLANT
GLOVE SURG ORTHO 8.5 STRL (GLOVE) ×3 IMPLANT
GOWN STRL REUS W/TWL XL LVL3 (GOWN DISPOSABLE) ×6 IMPLANT
KIT BASIN OR (CUSTOM PROCEDURE TRAY) ×3 IMPLANT
KIT TURNOVER KIT A (KITS) IMPLANT
MANIFOLD NEPTUNE II (INSTRUMENTS) ×3 IMPLANT
METAGLENE DELTA EXTEND (Trauma) IMPLANT
METAGLENE DXTEND (Trauma) ×3 IMPLANT
MODULAR ECCENTRIC EPIPHYSI SZ1 (Trauma) ×3 IMPLANT
NDL MAYO CATGUT SZ4 TPR NDL (NEEDLE) IMPLANT
NEEDLE MAYO CATGUT SZ4 (NEEDLE) IMPLANT
NS IRRIG 1000ML POUR BTL (IV SOLUTION) ×3 IMPLANT
PACK SHOULDER (CUSTOM PROCEDURE TRAY) ×3 IMPLANT
PENCIL SMOKE EVACUATOR (MISCELLANEOUS) IMPLANT
PIN GUIDE 1.2 (PIN) ×2 IMPLANT
PIN GUIDE GLENOPHERE 1.5MX300M (PIN) ×2 IMPLANT
PIN METAGLENE 2.5 (PIN) ×2 IMPLANT
PROTECTOR NERVE ULNAR (MISCELLANEOUS) ×3 IMPLANT
RESTRAINT HEAD UNIVERSAL NS (MISCELLANEOUS) ×3 IMPLANT
SCREW 4.5X18MM (Screw) ×3 IMPLANT
SCREW 4.5X24MM (Screw) ×3 IMPLANT
SCREW BN 18X4.5XSTRL SHLDR (Screw) IMPLANT
SCREW BN 24X4.5XLCK STRL (Screw) IMPLANT
SCREW LOCK DELTA XTEND 4.5X30 (Screw) ×2 IMPLANT
SLING ARM FOAM STRAP LRG (SOFTGOODS) ×2 IMPLANT
SMARTMIX MINI TOWER (MISCELLANEOUS)
SPACER 38 PLUS 3 (Spacer) ×2 IMPLANT
SPONGE LAP 4X18 RFD (DISPOSABLE) IMPLANT
STEM HUMERAL SZ8 STANDARD (Stem) ×3 IMPLANT
STEM HUMERAL SZ8 STD (Stem) IMPLANT
STRIP CLOSURE SKIN 1/2X4 (GAUZE/BANDAGES/DRESSINGS) ×2 IMPLANT
SUCTION FRAZIER HANDLE 10FR (MISCELLANEOUS) ×3
SUCTION TUBE FRAZIER 10FR DISP (MISCELLANEOUS) ×1 IMPLANT
SUT FIBERWIRE #2 38 T-5 BLUE (SUTURE) ×6
SUT MNCRL AB 4-0 PS2 18 (SUTURE) ×3 IMPLANT
SUT VIC AB 0 CT1 36 (SUTURE) ×6 IMPLANT
SUT VIC AB 0 CT2 27 (SUTURE) ×3 IMPLANT
SUT VIC AB 2-0 CT1 27 (SUTURE) ×3
SUT VIC AB 2-0 CT1 TAPERPNT 27 (SUTURE) ×1 IMPLANT
SUTURE FIBERWR #2 38 T-5 BLUE (SUTURE) ×2 IMPLANT
TAPE CLOTH SURG 4X10 WHT LF (GAUZE/BANDAGES/DRESSINGS) ×2 IMPLANT
TOWEL OR 17X26 10 PK STRL BLUE (TOWEL DISPOSABLE) ×3 IMPLANT
TOWER SMARTMIX MINI (MISCELLANEOUS) IMPLANT

## 2019-11-17 NOTE — Interval H&P Note (Signed)
History and Physical Interval Note:  11/17/2019 7:30 AM  Tasha Hunt  has presented today for surgery, with the diagnosis of Right shoulder cuff arthropathy.  The various methods of treatment have been discussed with the patient and family. After consideration of risks, benefits and other options for treatment, the patient has consented to  Procedure(s) with comments: REVERSE SHOULDER ARTHROPLASTY (Right) - interscalene block as a surgical intervention.  The patient's history has been reviewed, patient examined, no change in status, stable for surgery.  I have reviewed the patient's chart and labs.  Questions were answered to the patient's satisfaction.     Augustin Schooling

## 2019-11-17 NOTE — Op Note (Signed)
NAMEFREDDYE, CARDAMONE [facility] MEDICAL RECORD [id number] ACCOUNT [id number] DATE OF BIRTH:06/05/1945 FACILITY: [facility] LOCATION: [facility] PHYSICIAN:STEVEN Orlena Sheldon, MD  OPERATIVE REPORT  DATE OF PROCEDURE:  11/17/2019  PREOPERATIVE DIAGNOSIS:  Right shoulder rotator cuff tear arthropathy.  POSTOPERATIVE DIAGNOSIS:  Right shoulder rotator cuff tear arthropathy.  PROCEDURE PERFORMED:  Right reverse total shoulder replacement using DePuy Delta Xtend prosthesis with no subscap repair.  ATTENDING SURGEON:  Esmond Plants, MD  ASSISTANT:  Darol Destine, [place], who was scrubbed during the entire procedure and necessary for satisfactory completion of surgery.  ANESTHESIA:  General anesthesia was used plus interscalene block.  ESTIMATED BLOOD LOSS:  150 mL.  FLUID REPLACEMENT:  1500 mL crystalloid.  INSTRUMENT COUNTS:  Correct.  COMPLICATIONS:  No complications.  ANTIBIOTICS:  Perioperative antibiotics were given.  INDICATIONS:  The patient is a 74 year old female with a history of worsening right shoulder pain and dysfunction secondary to end-stage arthritis with rotator cuff tear arthropathy.  The patient presents having failed an extended period of conservative  management.  Desires operative treatment to restore function and eliminate pain in the shoulder.  Informed consent obtained.  DESCRIPTION OF PROCEDURE:  After an adequate level of general anesthesia was achieved, the patient was positioned in modified beach chair position.  Right shoulder correctly identified and sterilely prepped and draped in the usual manner.  Timeout was  called, verifying correct patient, correct site.  We entered the patient's shoulder using standard deltopectoral approach starting at the coracoid process extending down to the anterior humerus.  Dissection down through subcutaneous tissues using a  needle tip Bovie.  We identified cephalic vein, took that laterally with the deltoid pectoralis taken  medially.  Conjoined tendon identified and retracted medially.  We tenodesed the biceps in situ with 0 Vicryl figure-of-eight suture.  We then released  the subscapularis, which was in bad shape.  We tagged for protection of the axillary nerve.  We released the inferior capsule progressively externally rotating and delivering the humerus out of the wound.  We entered the proximal humerus with a 6 mm  reamer, reaming up to a size 8 and encountering cortical bone distally.  We then took the 8 mm intramedullary ____ guide and resected the humeral head at 10 degrees of retroversion for the 155 prosthesis.  We then removed excess osteophytes with a  rongeur.  We then subluxed the humerus posteriorly gaining good exposure of the glenoid face with retractors.  We removed the labrum and capsule and the biceps.  We had a good look at the glenoid face.  We went ahead and used the Cobb to remove excess  cartilage that was remaining.  We then placed our guide pin which centered low on the glenoid for the reaming.  We then reamed for the metaglene baseplate, drilled out our central peg hole and then impacted the metaglene into position.  We then placed a  30 screw inferiorly, a 24 screw superiorly and an 18 anteriorly.  We locked the superior and inferior screws.  We had a really good baseplate support.  We then went ahead and took the 38 standard glenosphere and attached that to the baseplate.  At this  point, we irrigated thoroughly.  I did a finger sweep to make sure there was no soft tissue caught in between the glenosphere and the metaglene baseplate.  Next, we went back to the humeral side.  We finished our humeral preparation reaming for the 1  right metaphysis.  Once we had that  done, we trialed with the 8 stem 1 right metaphysis and then impacted the HA coated metaphysis and it was a Por[place]oat size 8 stem set on the 0 setting and impacted in 10 degrees of retroversion.  We used available bone  graft in impaction  grafting technique and had a nice stable stem.  We then trialed with the 38+3 poly.  We were happy with that soft tissue balancing.  We thus selected the real 38+3 poly impacted on the humeral tray and reduced the shoulder, had nice  little pop as it reduced.  It was stable with no gapping with inferior pole and external rotation and the conjoined was appropriately tight.  We thoroughly irrigated and resected the remnant of the subscap.  We went ahead and repaired the deltopectoral  interval with 0 Vicryl suture followed by 2-0 Vicryl for subcutaneous closure and 4-0 Monocryl for skin.  Steri-Strips were applied followed by sterile dressing.  The patient was placed in a shoulder sling and transported to the recovery room in stable  condition.  HN/NUANCE  D:11/17/2019 T:11/17/2019 JOB:[id number]

## 2019-11-17 NOTE — Brief Op Note (Signed)
11/17/2019  9:18 AM  PATIENT:  Tasha Hunt  74 y.o. female  PRE-OPERATIVE DIAGNOSIS:  Right shoulder cuff arthropathy  POST-OPERATIVE DIAGNOSIS:  Right shoulder cuff arthropathy  PROCEDURE:  Procedure(s) with comments: REVERSE SHOULDER ARTHROPLASTY (Right) - interscalene block DePuy Delta Reverse  SURGEON:  Surgeon(s) and Role:    Netta Cedars, MD - Primary  PHYSICIAN ASSISTANT:   ASSISTANTS: [place] Bruns, PA-C   ANESTHESIA:   regional and general  EBL:  200 mL   BLOOD ADMINISTERED:none  DRAINS: none   LOCAL MEDICATIONS USED:  MARCAINE     SPECIMEN:  No Specimen  DISPOSITION OF SPECIMEN:  N/A  COUNTS:  YES  TOURNIQUET:  * No tourniquets in log *  DICTATION: .Other Dictation: Dictation Number [id number]  PLAN OF CARE: Admit for overnight observation  PATIENT DISPOSITION:  PACU - hemodynamically stable.   Delay start of Pharmacological VTE agent (>24hrs) due to surgical blood loss or risk of bleeding: not applicable

## 2019-11-17 NOTE — Discharge Instructions (Signed)
Ice to the shoulder constantly.  Keep the incision covered and clean and dry for one week, then ok to get it wet in the shower. ° °Do exercise as instructed several times per day. ° °DO NOT reach behind your back or push up out of a chair with the operative arm. ° °Use a sling while you are up and around for comfort, may remove while seated.  Keep pillow propped behind the operative elbow. ° °Follow up with Dr Roselind Klus in two weeks in the office, call [phone] for appt °

## 2019-11-17 NOTE — Anesthesia Procedure Notes (Signed)
Anesthesia Regional Block: Interscalene brachial plexus block   Pre-Anesthetic Checklist: ,, timeout performed, Correct Patient, Correct Site, Correct Laterality, Correct Procedure, Correct Position, site marked, Risks and benefits discussed,  Surgical consent,  Pre-op evaluation,  At surgeon's request and post-op pain management  Laterality: Right  Prep: Dura Prep       Needles:  Injection technique: Single-shot  Needle Type: Echogenic Stimulator Needle     Needle Length: 4cm  Needle Gauge: 20     Additional Needles:   Procedures:,,,, ultrasound used (permanent image in chart),,,,  Narrative:  Start time: 11/17/2019 7:05 AM End time: 11/17/2019 7:08 AM Injection made incrementally with aspirations every 5 mL.  Performed by: Personally  Anesthesiologist: Darral Dash, DO  Additional Notes: Patient identified. Risks/Benefits/Options discussed with patient including but not limited to bleeding, infection, nerve damage, failed block, incomplete pain control. Patient expressed understanding and wished to proceed. All questions were answered. Sterile technique was used throughout the entire procedure. Please see nursing notes for vital signs. Aspirated in 5cc intervals with injection for negative confirmation. Patient was given instructions on fall risk and not to get out of bed. All questions and concerns addressed with instructions to call with any issues or inadequate analgesia.

## 2019-11-17 NOTE — Transfer of Care (Signed)
Immediate Anesthesia Transfer of Care Note  Patient: Tasha Hunt  Procedure(s) Performed: REVERSE SHOULDER ARTHROPLASTY (Right Shoulder)  Patient Location: PACU  Anesthesia Type:GA combined with regional for post-op pain  Level of Consciousness: awake, drowsy and patient cooperative  Airway & Oxygen Therapy: Patient Spontanous Breathing and Patient connected to face mask oxygen  Post-op Assessment: Report given to RN and Post -op Vital signs reviewed and stable  Post vital signs: Reviewed and stable  Last Vitals:  Vitals Value Taken Time  BP 122/85 11/17/19 0930  Temp    Pulse 96 11/17/19 0930  Resp 17 11/17/19 0930  SpO2 93 % 11/17/19 0930  Vitals shown include unvalidated device data.  Last Pain:  Vitals:   11/17/19 0553  TempSrc: Oral         Complications: No complications documented.

## 2019-11-17 NOTE — Anesthesia Postprocedure Evaluation (Signed)
Anesthesia Post Note  Patient: Tasha Hunt  Procedure(s) Performed: REVERSE SHOULDER ARTHROPLASTY (Right Shoulder)     Patient location during evaluation: PACU Anesthesia Type: Regional and General Level of consciousness: awake and alert Pain management: pain level controlled Vital Signs Assessment: post-procedure vital signs reviewed and stable Respiratory status: spontaneous breathing, nonlabored ventilation, respiratory function stable and patient connected to nasal cannula oxygen Cardiovascular status: blood pressure returned to baseline and stable Postop Assessment: no apparent nausea or vomiting Anesthetic complications: no   No complications documented.  Last Vitals:  Vitals:   11/17/19 1349 11/17/19 1453  BP: 115/61 112/61  Pulse: 92 93  Resp: 17 16  Temp: 36.6 C 36.4 C  SpO2: 92% 90%    Last Pain:  Vitals:   11/17/19 1453  TempSrc: Oral  PainSc:                  March Rummage Edmar [place]

## 2019-11-17 NOTE — Anesthesia Procedure Notes (Signed)
Procedure Name: Intubation Date/Time: 11/17/2019 7:52 AM Performed by: West Pugh, CRNA Pre-anesthesia Checklist: Patient identified, Emergency Drugs available, Suction available, Patient being monitored and Timeout performed Patient Re-evaluated:Patient Re-evaluated prior to induction Oxygen Delivery Method: Circle system utilized Preoxygenation: Pre-oxygenation with 100% oxygen Induction Type: IV induction Ventilation: Mask ventilation without difficulty Laryngoscope Size: Mac and 3 Grade View: Grade II Tube type: Oral Tube size: 7.0 mm Number of attempts: 1 Airway Equipment and Method: Stylet Placement Confirmation: ETT inserted through vocal cords under direct vision,  positive ETCO2,  CO2 detector and breath sounds checked- equal and bilateral Secured at: 21 cm Tube secured with: Tape Dental Injury: Teeth and Oropharynx as per pre-operative assessment  Comments: Small mouth opening. Grade 2 view. AOI

## 2019-11-17 NOTE — Progress Notes (Signed)
Chaplain engaged in initial visit with Tasha Hunt."  Chaplain explained her role and offered support.  Jacqlyn Larsen conveyed that she just came to the hospital earlier today.  She also has family support from her daughter while she is here.  Chaplain will continue to follow-up.    11/17/19 1400  Clinical Encounter Type  Visited With Patient  Visit Type Initial

## 2019-11-18 DIAGNOSIS — M75101 Unspecified rotator cuff tear or rupture of right shoulder, not specified as traumatic: Secondary | ICD-10-CM | POA: Diagnosis not present

## 2019-11-18 LAB — BASIC METABOLIC PANEL
Anion gap: 9 (ref 5–15)
BUN: 11 mg/dL (ref 8–23)
CO2: 27 mmol/L (ref 22–32)
Calcium: 8.4 mg/dL — ABNORMAL LOW (ref 8.9–10.3)
Chloride: 104 mmol/L (ref 98–111)
Creatinine, Ser: 0.58 mg/dL (ref 0.44–1.00)
GFR, Estimated: >60 mL/min (ref >60)
Glucose, Bld: 204 mg/dL — ABNORMAL HIGH (ref 70–99)
Potassium: 4.3 mmol/L (ref 3.5–5.1)
Sodium: 140 mmol/L (ref 135–145)

## 2019-11-18 LAB — HEMOGLOBIN AND HEMATOCRIT, BLOOD
HCT: 35.3 % — ABNORMAL LOW (ref 36.0–46.0)
Hemoglobin: 11.3 g/dL — ABNORMAL LOW (ref 12.0–15.0)

## 2019-11-18 NOTE — Progress Notes (Signed)
Received referral to assist with home O2. Contacted Jermaine with [employer] for referral.

## 2019-11-18 NOTE — Evaluation (Signed)
Occupational Therapy Evaluation Patient Details Name: Tasha Hunt MRN: [id number] DOB: 1945-01-21 Today's Date: 11/18/2019    History of Present Illness REVERSE SHOULDER ARTHROPLASTY (right)   Clinical Impression   Pt admitted for RTSA. Pt currently with functional limitations due to the deficits listed below (see OT Problem List).  Pt will benefit from skilled OT to increase their safety and independence with ADL and functional mobility for ADL to facilitate discharge to venue listed below.   Pt oxygen decreased with activity to 76 on 2L.  Rn aware. Pt increased to 90 with deep breathing.  Pt is upset about oxygen.  Pt did well with elbow, wrist and had exercise AAROM FF 0-90, abduction 0-60 and ER 0-30     Follow Up Recommendations  Follow surgeon's recommendation for DC plan and follow-up therapies    Equipment Recommendations  None recommended by OT    Recommendations for Other Services       Precautions / Restrictions Precautions Precautions: Shoulder Shoulder Interventions: Shoulder sling/immobilizer Precaution Comments: elbow, wrist, and hand ok.  AAROM FF 0-90, abduction 0-60 and ER 0-30 Required Braces or Orthoses: Sling Restrictions Weight Bearing Restrictions: No RUE Weight Bearing: Weight bearing as tolerated      Mobility Bed Mobility Overal bed mobility: Modified Independent                  Transfers Overall transfer level: Modified independent                        ADL either performed or assessed with clinical judgement     Pertinent Vitals/Pain Pain Assessment: No/denies pain           Communication Communication Communication: No difficulties   Cognition Arousal/Alertness: Awake/alert Behavior During Therapy: WFL for tasks assessed/performed Overall Cognitive Status: Within Functional Limits for tasks assessed                                           Shoulder Instructions Shoulder  Instructions Donning/doffing shirt without moving shoulder: Minimal assistance;Patient able to independently direct caregiver Method for sponge bathing under operated UE: Minimal assistance;Patient able to independently direct caregiver Donning/doffing sling/immobilizer: Minimal assistance;Patient able to independently direct caregiver Correct positioning of sling/immobilizer: Minimal assistance;Patient able to independently direct caregiver Pendulum exercises (written home exercise program): Minimal assistance;Patient able to independently direct caregiver ROM for elbow, wrist and digits of operated UE: Minimal assistance;Patient able to independently direct caregiver Sling wearing schedule (on at all times/off for ADL's): Independent Proper positioning of operated UE when showering: Minimal assistance;Patient able to independently direct caregiver    Home Living Family/patient expects to be discharged to:: Private residence Living Arrangements: Other relatives Available Help at Discharge: Family Type of Home: House Home Access: Level entry     [place]: Two level;Able to live on main level with bedroom/bathroom         Bathroom Toilet: Handicapped height     Home Equipment: Walker - 2 wheels;Shower seat          Prior Functioning/Environment Level of Independence: Independent                          OT Goals(Current goals can be found in the care plan section) Acute Rehab OT Goals Patient Stated Goal: home OT Goal Formulation: With patient  AM-PAC OT "6 Clicks" Daily Activity     Outcome Measure Help from another person eating meals?: A Little Help from another person taking care of personal grooming?: A Little Help from another person toileting, which includes using toliet, bedpan, or urinal?: A Little Help from another person bathing (including washing, rinsing, drying)?: A Little Help from another person to put on and taking off regular upper body  clothing?: A Little Help from another person to put on and taking off regular lower body clothing?: A Little 6 Click Score: 18   End of Session Nurse Communication: Mobility status  Activity Tolerance: Patient tolerated treatment well Patient left: in chair;with call bell/phone within reach                   Time: 0915-0949 OT Time Calculation (min): 34 min Charges:  OT General Charges $OT Visit: 1 Visit OT Evaluation $OT Eval Moderate Complexity: 1 Mod OT Treatments $Self Care/Home Management : 8-22 mins  Kari Baars, OT Acute Rehabilitation Services [phone] Office- [phone]     [name], Edwena Felty D 11/18/2019, 10:32 AM

## 2019-11-18 NOTE — Progress Notes (Signed)
Orthopedics Progress Note  Subjective: Stable overnight, no complaints  Objective:  Vitals:   11/18/19 0558 11/18/19 0601  BP: (!) 150/81   Pulse: 100 96  Resp: 18   Temp: 97.8 F (36.6 C)   SpO2: (!) 88% 93%    General: Awake and alert  Musculoskeletal: right shoulder dressing changed, small skin tear Neurovascularly intact  Lab Results  Component Value Date   WBC 10.3 11/10/2019   HGB 11.3 (L) 11/18/2019   HCT 35.3 (L) 11/18/2019   MCV 86.5 11/10/2019   PLT 274 11/10/2019       Component Value Date/Time   NA 140 11/18/2019 0317   K 4.3 11/18/2019 0317   CL 104 11/18/2019 0317   CO2 27 11/18/2019 0317   GLUCOSE 204 (H) 11/18/2019 0317   BUN 11 11/18/2019 0317   CREATININE 0.58 11/18/2019 0317   CALCIUM 8.4 (L) 11/18/2019 0317   GFRNONAA >60 11/18/2019 0317   GFRAA >60 07/03/2019 1013    No results found for: INR, PROTIME  Assessment/Plan: POD #1 s/p Procedure(s): REVERSE SHOULDER ARTHROPLASTY Doing well, stable for discharge after therapy Follow up in two weeks  Doran Heater. Veverly Fells, MD 11/18/2019 7:07 AM

## 2019-11-18 NOTE — Progress Notes (Signed)
SATURATION QUALIFICATIONS: (This note is used to comply with regulatory documentation for home oxygen)  Patient Saturations on Room Air at Rest = 86 %  Patient Saturations on Room Air while Ambulating = 76 %  Patient Saturations on 2 Liters of oxygen while Ambulating = 91%  Please briefly explain why patient needs home oxygen: pt has been desating since surgery requiring up to 4.5lnc after surgery. Pt is now on 2lnc and sating between 91-92 percent. Rn will contact md for this need.

## 2019-11-18 NOTE — Progress Notes (Signed)
Occupational Therapy Treatment Patient Details Name: Tasha Hunt MRN: [id number] DOB: 11/15/1945 Today's Date: 11/18/2019    History of present illness REVERSE SHOULDER ARTHROPLASTY (right)   OT comments  Pt requested a second visit from OT.  Pt teary regarding taking oxygen to bathroom.  Pt fearful this will be forever (the oxygen)  OT and RN provided reassurance   Follow Up Recommendations  Follow surgeon's recommendation for DC plan and follow-up therapies    Equipment Recommendations  None recommended by OT    Recommendations for Other Services      Precautions / Restrictions Precautions Precautions: Shoulder Shoulder Interventions: Shoulder sling/immobilizer Precaution Comments: elbow, wrist, and hand ok.  AAROM FF 0-90, abduction 0-60 and ER 0-30 Required Braces or Orthoses: Sling       Mobility Bed Mobility Overal bed mobility: Modified Independent                Transfers Overall transfer level: Modified independent                        ADL either performed or assessed with clinical judgement   ADL Overall ADL's : Needs assistance/impaired                 Upper Body Dressing : Minimal assistance;Sitting   Lower Body Dressing: Minimal assistance;Sit to/from stand   Toilet Transfer: Minimal assistance;Ambulation;Cueing for sequencing;Cueing for safety   Toileting- Clothing Manipulation and Hygiene: Minimal assistance;Sitting/lateral lean;Cueing for sequencing;Cueing for safety                         Cognition Arousal/Alertness: Awake/alert Behavior During Therapy: WFL for tasks assessed/performed Overall Cognitive Status: Within Functional Limits for tasks assessed                                          Exercises     Shoulder Instructions Shoulder Instructions Donning/doffing shirt without moving shoulder: Minimal assistance;Patient able to independently direct caregiver Method for  sponge bathing under operated UE: Minimal assistance;Patient able to independently direct caregiver Donning/doffing sling/immobilizer: Minimal assistance;Patient able to independently direct caregiver Correct positioning of sling/immobilizer: Minimal assistance;Patient able to independently direct caregiver Pendulum exercises (written home exercise program): Minimal assistance;Patient able to independently direct caregiver ROM for elbow, wrist and digits of operated UE: Minimal assistance;Patient able to independently direct caregiver Sling wearing schedule (on at all times/off for ADL's): Independent Proper positioning of operated UE when showering: Minimal assistance;Patient able to independently direct caregiver          Pertinent Vitals/ Pain       Pain Assessment: No/denies pain  Home Living Family/patient expects to be discharged to:: Private residence Living Arrangements: Other relatives Available Help at Discharge: Family Type of Home: House Home Access: Level entry     Home Layout: Two level;Able to live on main level with bedroom/bathroom         Bathroom Toilet: Handicapped height     Home Equipment: Walker - 2 wheels;Shower seat          Prior Functioning/Environment Level of Independence: Independent            Frequency           Progress Toward Goals  OT Goals(current goals can now be found in the care plan section)     Acute Rehab OT  Goals Patient Stated Goal: home OT Goal Formulation: With patient  Plan Discharge plan remains appropriate       AM-PAC OT "6 Clicks" Daily Activity     Outcome Measure   Help from another person eating meals?: A Little Help from another person taking care of personal grooming?: A Little Help from another person toileting, which includes using toliet, bedpan, or urinal?: A Little Help from another person bathing (including washing, rinsing, drying)?: A Little Help from another person to put on and taking  off regular upper body clothing?: A Little Help from another person to put on and taking off regular lower body clothing?: A Little 6 Click Score: 18    End of Session Equipment Utilized During Treatment: Gait belt      Activity Tolerance Patient tolerated treatment well   Patient Left in chair;with call bell/phone within reach   Nurse Communication Mobility status        Time: 1610-9604 OT Time Calculation (min): 19 min  Charges: OT General Charges $OT Visit: 1 Visit OT Evaluation $OT Eval Moderate Complexity: 1 Mod OT Treatments $Self Care/Home Management : 8-22 mins  Tasha Hunt, OT Acute Rehabilitation Services [phone] Office- [phone]      Tasha Hunt, Edwena Felty D 11/18/2019, 12:34 PM

## 2019-11-18 NOTE — Discharge Summary (Signed)
In most cases prophylactic antibiotics for Dental procdeures after total joint surgery are not necessary.  Exceptions are as follows:  1. History of prior total joint infection  2. Severely immunocompromised (Organ Transplant, cancer chemotherapy, Rheumatoid biologic meds such as [place])  3. Poorly controlled diabetes (A1C &gt; 8.0, blood glucose over 200)  If you have one of these conditions, contact your surgeon for an antibiotic prescription, prior to your dental procedure. Orthopedic Discharge Summary        Physician Discharge Summary  Patient ID: Tasha Hunt MRN: [id number] DOB/AGE: 02/24/1945 74 y.o.  Admit date: 11/17/2019 Discharge date: 11/18/2019   Procedures:  Procedure(s) (LRB): REVERSE SHOULDER ARTHROPLASTY (Right)  Attending Physician:  Dr. Esmond Plants  Admission Diagnoses:   Right shoulder end stage OA  Discharge Diagnoses:  same   Past Medical History:  Diagnosis Date  . Allergic rhinitis, cause unspecified   . Aneurysm of splenic artery ([place])    noted since 2002  . Anxiety   . Arthritis    hands  . Asthma   . Complication of anesthesia   . COPD (chronic obstructive pulmonary disease) ([place])   . Depressive disorder, not elsewhere classified   . Esophageal reflux   . H/O hiatal hernia   . Hypertension   . Obesity, unspecified   . Pneumonia    03/27/11 "many many years ago"  . PONV (postoperative nausea and vomiting)     PCP: Velna Hatchet, MD   Discharged Condition: good  Hospital Course:  Patient underwent the above stated procedure on 11/17/2019. Patient tolerated the procedure well and brought to the recovery room in good condition and subsequently to the floor. Patient had an uncomplicated hospital course and was stable for discharge.   Disposition: Discharge disposition: 01-Home or Self Care      with follow up in 2 weeks    Follow-up Information    Netta Cedars, MD. Call in 2 weeks.   Specialty:  Orthopedic Surgery Why: [phone] call for appt Contact information: [address] [place] [address] [phone]               Dental Antibiotics:  In most cases prophylactic antibiotics for Dental procdeures after total joint surgery are not necessary.  Exceptions are as follows:  1. History of prior total joint infection  2. Severely immunocompromised (Organ Transplant, cancer chemotherapy, Rheumatoid biologic meds such as [place])  3. Poorly controlled diabetes (A1C &gt; 8.0, blood glucose over 200)  If you have one of these conditions, contact your surgeon for an antibiotic prescription, prior to your dental procedure.  Discharge Instructions    Call MD / Call 911   Complete by: As directed    If you experience chest pain or shortness of breath, CALL 911 and be transported to the hospital emergency room.  If you develope a fever above 101 F, pus (white drainage) or increased drainage or redness at the wound, or calf pain, call your surgeon's office.   Constipation Prevention   Complete by: As directed    Drink plenty of fluids.  Prune juice may be helpful.  You may use a stool softener, such as Colace (over the counter) 100 mg twice a day.  Use MiraLax (over the counter) for constipation as needed.   Diet - low sodium heart healthy   Complete by: As directed    Increase activity slowly as tolerated   Complete by: As directed       Allergies  as of 11/18/2019      Reactions   Morphine And Related Nausea And Vomiting      Medication List    TAKE these medications   acetaminophen 325 MG tablet Commonly known as: TYLENOL Take 650 mg by mouth every 6 (six) hours as needed for moderate pain or headache.   albuterol 108 (90 Base) MCG/ACT inhaler Commonly known as: VENTOLIN HFA Inhale 1 puff into the lungs every 6 (six) hours as needed for wheezing or shortness of breath.   ALPRAZolam 0.25 MG tablet Commonly known as: XANAX Take 0.25 mg  by mouth 2 (two) times daily as needed for anxiety.   atorvastatin 10 MG tablet Commonly known as: LIPITOR Take 10 mg by mouth daily.   CALCIUM PO Take 1,200 mg by mouth daily.   cetirizine 10 MG tablet Commonly known as: ZYRTEC Take 10 mg by mouth daily.   Dialyvite Vitamin D 5000 125 MCG (5000 UT) capsule Generic drug: Cholecalciferol Take 5,000 Units by mouth daily.   Dilt-XR 180 MG 24 hr capsule Generic drug: diltiazem Take 180 mg by mouth daily.   DULoxetine 60 MG capsule Commonly known as: CYMBALTA Take 60 mg by mouth daily.   Flonase 50 MCG/ACT nasal spray Generic drug: fluticasone Place 2 sprays into the nose daily.   fluticasone 0.05 % cream Commonly known as: CUTIVATE Apply 1 application topically 2 (two) times daily as needed (eczema).   Fluticasone Furoate 100 MCG/ACT Aepb Commonly known as: Arnuity Ellipta Inhale 1 Dose into the lungs daily. What changed: how much to take   hydrochlorothiazide 12.5 MG tablet Commonly known as: HYDRODIURIL Take 12.5 mg by mouth daily.   HYDROcodone-acetaminophen 5-325 MG tablet Commonly known as: NORCO/VICODIN Take 1 tablet by mouth every 6 (six) hours as needed for moderate pain or severe pain. What changed: reasons to take this   ketoconazole 2 % cream Commonly known as: NIZORAL Apply 1 application topically daily as needed (fungus).   meloxicam 15 MG tablet Commonly known as: MOBIC Take 15 mg by mouth daily as needed for pain.   montelukast 10 MG tablet Commonly known as: SINGULAIR Take 10 mg by mouth daily.   OSTEO BI-FLEX ADV TRIPLE ST PO Take 2 tablets by mouth daily.   PROBIOTIC PO Take 1 capsule by mouth daily.   RABEprazole 20 MG tablet Commonly known as: ACIPHEX Take 20 mg by mouth 2 (two) times daily.   Systane 0.4-0.3 % Soln Generic drug: Polyethyl Glycol-Propyl Glycol Place 1 drop into both eyes 4 (four) times daily as needed (for dry eyes).   TART CHERRY PO Take 1,000 mg by mouth  daily.   TURMERIC PO Take 450 mg by mouth daily.   zinc gluconate 50 MG tablet Take 50 mg by mouth daily.         Signed: Augustin Schooling 11/18/2019, 7:09 AM  [facility] is now [facility] [address] [place], [place], [place] [address] Phone: [place]

## 2019-11-18 NOTE — Progress Notes (Signed)
Pt stable at this time. Pt to d/c home with family and home 02. Pt will call [place] when she arrives home. Pt dressing dry and intact at time of d/c.

## 2019-11-20 ENCOUNTER — Encounter (HOSPITAL_COMMUNITY): Payer: Self-pay | Admitting: Orthopedic Surgery

## 2019-11-22 DIAGNOSIS — J9601 Acute respiratory failure with hypoxia: Secondary | ICD-10-CM | POA: Diagnosis not present

## 2019-11-22 DIAGNOSIS — M7551 Bursitis of right shoulder: Secondary | ICD-10-CM | POA: Diagnosis not present

## 2019-11-22 DIAGNOSIS — I10 Essential (primary) hypertension: Secondary | ICD-10-CM | POA: Diagnosis not present

## 2019-11-22 DIAGNOSIS — Z96611 Presence of right artificial shoulder joint: Secondary | ICD-10-CM | POA: Diagnosis not present

## 2019-11-22 DIAGNOSIS — E785 Hyperlipidemia, unspecified: Secondary | ICD-10-CM | POA: Diagnosis not present

## 2019-11-22 DIAGNOSIS — J449 Chronic obstructive pulmonary disease, unspecified: Secondary | ICD-10-CM | POA: Diagnosis not present

## 2019-12-05 DIAGNOSIS — Z4789 Encounter for other orthopedic aftercare: Secondary | ICD-10-CM | POA: Diagnosis not present

## 2019-12-07 DIAGNOSIS — M19011 Primary osteoarthritis, right shoulder: Secondary | ICD-10-CM | POA: Diagnosis not present

## 2019-12-07 DIAGNOSIS — J449 Chronic obstructive pulmonary disease, unspecified: Secondary | ICD-10-CM | POA: Diagnosis not present

## 2019-12-12 DIAGNOSIS — N3001 Acute cystitis with hematuria: Secondary | ICD-10-CM | POA: Diagnosis not present

## 2019-12-20 DIAGNOSIS — J302 Other seasonal allergic rhinitis: Secondary | ICD-10-CM | POA: Diagnosis not present

## 2019-12-20 DIAGNOSIS — E7800 Pure hypercholesterolemia, unspecified: Secondary | ICD-10-CM | POA: Diagnosis not present

## 2019-12-20 DIAGNOSIS — Z79899 Other long term (current) drug therapy: Secondary | ICD-10-CM | POA: Diagnosis not present

## 2019-12-20 DIAGNOSIS — Z87440 Personal history of urinary (tract) infections: Secondary | ICD-10-CM | POA: Diagnosis not present

## 2019-12-20 DIAGNOSIS — F3341 Major depressive disorder, recurrent, in partial remission: Secondary | ICD-10-CM | POA: Diagnosis not present

## 2019-12-20 DIAGNOSIS — Z6833 Body mass index (BMI) 33.0-33.9, adult: Secondary | ICD-10-CM | POA: Diagnosis not present

## 2019-12-20 DIAGNOSIS — I10 Essential (primary) hypertension: Secondary | ICD-10-CM | POA: Diagnosis not present

## 2019-12-20 DIAGNOSIS — E669 Obesity, unspecified: Secondary | ICD-10-CM | POA: Diagnosis not present

## 2019-12-20 DIAGNOSIS — M1991 Primary osteoarthritis, unspecified site: Secondary | ICD-10-CM | POA: Diagnosis not present

## 2019-12-20 DIAGNOSIS — J4540 Moderate persistent asthma, uncomplicated: Secondary | ICD-10-CM | POA: Diagnosis not present

## 2019-12-20 DIAGNOSIS — F419 Anxiety disorder, unspecified: Secondary | ICD-10-CM | POA: Diagnosis not present

## 2019-12-20 DIAGNOSIS — L309 Dermatitis, unspecified: Secondary | ICD-10-CM | POA: Diagnosis not present

## 2019-12-21 DIAGNOSIS — Z01419 Encounter for gynecological examination (general) (routine) without abnormal findings: Secondary | ICD-10-CM | POA: Diagnosis not present

## 2019-12-21 DIAGNOSIS — Z6833 Body mass index (BMI) 33.0-33.9, adult: Secondary | ICD-10-CM | POA: Diagnosis not present

## 2019-12-21 DIAGNOSIS — N62 Hypertrophy of breast: Secondary | ICD-10-CM | POA: Diagnosis not present

## 2020-01-02 DIAGNOSIS — Z4789 Encounter for other orthopedic aftercare: Secondary | ICD-10-CM | POA: Diagnosis not present

## 2020-01-03 DIAGNOSIS — R634 Abnormal weight loss: Secondary | ICD-10-CM | POA: Diagnosis not present

## 2020-01-03 DIAGNOSIS — K921 Melena: Secondary | ICD-10-CM | POA: Diagnosis not present

## 2020-01-03 DIAGNOSIS — R1314 Dysphagia, pharyngoesophageal phase: Secondary | ICD-10-CM | POA: Diagnosis not present

## 2020-01-03 DIAGNOSIS — D649 Anemia, unspecified: Secondary | ICD-10-CM | POA: Diagnosis not present

## 2020-01-03 DIAGNOSIS — R195 Other fecal abnormalities: Secondary | ICD-10-CM | POA: Diagnosis not present

## 2020-01-04 DIAGNOSIS — D72829 Elevated white blood cell count, unspecified: Secondary | ICD-10-CM | POA: Diagnosis not present

## 2020-01-05 DIAGNOSIS — R634 Abnormal weight loss: Secondary | ICD-10-CM | POA: Diagnosis not present

## 2020-01-05 DIAGNOSIS — K2970 Gastritis, unspecified, without bleeding: Secondary | ICD-10-CM | POA: Diagnosis not present

## 2020-01-05 DIAGNOSIS — K317 Polyp of stomach and duodenum: Secondary | ICD-10-CM | POA: Diagnosis not present

## 2020-01-05 DIAGNOSIS — K209 Esophagitis, unspecified without bleeding: Secondary | ICD-10-CM | POA: Diagnosis not present

## 2020-01-05 DIAGNOSIS — D649 Anemia, unspecified: Secondary | ICD-10-CM | POA: Diagnosis not present

## 2020-01-05 DIAGNOSIS — K222 Esophageal obstruction: Secondary | ICD-10-CM | POA: Diagnosis not present

## 2020-01-06 DIAGNOSIS — J449 Chronic obstructive pulmonary disease, unspecified: Secondary | ICD-10-CM | POA: Diagnosis not present

## 2020-01-06 DIAGNOSIS — M19011 Primary osteoarthritis, right shoulder: Secondary | ICD-10-CM | POA: Diagnosis not present

## 2020-01-16 ENCOUNTER — Other Ambulatory Visit: Payer: Self-pay | Admitting: Surgery

## 2020-01-16 DIAGNOSIS — Z9189 Other specified personal risk factors, not elsewhere classified: Secondary | ICD-10-CM

## 2020-01-29 DIAGNOSIS — R634 Abnormal weight loss: Secondary | ICD-10-CM | POA: Diagnosis not present

## 2020-01-29 DIAGNOSIS — I700 Atherosclerosis of aorta: Secondary | ICD-10-CM | POA: Diagnosis not present

## 2020-01-29 DIAGNOSIS — K529 Noninfective gastroenteritis and colitis, unspecified: Secondary | ICD-10-CM | POA: Diagnosis not present

## 2020-01-29 DIAGNOSIS — R590 Localized enlarged lymph nodes: Secondary | ICD-10-CM | POA: Diagnosis not present

## 2020-02-07 ENCOUNTER — Ambulatory Visit
Admission: RE | Admit: 2020-02-07 | Discharge: 2020-02-07 | Disposition: A | Discharge status: Home / Self Care | Payer: PPO | Source: Ambulatory Visit | Attending: Surgery | Admitting: Surgery

## 2020-02-07 ENCOUNTER — Other Ambulatory Visit: Payer: Self-pay

## 2020-02-07 DIAGNOSIS — Z853 Personal history of malignant neoplasm of breast: Secondary | ICD-10-CM | POA: Diagnosis not present

## 2020-02-07 DIAGNOSIS — Z9189 Other specified personal risk factors, not elsewhere classified: Secondary | ICD-10-CM

## 2020-02-07 MED ORDER — GADOBUTROL 1 MMOL/ML IV SOLN
7.0000 mL | Freq: Once | INTRAVENOUS | Status: AC | PRN
  Administered 2020-02-07: 7 mL via INTRAVENOUS

## 2020-02-10 ENCOUNTER — Other Ambulatory Visit: Payer: PPO

## 2020-02-20 DIAGNOSIS — Z4789 Encounter for other orthopedic aftercare: Secondary | ICD-10-CM | POA: Diagnosis not present

## 2020-02-22 DIAGNOSIS — K5730 Diverticulosis of large intestine without perforation or abscess without bleeding: Secondary | ICD-10-CM | POA: Diagnosis not present

## 2020-02-22 DIAGNOSIS — K5180 Other ulcerative colitis without complications: Secondary | ICD-10-CM | POA: Diagnosis not present

## 2020-02-22 DIAGNOSIS — K5150 Left sided colitis without complications: Secondary | ICD-10-CM | POA: Diagnosis not present

## 2020-02-22 DIAGNOSIS — K5190 Ulcerative colitis, unspecified, without complications: Secondary | ICD-10-CM | POA: Diagnosis not present

## 2020-02-22 DIAGNOSIS — R935 Abnormal findings on diagnostic imaging of other abdominal regions, including retroperitoneum: Secondary | ICD-10-CM | POA: Diagnosis not present

## 2020-02-22 DIAGNOSIS — K51211 Ulcerative (chronic) proctitis with rectal bleeding: Secondary | ICD-10-CM | POA: Diagnosis not present

## 2020-02-22 DIAGNOSIS — K648 Other hemorrhoids: Secondary | ICD-10-CM | POA: Diagnosis not present

## 2020-02-22 DIAGNOSIS — K642 Third degree hemorrhoids: Secondary | ICD-10-CM | POA: Diagnosis not present

## 2020-02-26 DIAGNOSIS — K5150 Left sided colitis without complications: Secondary | ICD-10-CM | POA: Diagnosis not present

## 2020-04-11 DIAGNOSIS — Z8719 Personal history of other diseases of the digestive system: Secondary | ICD-10-CM | POA: Diagnosis not present

## 2020-04-11 DIAGNOSIS — M549 Dorsalgia, unspecified: Secondary | ICD-10-CM | POA: Diagnosis not present

## 2020-04-11 DIAGNOSIS — R300 Dysuria: Secondary | ICD-10-CM | POA: Diagnosis not present

## 2020-04-19 DIAGNOSIS — M1990 Unspecified osteoarthritis, unspecified site: Secondary | ICD-10-CM | POA: Diagnosis not present

## 2020-04-19 DIAGNOSIS — K5150 Left sided colitis without complications: Secondary | ICD-10-CM | POA: Diagnosis not present

## 2020-04-19 DIAGNOSIS — K5120 Ulcerative (chronic) proctitis without complications: Secondary | ICD-10-CM | POA: Diagnosis not present

## 2020-04-24 DIAGNOSIS — H35033 Hypertensive retinopathy, bilateral: Secondary | ICD-10-CM | POA: Diagnosis not present

## 2020-04-24 DIAGNOSIS — H43813 Vitreous degeneration, bilateral: Secondary | ICD-10-CM | POA: Diagnosis not present

## 2020-04-24 DIAGNOSIS — Z961 Presence of intraocular lens: Secondary | ICD-10-CM | POA: Diagnosis not present

## 2020-04-24 DIAGNOSIS — H52223 Regular astigmatism, bilateral: Secondary | ICD-10-CM | POA: Diagnosis not present

## 2020-04-24 DIAGNOSIS — D3131 Benign neoplasm of right choroid: Secondary | ICD-10-CM | POA: Diagnosis not present

## 2020-04-24 DIAGNOSIS — H524 Presbyopia: Secondary | ICD-10-CM | POA: Diagnosis not present

## 2020-05-13 ENCOUNTER — Other Ambulatory Visit: Payer: Self-pay | Admitting: Family Medicine

## 2020-05-13 DIAGNOSIS — Z1231 Encounter for screening mammogram for malignant neoplasm of breast: Secondary | ICD-10-CM

## 2020-05-13 DIAGNOSIS — N6091 Unspecified benign mammary dysplasia of right breast: Secondary | ICD-10-CM | POA: Diagnosis not present

## 2020-05-13 DIAGNOSIS — Z9189 Other specified personal risk factors, not elsewhere classified: Secondary | ICD-10-CM | POA: Diagnosis not present

## 2020-06-03 DIAGNOSIS — J441 Chronic obstructive pulmonary disease with (acute) exacerbation: Secondary | ICD-10-CM | POA: Diagnosis not present

## 2020-06-03 DIAGNOSIS — R059 Cough, unspecified: Secondary | ICD-10-CM | POA: Diagnosis not present

## 2020-06-28 DIAGNOSIS — K5190 Ulcerative colitis, unspecified, without complications: Secondary | ICD-10-CM | POA: Diagnosis not present

## 2020-06-28 DIAGNOSIS — Z0000 Encounter for general adult medical examination without abnormal findings: Secondary | ICD-10-CM | POA: Diagnosis not present

## 2020-07-05 DIAGNOSIS — K5120 Ulcerative (chronic) proctitis without complications: Secondary | ICD-10-CM | POA: Diagnosis not present

## 2020-07-25 ENCOUNTER — Other Ambulatory Visit: Payer: Self-pay

## 2020-07-25 ENCOUNTER — Ambulatory Visit
Admission: RE | Admit: 2020-07-25 | Discharge: 2020-07-25 | Disposition: A | Discharge status: Home / Self Care | Payer: PPO | Source: Ambulatory Visit | Attending: Family Medicine | Admitting: Family Medicine

## 2020-07-25 DIAGNOSIS — Z1231 Encounter for screening mammogram for malignant neoplasm of breast: Secondary | ICD-10-CM | POA: Diagnosis not present

## 2020-07-29 ENCOUNTER — Encounter (HOSPITAL_COMMUNITY): Payer: Self-pay

## 2020-08-16 DIAGNOSIS — Z96651 Presence of right artificial knee joint: Secondary | ICD-10-CM | POA: Diagnosis not present

## 2020-08-16 DIAGNOSIS — M25561 Pain in right knee: Secondary | ICD-10-CM | POA: Diagnosis not present

## 2020-08-19 DIAGNOSIS — N958 Other specified menopausal and perimenopausal disorders: Secondary | ICD-10-CM | POA: Diagnosis not present

## 2020-08-22 DIAGNOSIS — D2272 Melanocytic nevi of left lower limb, including hip: Secondary | ICD-10-CM | POA: Diagnosis not present

## 2020-08-22 DIAGNOSIS — L821 Other seborrheic keratosis: Secondary | ICD-10-CM | POA: Diagnosis not present

## 2020-08-22 DIAGNOSIS — L814 Other melanin hyperpigmentation: Secondary | ICD-10-CM | POA: Diagnosis not present

## 2020-08-22 DIAGNOSIS — L84 Corns and callosities: Secondary | ICD-10-CM | POA: Diagnosis not present

## 2020-08-22 DIAGNOSIS — X32XXXS Exposure to sunlight, sequela: Secondary | ICD-10-CM | POA: Diagnosis not present

## 2020-09-11 DIAGNOSIS — M545 Low back pain, unspecified: Secondary | ICD-10-CM | POA: Diagnosis not present

## 2020-10-04 ENCOUNTER — Encounter (INDEPENDENT_AMBULATORY_CARE_PROVIDER_SITE_OTHER): Payer: PPO | Admitting: Ophthalmology

## 2020-10-04 ENCOUNTER — Other Ambulatory Visit: Payer: Self-pay

## 2020-10-04 DIAGNOSIS — D3131 Benign neoplasm of right choroid: Secondary | ICD-10-CM

## 2020-10-04 DIAGNOSIS — H43813 Vitreous degeneration, bilateral: Secondary | ICD-10-CM

## 2020-10-04 DIAGNOSIS — H35033 Hypertensive retinopathy, bilateral: Secondary | ICD-10-CM | POA: Diagnosis not present

## 2020-10-04 DIAGNOSIS — I10 Essential (primary) hypertension: Secondary | ICD-10-CM | POA: Diagnosis not present

## 2020-10-07 DIAGNOSIS — K5130 Ulcerative (chronic) rectosigmoiditis without complications: Secondary | ICD-10-CM | POA: Diagnosis not present

## 2020-10-07 DIAGNOSIS — K219 Gastro-esophageal reflux disease without esophagitis: Secondary | ICD-10-CM | POA: Diagnosis not present

## 2020-10-07 DIAGNOSIS — R1013 Epigastric pain: Secondary | ICD-10-CM | POA: Diagnosis not present

## 2020-10-17 DIAGNOSIS — M5416 Radiculopathy, lumbar region: Secondary | ICD-10-CM | POA: Diagnosis not present

## 2020-11-06 DIAGNOSIS — M4126 Other idiopathic scoliosis, lumbar region: Secondary | ICD-10-CM | POA: Diagnosis not present

## 2020-11-06 DIAGNOSIS — M545 Low back pain, unspecified: Secondary | ICD-10-CM | POA: Diagnosis not present

## 2020-11-07 DIAGNOSIS — J45901 Unspecified asthma with (acute) exacerbation: Secondary | ICD-10-CM | POA: Diagnosis not present

## 2020-11-07 DIAGNOSIS — R490 Dysphonia: Secondary | ICD-10-CM | POA: Diagnosis not present

## 2020-11-07 DIAGNOSIS — J029 Acute pharyngitis, unspecified: Secondary | ICD-10-CM | POA: Diagnosis not present

## 2020-11-07 DIAGNOSIS — Z20822 Contact with and (suspected) exposure to covid-19: Secondary | ICD-10-CM | POA: Diagnosis not present

## 2020-11-07 DIAGNOSIS — R059 Cough, unspecified: Secondary | ICD-10-CM | POA: Diagnosis not present

## 2020-11-07 DIAGNOSIS — J040 Acute laryngitis: Secondary | ICD-10-CM | POA: Diagnosis not present

## 2020-11-14 DIAGNOSIS — K5130 Ulcerative (chronic) rectosigmoiditis without complications: Secondary | ICD-10-CM | POA: Diagnosis not present

## 2020-11-14 DIAGNOSIS — K219 Gastro-esophageal reflux disease without esophagitis: Secondary | ICD-10-CM | POA: Diagnosis not present

## 2020-11-17 DIAGNOSIS — M5451 Vertebrogenic low back pain: Secondary | ICD-10-CM | POA: Diagnosis not present

## 2020-11-19 DIAGNOSIS — N3000 Acute cystitis without hematuria: Secondary | ICD-10-CM | POA: Diagnosis not present

## 2020-11-19 DIAGNOSIS — Z6834 Body mass index (BMI) 34.0-34.9, adult: Secondary | ICD-10-CM | POA: Diagnosis not present

## 2020-11-19 DIAGNOSIS — R3915 Urgency of urination: Secondary | ICD-10-CM | POA: Diagnosis not present

## 2020-11-20 DIAGNOSIS — R300 Dysuria: Secondary | ICD-10-CM | POA: Diagnosis not present

## 2020-11-20 DIAGNOSIS — R319 Hematuria, unspecified: Secondary | ICD-10-CM | POA: Diagnosis not present

## 2020-11-20 DIAGNOSIS — N390 Urinary tract infection, site not specified: Secondary | ICD-10-CM | POA: Diagnosis not present

## 2020-11-26 DIAGNOSIS — M545 Low back pain, unspecified: Secondary | ICD-10-CM | POA: Diagnosis not present

## 2020-11-26 DIAGNOSIS — M4800 Spinal stenosis, site unspecified: Secondary | ICD-10-CM | POA: Diagnosis not present

## 2020-12-24 DIAGNOSIS — Z124 Encounter for screening for malignant neoplasm of cervix: Secondary | ICD-10-CM | POA: Diagnosis not present

## 2020-12-24 DIAGNOSIS — Z853 Personal history of malignant neoplasm of breast: Secondary | ICD-10-CM | POA: Diagnosis not present

## 2020-12-24 DIAGNOSIS — Z90710 Acquired absence of both cervix and uterus: Secondary | ICD-10-CM | POA: Diagnosis not present

## 2020-12-24 DIAGNOSIS — Z779 Other contact with and (suspected) exposures hazardous to health: Secondary | ICD-10-CM | POA: Diagnosis not present

## 2020-12-24 DIAGNOSIS — Z1272 Encounter for screening for malignant neoplasm of vagina: Secondary | ICD-10-CM | POA: Diagnosis not present

## 2020-12-24 DIAGNOSIS — Z6834 Body mass index (BMI) 34.0-34.9, adult: Secondary | ICD-10-CM | POA: Diagnosis not present

## 2020-12-25 DIAGNOSIS — R300 Dysuria: Secondary | ICD-10-CM | POA: Diagnosis not present

## 2020-12-25 DIAGNOSIS — I10 Essential (primary) hypertension: Secondary | ICD-10-CM | POA: Diagnosis not present

## 2021-01-10 ENCOUNTER — Other Ambulatory Visit: Payer: Self-pay | Admitting: Physician Assistant

## 2021-01-10 ENCOUNTER — Other Ambulatory Visit (HOSPITAL_COMMUNITY): Payer: Self-pay | Admitting: Physician Assistant

## 2021-01-10 DIAGNOSIS — M79604 Pain in right leg: Secondary | ICD-10-CM | POA: Diagnosis not present

## 2021-01-10 DIAGNOSIS — M79605 Pain in left leg: Secondary | ICD-10-CM

## 2021-01-29 ENCOUNTER — Other Ambulatory Visit: Payer: Self-pay

## 2021-01-29 ENCOUNTER — Ambulatory Visit (HOSPITAL_COMMUNITY)
Admission: RE | Admit: 2021-01-29 | Discharge: 2021-01-29 | Disposition: A | Discharge status: Home / Self Care | Payer: PPO | Source: Ambulatory Visit | Attending: Physician Assistant | Admitting: Physician Assistant

## 2021-01-29 ENCOUNTER — Encounter (HOSPITAL_COMMUNITY)
Admission: RE | Admit: 2021-01-29 | Discharge: 2021-01-29 | Disposition: A | Discharge status: Home / Self Care | Payer: PPO | Source: Ambulatory Visit | Attending: Physician Assistant | Admitting: Physician Assistant

## 2021-01-29 DIAGNOSIS — M79604 Pain in right leg: Secondary | ICD-10-CM

## 2021-01-29 DIAGNOSIS — M79605 Pain in left leg: Secondary | ICD-10-CM | POA: Diagnosis present

## 2021-01-29 MED ORDER — TECHNETIUM TC 99M MEDRONATE IV KIT
20.0000 | PACK | Freq: Once | INTRAVENOUS | Status: AC
  Administered 2021-01-29: 20 via INTRAVENOUS

## 2021-02-10 ENCOUNTER — Other Ambulatory Visit: Payer: Self-pay | Admitting: Surgery

## 2021-02-10 DIAGNOSIS — Z1239 Encounter for other screening for malignant neoplasm of breast: Secondary | ICD-10-CM

## 2021-02-23 ENCOUNTER — Ambulatory Visit
Admission: RE | Admit: 2021-02-23 | Discharge: 2021-02-23 | Disposition: A | Discharge status: Home / Self Care | Payer: PPO | Source: Ambulatory Visit | Attending: Surgery | Admitting: Surgery

## 2021-02-23 ENCOUNTER — Other Ambulatory Visit: Payer: Self-pay

## 2021-02-23 DIAGNOSIS — Z1239 Encounter for other screening for malignant neoplasm of breast: Secondary | ICD-10-CM

## 2021-02-23 MED ORDER — GADOBUTROL 1 MMOL/ML IV SOLN
9.0000 mL | Freq: Once | INTRAVENOUS | Status: AC | PRN
  Administered 2021-02-23: 8 mL via INTRAVENOUS

## 2021-02-25 ENCOUNTER — Other Ambulatory Visit: Payer: Self-pay | Admitting: Surgery

## 2021-02-25 DIAGNOSIS — R9389 Abnormal findings on diagnostic imaging of other specified body structures: Secondary | ICD-10-CM

## 2021-03-05 ENCOUNTER — Ambulatory Visit
Admission: RE | Admit: 2021-03-05 | Discharge: 2021-03-05 | Disposition: A | Discharge status: Home / Self Care | Payer: PPO | Source: Ambulatory Visit | Attending: Surgery | Admitting: Surgery

## 2021-03-05 ENCOUNTER — Other Ambulatory Visit: Payer: Self-pay

## 2021-03-05 DIAGNOSIS — R9389 Abnormal findings on diagnostic imaging of other specified body structures: Secondary | ICD-10-CM

## 2021-06-19 HISTORY — PX: TOTAL KNEE ARTHROPLASTY: SHX125

## 2021-07-08 ENCOUNTER — Other Ambulatory Visit: Payer: Self-pay | Admitting: Physician Assistant

## 2021-07-08 DIAGNOSIS — Z1231 Encounter for screening mammogram for malignant neoplasm of breast: Secondary | ICD-10-CM

## 2021-07-31 ENCOUNTER — Ambulatory Visit
Admission: RE | Admit: 2021-07-31 | Discharge: 2021-07-31 | Disposition: A | Discharge status: Home / Self Care | Payer: PPO | Source: Ambulatory Visit | Attending: Physician Assistant | Admitting: Physician Assistant

## 2021-07-31 DIAGNOSIS — Z1231 Encounter for screening mammogram for malignant neoplasm of breast: Secondary | ICD-10-CM

## 2021-10-07 ENCOUNTER — Encounter (INDEPENDENT_AMBULATORY_CARE_PROVIDER_SITE_OTHER): Payer: PPO | Admitting: Ophthalmology

## 2021-10-20 NOTE — Patient Instructions (Signed)
DUE TO COVID-19 ONLY TWO VISITORS  (aged 77 and older)  ARE ALLOWED TO COME WITH YOU AND STAY IN THE WAITING ROOM ONLY DURING PRE OP AND PROCEDURE.   **NO VISITORS ARE ALLOWED IN THE SHORT STAY AREA OR RECOVERY ROOM!!**  IF YOU WILL BE ADMITTED INTO THE HOSPITAL YOU ARE ALLOWED ONLY FOUR SUPPORT PEOPLE DURING VISITATION HOURS ONLY (7 AM -8PM)   The support person(s) must pass our screening, gel in and out, and wear a mask at all times, including in the patient's room. Patients must also wear a mask when staff or their support person are in the room. Visitors GUEST BADGE MUST BE WORN VISIBLY  One adult visitor may remain with you overnight and MUST be in the room by 8 P.M.     Your procedure is scheduled on: 10/24/21   Report to [facility] Main Entrance    Report to admitting at : 11:15 AM   Call this number if you have problems the morning of surgery [phone]   Do not eat food :After Midnight.   After Midnight you may have the following liquids until: 10:30 AM DAY OF SURGERY  Water Black Coffee (sugar ok, NO MILK/CREAM OR CREAMERS)  Tea (sugar ok, NO MILK/CREAM OR CREAMERS) regular and decaf                             Plain Jell-O (NO RED)                                           Fruit ices (not with fruit pulp, NO RED)                                     Popsicles (NO RED)                                                                  Juice: apple, WHITE grape, WHITE cranberry Sports drinks like Gatorade (NO RED)  Drink  Ensure drink AT: 10:30 AM the day of surgery.    The day of surgery:  Drink ONE (1) Pre-Surgery Clear Ensure or G2 at AM the morning of surgery. Drink in one sitting. Do not sip.  This drink was given to you during your hospital  pre-op appointment visit. Nothing else to drink after completing the  Pre-Surgery Clear Ensure or G2.          If you have questions, please contact your surgeon's office.   Oral Hygiene is also important to  reduce your risk of infection.                                    Remember - BRUSH YOUR TEETH THE MORNING OF SURGERY WITH YOUR REGULAR TOOTHPASTE   Do NOT smoke after Midnight   Take these medicines the morning of surgery with A SIP OF WATER: duloxetine,cetirizine,DILT-XR,sulfa salazine.Use inhalers and Flonase as usual.Hydroxyzine,alprazolam and tylenol as usual.  DO NOT TAKE ANY ORAL DIABETIC MEDICATIONS DAY OF YOUR SURGERY                              You may not have any metal on your body including hair pins, jewelry, and body piercing             Do not wear make-up, lotions, powders, perfumes/cologne, or deodorant  Do not wear nail polish including gel and S&S, artificial/acrylic nails, or any other type of covering on natural nails including finger and toenails. If you have artificial nails, gel coating, etc. that needs to be removed by a nail salon please have this removed prior to surgery or surgery may need to be canceled/ delayed if the surgeon/ anesthesia feels like they are unable to be safely monitored.   Do not shave  48 hours prior to surgery.    Do not bring valuables to the hospital. [place].   Contacts, dentures or bridgework may not be worn into surgery.   Bring small overnight bag day of surgery.   DO NOT [place]. PHARMACY WILL DISPENSE MEDICATIONS LISTED ON YOUR MEDICATION LIST TO YOU DURING YOUR ADMISSION [place]!    Patients discharged on the day of surgery will not be allowed to drive home.  Someone NEEDS to stay with you for the first 24 hours after anesthesia.   Special Instructions: Bring a copy of your healthcare power of attorney and living will documents         the day of surgery if you haven't scanned them before.              Please read over the following fact sheets you were given: IF YOU HAVE QUESTIONS ABOUT YOUR PRE-OP INSTRUCTIONS PLEASE CALL  [phone]     [facility] - Preparing for Surgery Before surgery, you can play an important role.  Because skin is not sterile, your skin needs to be as free of germs as possible.  You can reduce the number of germs on your skin by washing with CHG (chlorahexidine gluconate) soap before surgery.  CHG is an antiseptic cleaner which kills germs and bonds with the skin to continue killing germs even after washing. Please DO NOT use if you have an allergy to CHG or antibacterial soaps.  If your skin becomes reddened/irritated stop using the CHG and inform your nurse when you arrive at Short Stay. Do not shave (including legs and underarms) for at least 48 hours prior to the first CHG shower.  You may shave your face/neck. Please follow these instructions carefully:  1.  Shower with CHG Soap the night before surgery and the  morning of Surgery.  2.  If you choose to wash your hair, wash your hair first as usual with your  normal  shampoo.  3.  After you shampoo, rinse your hair and body thoroughly to remove the  shampoo.                           4.  Use CHG as you would any other liquid soap.  You can apply chg directly  to the skin and wash  Gently with a scrungie or clean washcloth.  5.  Apply the CHG Soap to your body ONLY FROM THE NECK DOWN.   Do not use on face/ open                           Wound or open sores. Avoid contact with eyes, ears mouth and genitals (private parts).                       Wash face,  Genitals (private parts) with your normal soap.             6.  Wash thoroughly, paying special attention to the area where your surgery  will be performed.  7.  Thoroughly rinse your body with warm water from the neck down.  8.  DO NOT shower/wash with your normal soap after using and rinsing off  the CHG Soap.                9.  Pat yourself dry with a clean towel.            10.  Wear clean pajamas.            11.  Place clean sheets on your bed the night of your  first shower and do not  sleep with pets. Day of Surgery : Do not apply any lotions/deodorants the morning of surgery.  Please wear clean clothes to the hospital/surgery center.  FAILURE TO FOLLOW THESE INSTRUCTIONS MAY RESULT IN THE CANCELLATION OF YOUR SURGERY PATIENT SIGNATURE_________________________________  NURSE SIGNATURE__________________________________  ________________________________________________________________________   Adam Phenix  An incentive spirometer is a tool that can help keep your lungs clear and active. This tool measures how well you are filling your lungs with each breath. Taking long deep breaths may help reverse or decrease the chance of developing breathing (pulmonary) problems (especially infection) following: A long period of time when you are unable to move or be active. BEFORE THE PROCEDURE  If the spirometer includes an indicator to show your best effort, your nurse or respiratory therapist will set it to a desired goal. If possible, sit up straight or lean slightly forward. Try not to slouch. Hold the incentive spirometer in an upright position. INSTRUCTIONS FOR USE  Sit on the edge of your bed if possible, or sit up as far as you can in bed or on a chair. Hold the incentive spirometer in an upright position. Breathe out normally. Place the mouthpiece in your mouth and seal your lips tightly around it. Breathe in slowly and as deeply as possible, raising the piston or the ball toward the top of the column. Hold your breath for 3-5 seconds or for as long as possible. Allow the piston or ball to fall to the bottom of the column. Remove the mouthpiece from your mouth and breathe out normally. Rest for a few seconds and repeat Steps 1 through 7 at least 10 times every 1-2 hours when you are awake. Take your time and take a few normal breaths between deep breaths. The spirometer may include an indicator to show your best effort. Use the indicator  as a goal to work toward during each repetition. After each set of 10 deep breaths, practice coughing to be sure your lungs are clear. If you have an incision (the cut made at the time of surgery), support your incision when coughing by placing a pillow or rolled up towels  firmly against it. Once you are able to get out of bed, walk around indoors and cough well. You may stop using the incentive spirometer when instructed by your caregiver.  RISKS AND COMPLICATIONS Take your time so you do not get dizzy or light-headed. If you are in pain, you may need to take or ask for pain medication before doing incentive spirometry. It is harder to take a deep breath if you are having pain. AFTER USE Rest and breathe slowly and easily. It can be helpful to keep track of a log of your progress. Your caregiver can provide you with a simple table to help with this. If you are using the spirometer at home, follow these instructions: [place] IF:  You are having difficultly using the spirometer. You have trouble using the spirometer as often as instructed. Your pain medication is not giving enough relief while using the spirometer. You develop fever of 100.5 F (38.1 C) or higher. SEEK IMMEDIATE MEDICAL CARE IF:  You cough up bloody sputum that had not been present before. You develop fever of 102 F (38.9 C) or greater. You develop worsening pain at or near the incision site. MAKE SURE YOU:  Understand these instructions. Will watch your condition. Will get help right away if you are not doing well or get worse. Document Released: 05/18/2006 Document Revised: 03/30/2011 Document Reviewed: 07/19/2006 [facility] Patient Information 2014 [place], [place].   ________________________________________________________________________

## 2021-10-21 ENCOUNTER — Encounter (HOSPITAL_COMMUNITY): Payer: Self-pay

## 2021-10-21 ENCOUNTER — Other Ambulatory Visit: Payer: Self-pay

## 2021-10-21 ENCOUNTER — Encounter (HOSPITAL_COMMUNITY)
Admission: RE | Admit: 2021-10-21 | Discharge: 2021-10-21 | Disposition: A | Discharge status: Home / Self Care | Payer: PPO | Source: Ambulatory Visit | Attending: Orthopedic Surgery | Admitting: Orthopedic Surgery

## 2021-10-21 DIAGNOSIS — Z01818 Encounter for other preprocedural examination: Secondary | ICD-10-CM | POA: Insufficient documentation

## 2021-10-21 HISTORY — DX: Ulcerative colitis, unspecified, without complications: K51.90

## 2021-10-21 NOTE — Progress Notes (Addendum)
For Short Stay: [place] appointment date: Date of COVID positive in last 35 days:  Bowel Prep reminder:   For Anesthesia: PCP - Rulon Abide: [facility] Cardiologist - Dr. Lanora Manis  Chest x-ray -  EKG - 09/26/21: CEW: requested/ Chart Stress Test -  ECHO -  Cardiac Cath -  Pacemaker/ICD device last checked: Pacemaker orders received: Device Rep notified:  Spinal Cord Stimulator:  Sleep Study -  CPAP -   Fasting Blood Sugar -  Checks Blood Sugar _____ times a day Date and result of last Hgb A1c-  Blood Thinner Instructions: Aspirin Instructions: Last Dose:  Activity level: Can go up a flight of stairs and activities of daily living without stopping and without chest pain and/or shortness of breath   Able to exercise without chest pain and/or shortness of breath   Unable to go up a flight of stairs without chest pain and/or shortness of breath     Anesthesia review: Hx: COPD,HTN  Patient denies shortness of breath, fever, cough and chest pain at PAT appointment   Patient verbalized understanding of instructions that were given to them at the PAT appointment. Patient was also instructed that they will need to review over the PAT instructions again at home before surgery.

## 2021-10-24 ENCOUNTER — Ambulatory Visit (HOSPITAL_COMMUNITY)
Admission: RE | Admit: 2021-10-24 | Discharge: 2021-10-24 | Disposition: A | Discharge status: Home / Self Care | Payer: PPO | Attending: Orthopedic Surgery | Admitting: Orthopedic Surgery

## 2021-10-24 ENCOUNTER — Encounter (HOSPITAL_COMMUNITY): Payer: Self-pay | Admitting: Orthopedic Surgery

## 2021-10-24 ENCOUNTER — Ambulatory Visit (HOSPITAL_BASED_OUTPATIENT_CLINIC_OR_DEPARTMENT_OTHER): Payer: PPO | Admitting: Physician Assistant

## 2021-10-24 ENCOUNTER — Ambulatory Visit (HOSPITAL_COMMUNITY): Payer: PPO | Admitting: Physician Assistant

## 2021-10-24 ENCOUNTER — Encounter (HOSPITAL_COMMUNITY)
Admission: RE | Disposition: A | Discharge status: Home / Self Care | Payer: Self-pay | Source: Home / Self Care | Attending: Orthopedic Surgery

## 2021-10-24 DIAGNOSIS — M1990 Unspecified osteoarthritis, unspecified site: Secondary | ICD-10-CM | POA: Insufficient documentation

## 2021-10-24 DIAGNOSIS — F418 Other specified anxiety disorders: Secondary | ICD-10-CM

## 2021-10-24 DIAGNOSIS — T84093A Other mechanical complication of internal left knee prosthesis, initial encounter: Secondary | ICD-10-CM | POA: Diagnosis not present

## 2021-10-24 DIAGNOSIS — T8484XA Pain due to internal orthopedic prosthetic devices, implants and grafts, initial encounter: Secondary | ICD-10-CM | POA: Diagnosis not present

## 2021-10-24 DIAGNOSIS — T8482XA Fibrosis due to internal orthopedic prosthetic devices, implants and grafts, initial encounter: Secondary | ICD-10-CM | POA: Diagnosis not present

## 2021-10-24 DIAGNOSIS — I10 Essential (primary) hypertension: Secondary | ICD-10-CM | POA: Insufficient documentation

## 2021-10-24 DIAGNOSIS — J449 Chronic obstructive pulmonary disease, unspecified: Secondary | ICD-10-CM | POA: Insufficient documentation

## 2021-10-24 DIAGNOSIS — Z79899 Other long term (current) drug therapy: Secondary | ICD-10-CM | POA: Insufficient documentation

## 2021-10-24 DIAGNOSIS — F419 Anxiety disorder, unspecified: Secondary | ICD-10-CM | POA: Diagnosis not present

## 2021-10-24 DIAGNOSIS — Z7951 Long term (current) use of inhaled steroids: Secondary | ICD-10-CM | POA: Insufficient documentation

## 2021-10-24 DIAGNOSIS — F32A Depression, unspecified: Secondary | ICD-10-CM | POA: Insufficient documentation

## 2021-10-24 DIAGNOSIS — M24662 Ankylosis, left knee: Secondary | ICD-10-CM | POA: Insufficient documentation

## 2021-10-24 HISTORY — PX: KNEE CLOSED REDUCTION: SHX995

## 2021-10-24 HISTORY — DX: Dyspnea, unspecified: R06.00

## 2021-10-24 SURGERY — MANIPULATION, KNEE, CLOSED
Anesthesia: Regional | Site: Knee | Laterality: Left

## 2021-10-24 MED ORDER — OXYCODONE HCL 5 MG PO TABS: 5.0000 mg | ORAL_TABLET | Freq: Once | ORAL | Status: DC | PRN

## 2021-10-24 MED ORDER — HYDROCODONE-ACETAMINOPHEN 5-325 MG PO TABS: 1.0000 | ORAL_TABLET | Freq: Four times a day (QID) | ORAL | 0 refills | Status: DC | PRN

## 2021-10-24 MED ORDER — OXYCODONE HCL 5 MG/5ML PO SOLN: 5.0000 mg | Freq: Once | ORAL | Status: DC | PRN

## 2021-10-24 MED ORDER — CEFAZOLIN SODIUM-DEXTROSE 2-4 GM/100ML-% IV SOLN
2.0000 g | INTRAVENOUS | Status: AC
  Administered 2021-10-24: 2 g via INTRAVENOUS
  Filled 2021-10-24: qty 100

## 2021-10-24 MED ORDER — LIDOCAINE 2% (20 MG/ML) 5 ML SYRINGE
INTRAMUSCULAR | Status: DC | PRN
  Administered 2021-10-24: 100 mg via INTRAVENOUS

## 2021-10-24 MED ORDER — PROPOFOL 10 MG/ML IV BOLUS
INTRAVENOUS | Status: AC
  Filled 2021-10-24: qty 20

## 2021-10-24 MED ORDER — MIDAZOLAM HCL 2 MG/2ML IJ SOLN
INTRAMUSCULAR | Status: AC
  Filled 2021-10-24: qty 2

## 2021-10-24 MED ORDER — LACTATED RINGERS IV SOLN: INTRAVENOUS | Status: DC

## 2021-10-24 MED ORDER — FENTANYL CITRATE PF 50 MCG/ML IJ SOSY: 25.0000 ug | PREFILLED_SYRINGE | INTRAMUSCULAR | Status: DC | PRN

## 2021-10-24 MED ORDER — MIDAZOLAM HCL 2 MG/2ML IJ SOLN: 0.5000 mg | Freq: Once | INTRAMUSCULAR | Status: DC

## 2021-10-24 MED ORDER — ACETAMINOPHEN 325 MG PO TABS: 325.0000 mg | ORAL_TABLET | ORAL | Status: DC | PRN

## 2021-10-24 MED ORDER — ORAL CARE MOUTH RINSE: 15.0000 mL | Freq: Once | OROMUCOSAL | Status: AC

## 2021-10-24 MED ORDER — FENTANYL CITRATE PF 50 MCG/ML IJ SOSY
PREFILLED_SYRINGE | INTRAMUSCULAR | Status: AC
  Filled 2021-10-24: qty 2

## 2021-10-24 MED ORDER — MEPERIDINE HCL 50 MG/ML IJ SOLN: 6.2500 mg | INTRAMUSCULAR | Status: DC | PRN

## 2021-10-24 MED ORDER — ONDANSETRON HCL 4 MG/2ML IJ SOLN: 4.0000 mg | Freq: Once | INTRAMUSCULAR | Status: DC | PRN

## 2021-10-24 MED ORDER — PROPOFOL 10 MG/ML IV BOLUS
INTRAVENOUS | Status: DC | PRN
  Administered 2021-10-24: 150 mg via INTRAVENOUS

## 2021-10-24 MED ORDER — FENTANYL CITRATE PF 50 MCG/ML IJ SOSY
25.0000 ug | PREFILLED_SYRINGE | Freq: Once | INTRAMUSCULAR | Status: AC
  Administered 2021-10-24: 100 ug via INTRAVENOUS

## 2021-10-24 MED ORDER — ACETAMINOPHEN 160 MG/5ML PO SOLN: 325.0000 mg | ORAL | Status: DC | PRN

## 2021-10-24 MED ORDER — FENTANYL CITRATE (PF) 100 MCG/2ML IJ SOLN
INTRAMUSCULAR | Status: AC
  Filled 2021-10-24: qty 2

## 2021-10-24 MED ORDER — LIDOCAINE HCL (PF) 2 % IJ SOLN
INTRAMUSCULAR | Status: AC
  Filled 2021-10-24: qty 5

## 2021-10-24 MED ORDER — ACETAMINOPHEN 325 MG PO TABS
ORAL_TABLET | ORAL | Status: AC
  Filled 2021-10-24: qty 2

## 2021-10-24 MED ORDER — ACETAMINOPHEN 325 MG PO TABS
325.0000 mg | ORAL_TABLET | ORAL | Status: DC | PRN
  Administered 2021-10-24: 650 mg via ORAL

## 2021-10-24 MED ORDER — CHLORHEXIDINE GLUCONATE 0.12 % MT SOLN
15.0000 mL | Freq: Once | OROMUCOSAL | Status: AC
  Administered 2021-10-24: 15 mL via OROMUCOSAL

## 2021-10-24 SURGICAL SUPPLY — 21 items
BAG COUNTER SPONGE SURGICOUNT (BAG) IMPLANT
BAG SPNG CNTER NS LX DISP (BAG)
BLADE SAW SGTL 11.0X1.19X90.0M (BLADE) IMPLANT
BNDG ADH 1X3 SHEER STRL LF (GAUZE/BANDAGES/DRESSINGS) IMPLANT
BNDG ADH THN 3X1 STRL LF (GAUZE/BANDAGES/DRESSINGS)
COVER SURGICAL LIGHT HANDLE (MISCELLANEOUS) ×2 IMPLANT
DURAPREP 26ML APPLICATOR (WOUND CARE) ×2 IMPLANT
GAUZE SPONGE 4X4 12PLY STRL (GAUZE/BANDAGES/DRESSINGS) IMPLANT
GLOVE BIOGEL M 7.0 STRL (GLOVE) IMPLANT
GLOVE BIOGEL PI IND STRL 7.5 (GLOVE) ×6 IMPLANT
GLOVE BIOGEL PI IND STRL 8.5 (GLOVE) ×2 IMPLANT
GLOVE ECLIPSE 8.0 STRL XLNG CF (GLOVE) IMPLANT
GLOVE INDICATOR 6.5 STRL GRN (GLOVE) ×2 IMPLANT
GLOVE SURG ORTHO 8.0 STRL STRW (GLOVE) ×2 IMPLANT
GOWN STRL REUS W/ TWL LRG LVL3 (GOWN DISPOSABLE) ×4 IMPLANT
GOWN STRL REUS W/TWL LRG LVL3 (GOWN DISPOSABLE)
MANIFOLD NEPTUNE II (INSTRUMENTS) ×2 IMPLANT
NDL SAFETY ECLIP 18X1.5 (MISCELLANEOUS) IMPLANT
PROTECTOR NERVE ULNAR (MISCELLANEOUS) ×2 IMPLANT
SYR CONTROL 10ML LL (SYRINGE) IMPLANT
TOWEL OR 17X26 10 PK STRL BLUE (TOWEL DISPOSABLE) ×4 IMPLANT

## 2021-10-24 NOTE — Anesthesia Preprocedure Evaluation (Addendum)
Anesthesia Evaluation  Patient identified by MRN, date of birth, ID band Patient awake    Reviewed: Allergy & Precautions, NPO status , Patient's Chart, lab work & pertinent test results  History of Anesthesia Complications (+) PONV and history of anesthetic complications  Airway Mallampati: II  TM Distance: >3 FB Neck ROM: Full    Dental  (+) Teeth Intact   Pulmonary asthma , COPD,  COPD inhaler,    Pulmonary exam normal        Cardiovascular hypertension, Pt. on medications  Rhythm:Regular Rate:Normal     Neuro/Psych PSYCHIATRIC DISORDERS Anxiety Depression negative neurological ROS     GI/Hepatic Neg liver ROS, hiatal hernia, GERD  Medicated,  Endo/Other  negative endocrine ROSMorbid obesity  Renal/GU negative Renal ROS  negative genitourinary   Musculoskeletal  (+) Arthritis , Osteoarthritis,    Abdominal (+)  Abdomen: soft. Bowel sounds: normal.  Peds negative pediatric ROS (+)  Hematology negative hematology ROS (+)   Anesthesia Other Findings   Reproductive/Obstetrics negative OB ROS                             Anesthesia Physical  Anesthesia Plan  ASA: 3  Anesthesia Plan: General and Regional   Post-op Pain Management: Minimal or no pain anticipated and Regional block*   Induction: Intravenous  PONV Risk Score and Plan: 4 or greater and Ondansetron, Dexamethasone, Treatment may vary due to age or medical condition and Propofol infusion  Airway Management Planned: Mask, Natural Airway and Simple Face Mask  Additional Equipment: None  Intra-op Plan:   Post-operative Plan: Extubation in OR  Informed Consent: I have reviewed the patients History and Physical, chart, labs and discussed the procedure including the risks, benefits and alternatives for the proposed anesthesia with the patient or authorized representative who has indicated his/her understanding and  acceptance.     Dental advisory given  Plan Discussed with: CRNA and Anesthesiologist  Anesthesia Plan Comments:       Anesthesia Quick Evaluation

## 2021-10-24 NOTE — Anesthesia Postprocedure Evaluation (Signed)
Anesthesia Post Note  Patient: Tasha Hunt  Procedure(s) Performed: CLOSED MANIPULATION KNEE (Left: Knee)     Patient location during evaluation: PACU Anesthesia Type: Regional and General Level of consciousness: awake and alert Pain management: pain level controlled Vital Signs Assessment: post-procedure vital signs reviewed and stable Respiratory status: spontaneous breathing, nonlabored ventilation, respiratory function stable and patient connected to nasal cannula oxygen Cardiovascular status: blood pressure returned to baseline and stable Postop Assessment: no apparent nausea or vomiting Anesthetic complications: no   No notable events documented.  Last Vitals:  Vitals:   10/24/21 1430 10/24/21 1450  BP: (!) 122/96 119/73  Pulse: 72 74  Resp: 17 16  Temp:  36.7 C  SpO2: 96% 94%    Last Pain:  Vitals:   10/24/21 1450  TempSrc:   PainSc: 3                  Rubert Frediani

## 2021-10-24 NOTE — Op Note (Signed)
NAMELEZLIE, RITCHEY MEDICAL RECORD NO: [id number] ACCOUNT NO: [id number] DATE OF BIRTH: [birth year] FACILITY: [facility] LOCATION: [facility] PHYSICIAN: Pietro Cassis. Alvan Dame, MD  Operative Report   DATE OF PROCEDURE: 10/24/2021  PREOPERATIVE DIAGNOSES:  Status post left total knee arthroplasty with postoperative pain and stiffness, arthrofibrosis.  POSTOPERATIVE DIAGNOSES:  Status post left total knee arthroplasty with postoperative pain and stiffness, arthrofibrosis.  PROCEDURE:  Manipulation of left knee under anesthesia.  SURGEON:  Pietro Cassis. Alvan Dame, MD  ASSISTANT:  Surgical team.  ANESTHESIA:  IV sedation under monitored anesthesia care.  COMPLICATIONS:  None.  FINDINGS:  Please see the body of the operative note for details.  INDICATIONS FOR THE PROCEDURE:  The patient is a 76 year old female with history of left total knee arthroplasty performed about 5 months ago at this point.  She recognized that there were early issues with regards to getting physical therapy set up.   She presented to see me as a second opinion after her knee arthroplasty and noted that she had persistent pain and stiffness, both with extension and flexion.  In the office, she lacked at least 5 degrees of extension and flexed only to about 80 degrees.   She had pain and tightness with the extremes of motion.  Based on this, we discussed trying to do a manipulation despite the fact that she was over 4-1/2 months out from her surgery.  I discussed that there may be limitations in what the manipulation  can obtain. We discussed performing physical therapy afterwards to maximize her overall recovery.  Risks of persistent pain and stiffness noted.  Risks of any bony or soft tissue injuries noted.  Consent was obtained for management of her pain as well as  trying to improve motion.  PROCEDURE IN DETAIL:  The patient was brought to the operative theater.  Once adequate anesthesia as well as antibiotics administered, she was  positioned supine.  A timeout was performed identifying the patient, planned procedure, and extremity.  Once  that was performed, I examined her knee. I did find that she had persistent extensor lag, or flexion contracture of at least 5 degrees.  However, when she was asleep, she flexed over 90 degrees passively.  I first worked on extension by placing her left  heel on to my shoulder and applied a downward pressure through her knee.  I was able to get her stretched with mild lysis of adhesions posteriorly to full knee extension.  She did want to somewhat spring back to a little bit of a flexion contracture, but  she was able to be passively straightened.  I then flexed her hip and applied pressure on her tibia as we flexed her knee.  I was able to flex her knee to get her calf all the way back to her hamstring noting motion of at least 120-130 degrees.  Once I  completed this, I revisited each of these exercises 3 times to apply a stretch for 30 seconds at a time.  Once this was performed and I realized there was no further motion being obtained, we concluded the procedure.  She was awoken from anesthesia and  brought to the recovery room in stable condition.  Postoperatively, she will be discharged from the hospital after the recovery room.  We will work on trying to get physical therapy set up to work with her as much as possible.  Pain medication as well as a prednisone Dosepak to help with  post-manipulation inflammation prescribed.  We will see  her back in the office in a couple weeks to check her recovery.   NIK D: 10/24/2021 2:01:33 pm T: 10/24/2021 9:47:00 pm  JOB: [id number]/ [id number]

## 2021-10-24 NOTE — H&P (Signed)
CC- Tasha Hunt is a 76 y.o. female who presents with left knee pain.  HPI- . Knee Pain: Tasha Hunt is a very pleasant 76 year old female who presented to the office for evaluation of her left knee 4-1/2 months status post arthroplasty.  Though we had performed her right total knee replacement she had her left knee replaced by a surgeon in a different city.  She reports some challenges with getting into physical therapy as well as her overall movement.  Her primary complaint at this point is stiffness and pain. No reports of any fevers chills or night sweats.  Past Medical History:  Diagnosis Date   Allergic rhinitis, cause unspecified    Aneurysm of splenic artery ([place])    noted since 2002   Anxiety    Arthritis    hands   Asthma    Complication of anesthesia    COPD (chronic obstructive pulmonary disease) (HCC)    Depressive disorder, not elsewhere classified    Dyspnea    Esophageal reflux    H/O hiatal hernia    Hypertension    Obesity, unspecified    Pneumonia    03/27/11 "many many years ago"   PONV (postoperative nausea and vomiting)    Ulcerative colitis ([place])     Past Surgical History:  Procedure Laterality Date   ABDOMINAL HYSTERECTOMY  1980   ARTERIAL ANEURYSM REPAIR  03/27/2011   splenic artery   BREAST BIOPSY  2003   right   BREAST BIOPSY  2012   left   BREAST LUMPECTOMY WITH RADIOACTIVE SEED LOCALIZATION Right 07/06/2019   Procedure: RIGHT BREAST LUMPECTOMY WITH RADIOACTIVE SEED LOCALIZATION;  Surgeon: Erroll Luna, MD;  Location: [facility];  Service: General;  Laterality: Right;   CATARACT EXTRACTION W/ INTRAOCULAR LENS IMPLANT Bilateral    CHOLECYSTECTOMY  2002   EMBOLIZATION N/A 03/27/2011   Procedure: EMBOLIZATION;  Surgeon: Elam Dutch, MD;  Location: [facility] CATH LAB;  Service: Cardiovascular;  Laterality: N/A;   HERNIA REPAIR     LAPAROSCOPIC NISSEN FUNDOPLICATION  5809   NASAL SEPTUM SURGERY  1985   deviated septum    REVERSE SHOULDER ARTHROPLASTY Right 11/17/2019   Procedure: REVERSE SHOULDER ARTHROPLASTY;  Surgeon: Netta Cedars, MD;  Location: [facility];  Service: Orthopedics;  Laterality: Right;  interscalene block   TONSILLECTOMY  1948   TOTAL KNEE ARTHROPLASTY Right 04/20/2016   Procedure: RIGHT TOTAL KNEE ARTHROPLASTY;  Surgeon: Paralee Cancel, MD;  Location: [facility];  Service: Orthopedics;  Laterality: Right;   TOTAL KNEE ARTHROPLASTY Left 06/19/2021    Prior to Admission medications   Medication Sig Start Date End Date Taking? Authorizing Provider  acetaminophen (TYLENOL) 325 MG tablet Take 650 mg by mouth every 6 (six) hours as needed for moderate pain or headache.   Yes [provider]  albuterol (VENTOLIN HFA) 108 (90 Base) MCG/ACT inhaler Inhale 1 puff into the lungs every 6 (six) hours as needed for wheezing or shortness of breath.   Yes [provider]  ALPRAZolam (XANAX) 0.25 MG tablet Take 0.25 mg by mouth 2 (two) times daily as needed for anxiety.    Yes [provider]  atorvastatin (LIPITOR) 20 MG tablet Take 20 mg by mouth daily.   Yes [provider]  cetirizine (ZYRTEC) 10 MG tablet Take 10 mg by mouth daily.   Yes [provider]  Cholecalciferol (DIALYVITE VITAMIN D 5000) 125 MCG (5000 UT) capsule Take 5,000 Units by mouth daily.   Yes [provider]  DILT-XR 180 MG 24 hr capsule Take 180 mg by mouth daily.  01/21/15  Yes [provider]  DULoxetine (CYMBALTA) 60 MG capsule Take 60 mg by mouth daily.   Yes [provider]  fluticasone (FLONASE) 50 MCG/ACT nasal spray Place 2 sprays into the nose daily.   Yes [provider]  Fluticasone Furoate (ARNUITY ELLIPTA) 100 MCG/ACT AEPB Inhale 1 Dose into the lungs daily. Patient taking differently: Inhale 1 puff into the lungs daily. 02/12/15  Yes Kozlow, Donnamarie Poag, MD  folic acid (FOLVITE) 1 MG tablet Take 1 mg by mouth daily.   Yes [provider]   hydrochlorothiazide (HYDRODIURIL) 12.5 MG tablet Take 12.5 mg by mouth daily.  02/09/15  Yes [provider]  hydrOXYzine (ATARAX) 25 MG tablet Take 25 mg by mouth 3 (three) times daily as needed for anxiety. 09/26/21  Yes [provider]  lidocaine (LIDODERM) 5 % Place 1 patch onto the skin daily as needed (back pain). Remove & Discard patch within 12 hours or as directed by MD   Yes [provider]  Misc Natural Products (OSTEO BI-FLEX ADV TRIPLE ST PO) Take 2 tablets by mouth daily.   Yes [provider]  Polyethyl Glycol-Propyl Glycol (SYSTANE) 0.4-0.3 % SOLN Place 1 drop into both eyes 4 (four) times daily as needed (for dry eyes).   Yes [provider]  sulfaSALAzine (AZULFIDINE) 500 MG tablet Take 1,000 mg by mouth 2 (two) times daily. 09/24/21  Yes [provider]  HYDROcodone-acetaminophen (NORCO/VICODIN) 5-325 MG tablet Take 1 tablet by mouth every 6 (six) hours as needed for moderate pain or severe pain. Patient not taking: Reported on 10/16/2021 11/17/19   Netta Cedars, MD  ketoconazole (NIZORAL) 2 % cream Apply 1 application topically daily as needed (fungus).    [provider]    Left knee exam: Well-healed surgical incision without signs of infection Limited arc of motion lacking at least 5 degrees of extension and flexion just to 90 degrees with tightness and pain No significant lower extremity edema, erythema or calf tenderness  Physical Examination: General appearance - alert, well appearing, and in no distress Mental status - alert, oriented to person, place, and time Chest - no tachypnea, retractions or cyanosis Heart - normal rate and regular rhythm Abdomen - soft, nontender, nondistended, no masses or organomegaly Neurological - alert, oriented, normal speech, no focal findings or movement disorder noted Musculoskeletal - see left knee exam Extremities - peripheral pulses normal, no pedal edema, no clubbing or  cyanosis Skin - normal coloration and turgor, no rashes, no suspicious skin lesions noted   Assessment: Status post left total knee arthroplasty with persistent postoperative pain and stiffness, arthrofibrosis  Plan: In the office we reviewed the condition of her left knee and her concerns regarding her lack of motion.  We discussed trying to perform a manipulation to see if were able to break through or lyse adhesions.  I discussed with her that given the fact that she is over 4 months out there is a chance that we may not be able to improve significantly the motion with lysis of adhesions however we could give it a good stretch nonetheless.  She elects to proceed.  Risks of bone and soft tissue injury including ligament and muscle were reviewed.  No significant risk for infection or DVT.  There is a risk of persistent postoperative pain and stiffness.  Postoperatively we will prescribe a prednisone Dosepak to try to decrease  inflammation postmanipulation as well as pain medication as well as physical therapy to immediately begin working on her motion and strength.

## 2021-10-24 NOTE — Anesthesia Procedure Notes (Signed)
Procedure Name: General with mask airway Date/Time: 10/24/2021 1:41 PM  Performed by: Sharlette Dense, CRNAPatient Re-evaluated:Patient Re-evaluated prior to induction Oxygen Delivery Method: Circle system utilized Preoxygenation: Pre-oxygenation with 100% oxygen Induction Type: IV induction Ventilation: Mask ventilation without difficulty Placement Confirmation: positive ETCO2 and breath sounds checked- equal and bilateral Dental Injury: Teeth and Oropharynx as per pre-operative assessment

## 2021-10-24 NOTE — Interval H&P Note (Signed)
History and Physical Interval Note:  10/24/2021 1:01 PM  Tasha Hunt  has presented today for surgery, with the diagnosis of LEFT knee arthrofibrosis.  The various methods of treatment have been discussed with the patient and family. After consideration of risks, benefits and other options for treatment, the patient has consented to  Procedure(s): CLOSED MANIPULATION KNEE (Left) as a surgical intervention.  The patient's history has been reviewed, patient examined, no change in status, stable for surgery.  I have reviewed the patient's chart and labs.  Questions were answered to the patient's satisfaction.     Mauri Pole

## 2021-10-24 NOTE — Transfer of Care (Signed)
Immediate Anesthesia Transfer of Care Note  Patient: Tasha Hunt  Procedure(s) Performed: CLOSED MANIPULATION KNEE (Left: Knee)  Patient Location: PACU  Anesthesia Type:General  Level of Consciousness: awake and alert   Airway & Oxygen Therapy: Patient Spontanous Breathing and Patient connected to face mask oxygen  Post-op Assessment: Report given to RN and Post -op Vital signs reviewed and stable  Post vital signs: Reviewed and stable  Last Vitals:  Vitals Value Taken Time  BP    Temp    Pulse 77 10/24/21 1353  Resp 22 10/24/21 1353  SpO2 99 % 10/24/21 1353  Vitals shown include unvalidated device data.  Last Pain:  Vitals:   10/24/21 1138  TempSrc: Oral  PainSc: 0-No pain      Patients Stated Pain Goal: 4 (45/99/77 4142)  Complications: No notable events documented.

## 2021-10-24 NOTE — Brief Op Note (Signed)
10/24/2021  1:07 PM  PATIENT:  Tasha Hunt  76 y.o. female  PRE-OPERATIVE DIAGNOSIS:  LEFT knee arthrofibrosis  POST-OPERATIVE DIAGNOSIS: Status post left total knee arthroplasty with postoperative pain and stiffness, arthrofibrosis  PROCEDURE:  Procedure(s): CLOSED MANIPULATION KNEE (Left)  SURGEON:  Surgeon(s) and Role:    Paralee Cancel, MD - Primary  PHYSICIAN ASSISTANT: None   ANESTHESIA:   MAC  EBL: None  BLOOD ADMINISTERED:none  DRAINS: none   LOCAL MEDICATIONS USED:  NONE  SPECIMEN:  No Specimen  DISPOSITION OF SPECIMEN:  N/A  COUNTS:  YES  TOURNIQUET:  * No tourniquets in log *  DICTATION: .Other Dictation: Dictation Number [id number]  PLAN OF CARE: Discharge to home after PACU  PATIENT DISPOSITION:  PACU - hemodynamically stable.   Delay start of Pharmacological VTE agent (>24hrs) due to surgical blood loss or risk of bleeding: no

## 2021-10-25 ENCOUNTER — Encounter (HOSPITAL_COMMUNITY): Payer: Self-pay | Admitting: Orthopedic Surgery

## 2021-10-31 ENCOUNTER — Encounter (INDEPENDENT_AMBULATORY_CARE_PROVIDER_SITE_OTHER): Payer: PPO | Admitting: Ophthalmology

## 2022-01-29 ENCOUNTER — Encounter (INDEPENDENT_AMBULATORY_CARE_PROVIDER_SITE_OTHER): Payer: Medicare Other | Admitting: Ophthalmology

## 2022-01-29 DIAGNOSIS — D3131 Benign neoplasm of right choroid: Secondary | ICD-10-CM

## 2022-01-29 DIAGNOSIS — I10 Essential (primary) hypertension: Secondary | ICD-10-CM | POA: Diagnosis not present

## 2022-01-29 DIAGNOSIS — H43813 Vitreous degeneration, bilateral: Secondary | ICD-10-CM | POA: Diagnosis not present

## 2022-01-29 DIAGNOSIS — H35033 Hypertensive retinopathy, bilateral: Secondary | ICD-10-CM | POA: Diagnosis not present

## 2022-02-12 IMAGING — MG MM BREAST BX W/ LOC DEV 1ST LESION IMAGE BX SPEC STEREO GUIDE*R*
5 series · 6 of 17 positions shown · non-contrast
Comparison: Previous exams.
COMPARISON: Previous exams.

Addendum:
CLINICAL DATA: The patient presents for stereotactic guided core
biopsy of RIGHT breast calcifications.

EXAM:
RIGHT BREAST STEREOTACTIC CORE NEEDLE BIOPSY

[R CC (1 of 2)]
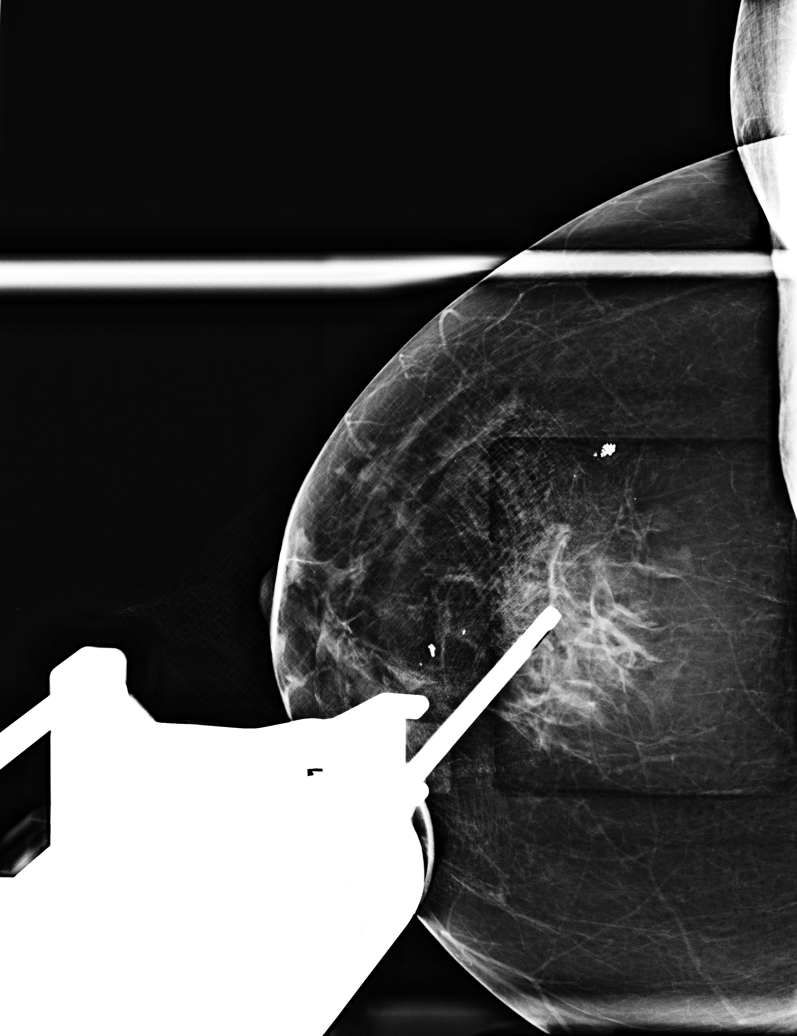

[R CC (2 of 2)]
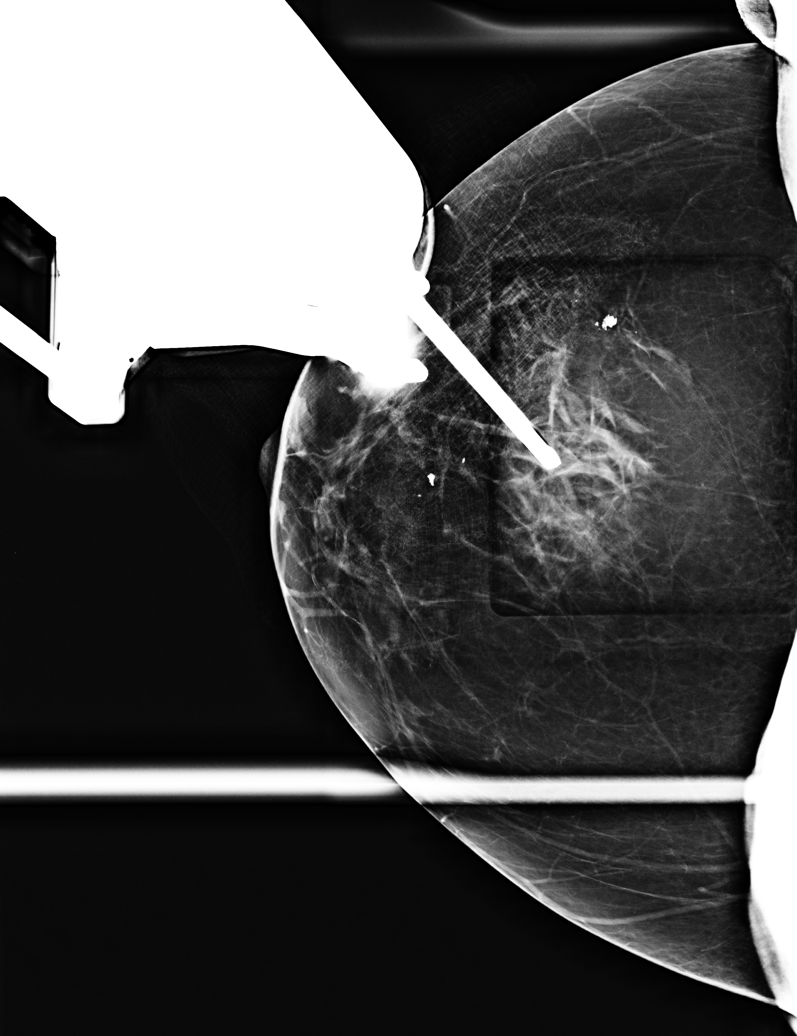

[R CC tomo · 2 of 49 frames shown (1 of 3)]
[frame 16/49]
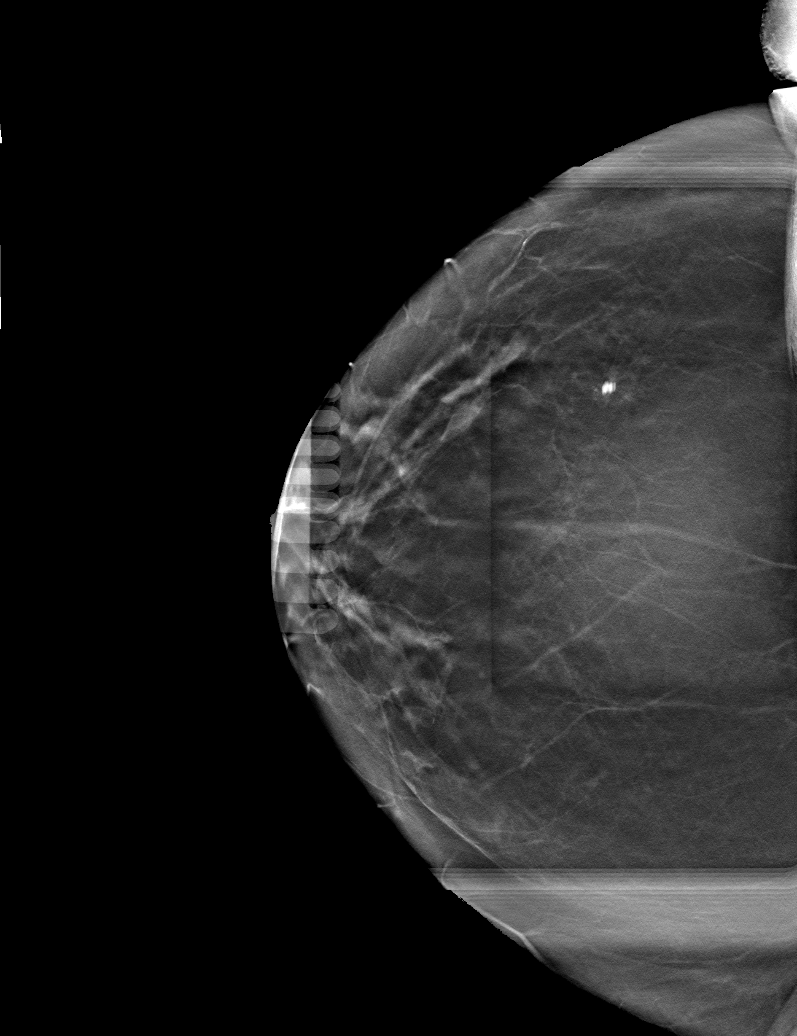
[frame 25/49]
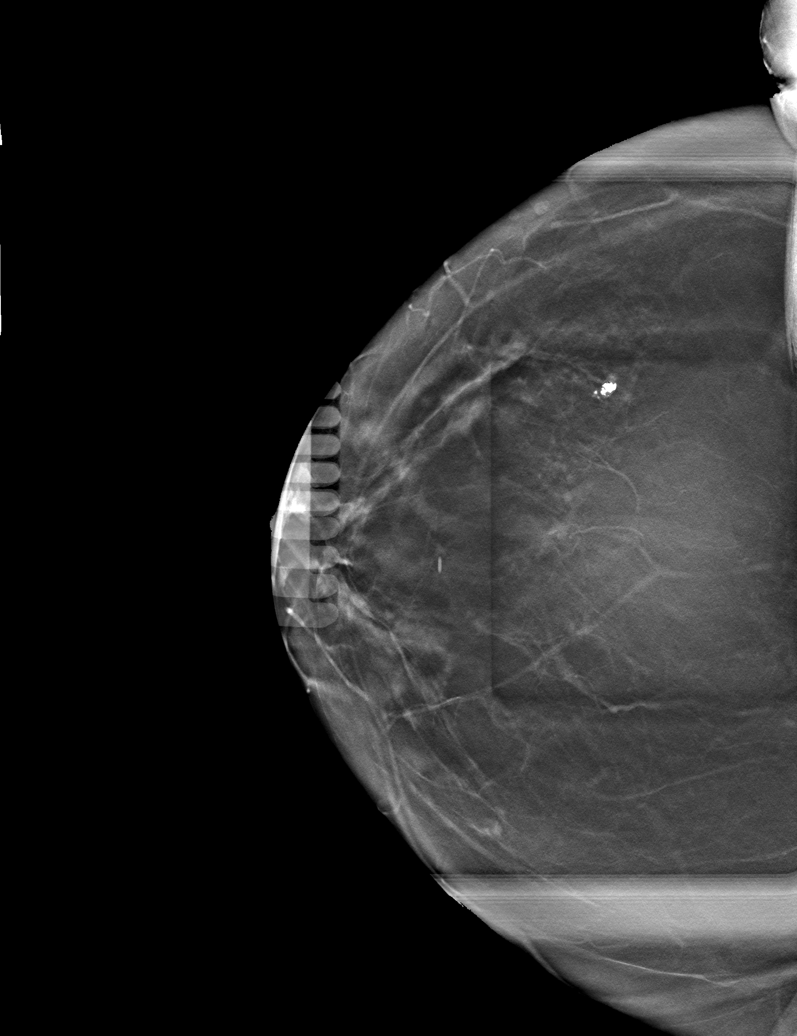

[R CC tomo (2 of 3) · tomo slice 25/49.0]
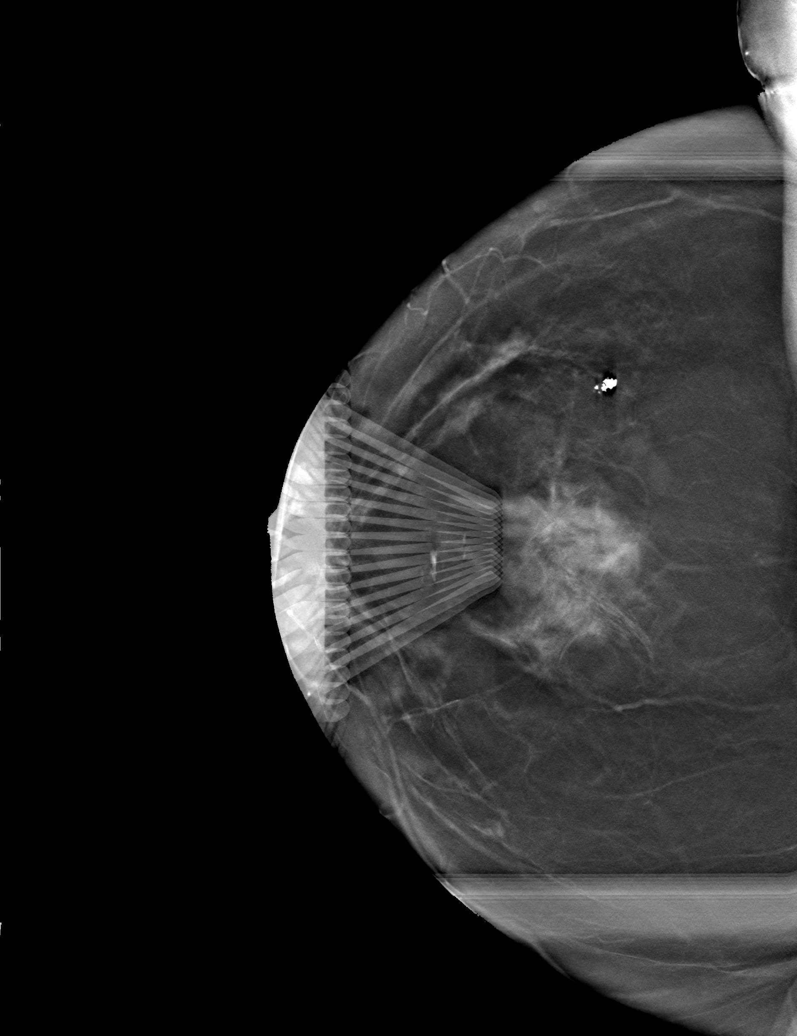

[R CC tomo (3 of 3) · tomo slice 25/49.0]
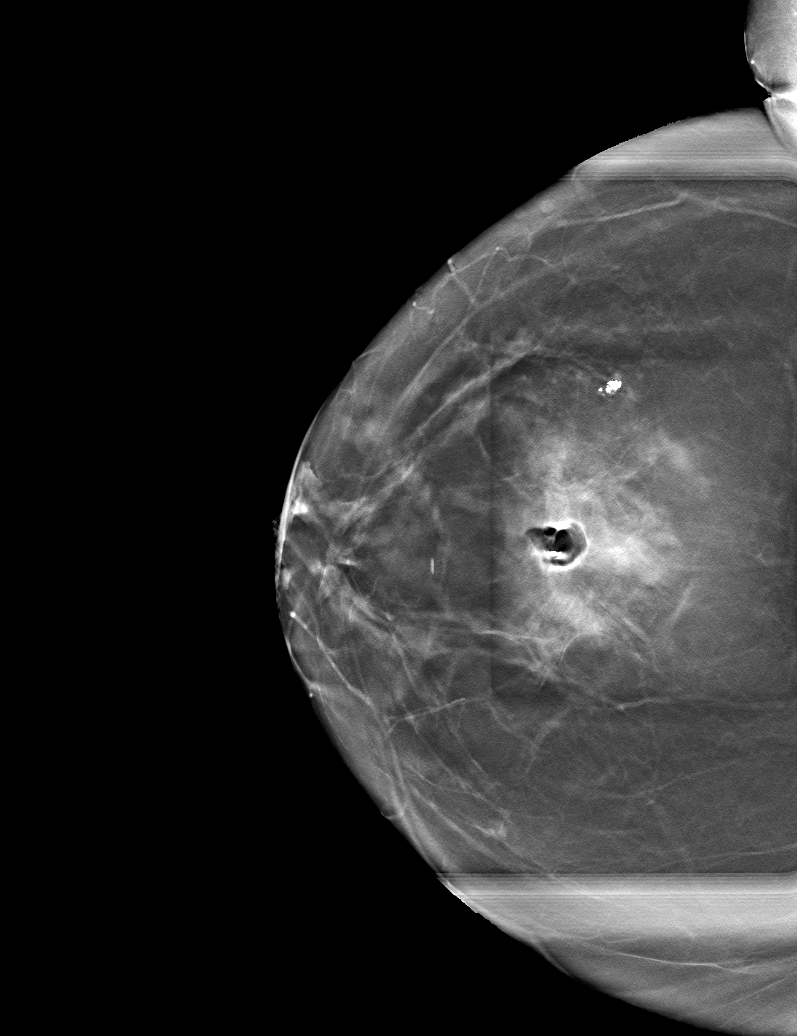

[6 of 17 positions shown; findings below may reference images not displayed]



Using sterile technique and 1% lidocaine and 1% lidocaine with
epinephrine as local anesthetic, under stereotactic guidance, a 9
gauge vacuum assisted device was used to perform core needle biopsy
of calcifications in the UPPER central portion of the RIGHT breast
using a craniocaudal approach. Specimen radiograph was performed
showing calcifications in numerous specimens. Specimens with
calcifications are identified for pathology.

Lesion quadrant: UPPER central RIGHT breast

At the conclusion of the procedure, coil shaped tissue marker clip
was deployed into the biopsy cavity. Follow-up 2-view mammogram was
performed and dictated separately.
IMPRESSION: Stereotactic-guided biopsy of RIGHT breast calcifications. No
apparent complications.

ADDENDUM:
Pathology revealed ATYPICAL DUCTAL HYPERPLASIA WITH CALCIFICATIONS
of the RIGHT breast, upper central, coil clip. This was found to be
concordant by Dr. Kovor Kunkpe, with excision recommended.

Pathology results were discussed with the patient by telephone. The
patient reported doing well after the biopsy with tenderness at the
site. Post biopsy instructions and care were reviewed and questions
were answered. The patient was encouraged to call The [REDACTED]

Surgical consultation has been arranged with Dr. Dathi Meseiro at
[REDACTED] on June 09, 2019.

Pathology results reported by Neelamohan Mongolian RN on 05/24/2019.



Using sterile technique and 1% lidocaine and 1% lidocaine with
epinephrine as local anesthetic, under stereotactic guidance, a 9
gauge vacuum assisted device was used to perform core needle biopsy
of calcifications in the UPPER central portion of the RIGHT breast
using a craniocaudal approach. Specimen radiograph was performed
showing calcifications in numerous specimens. Specimens with
calcifications are identified for pathology.

Lesion quadrant: UPPER central RIGHT breast

At the conclusion of the procedure, coil shaped tissue marker clip
was deployed into the biopsy cavity. Follow-up 2-view mammogram was
performed and dictated separately.
IMPRESSION: Stereotactic-guided biopsy of RIGHT breast calcifications. No
apparent complications.

## 2022-02-12 IMAGING — MG MM BREAST LOCALIZATION CLIP
5 series · 6 of 13 positions shown · non-contrast
Comparison: Previous exam(s).

CLINICAL DATA: Status post stereotactic guided core biopsy of RIGHT
breast calcifications.

EXAM:
DIAGNOSTIC RIGHT MAMMOGRAM POST STEREOTACTIC BIOPSY

[R SPECIMEN]
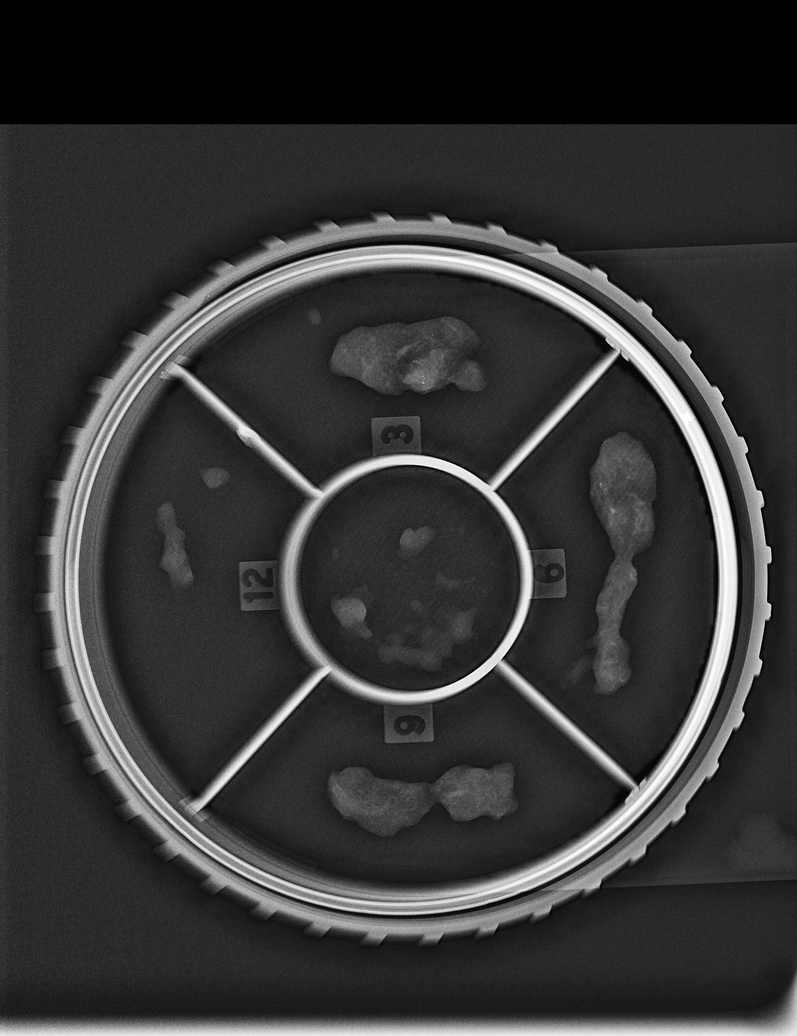

[R CC synth-2D]
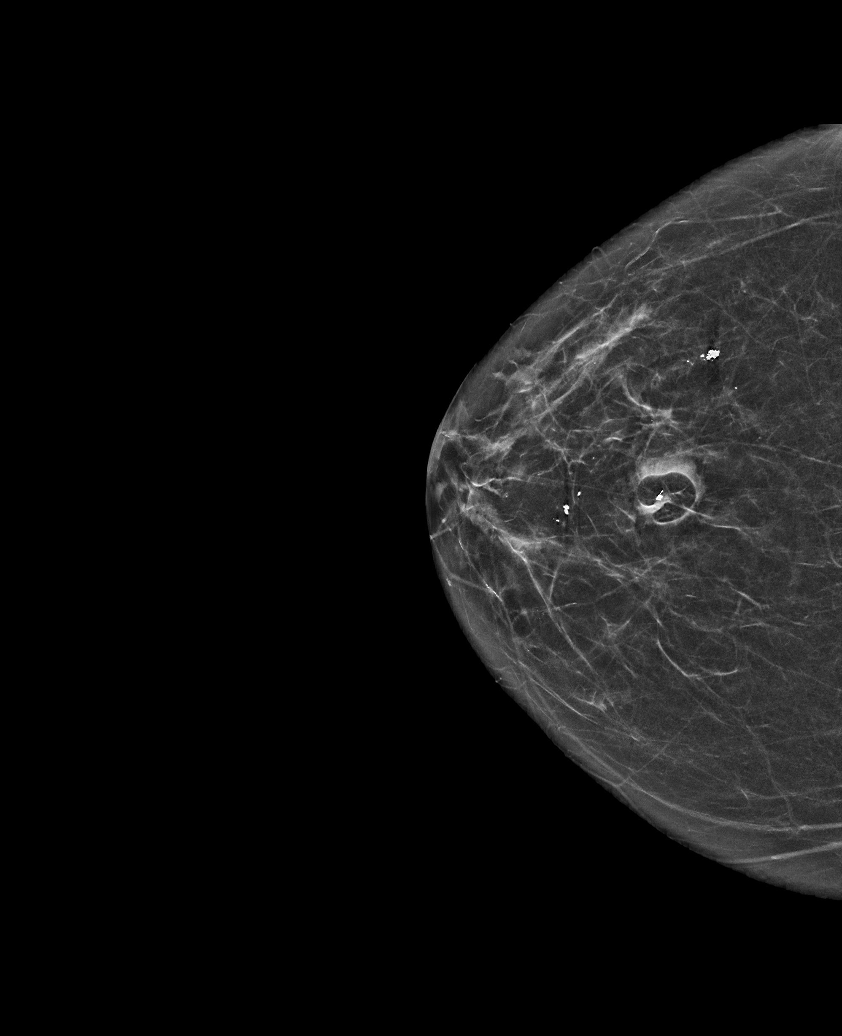

[R ML synth-2D]
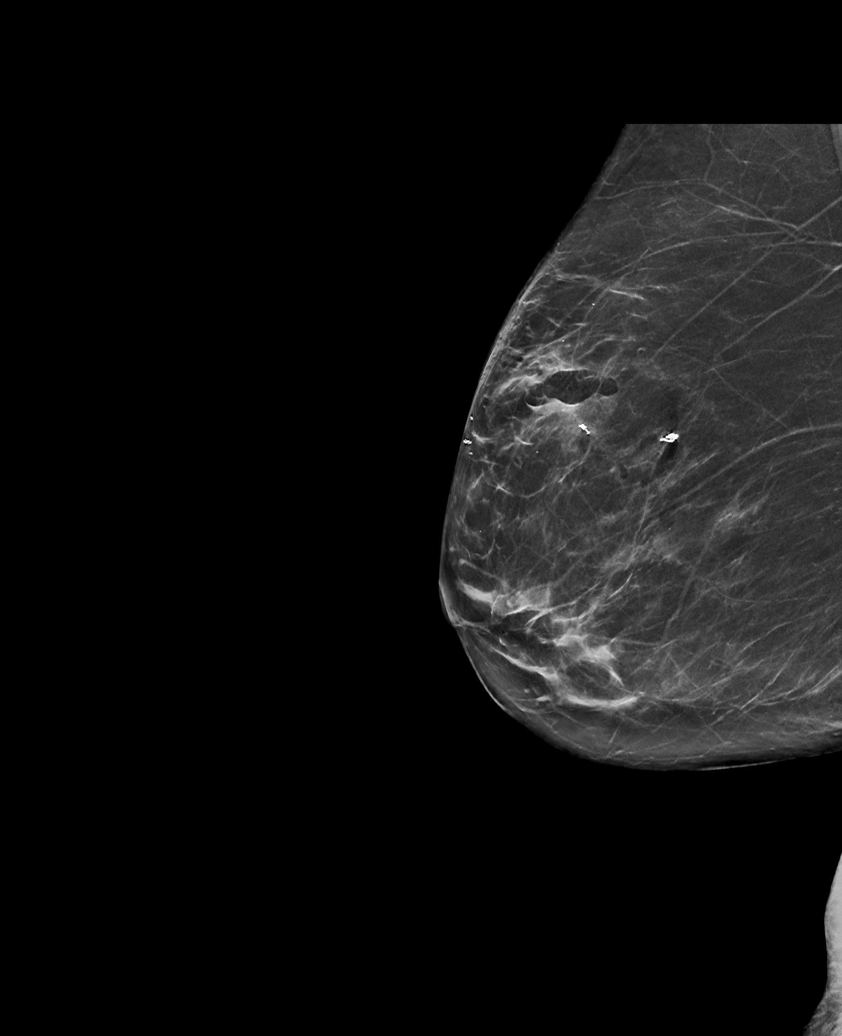

[R CC tomo · 2 of 55 frames shown]
[frame 18/55]
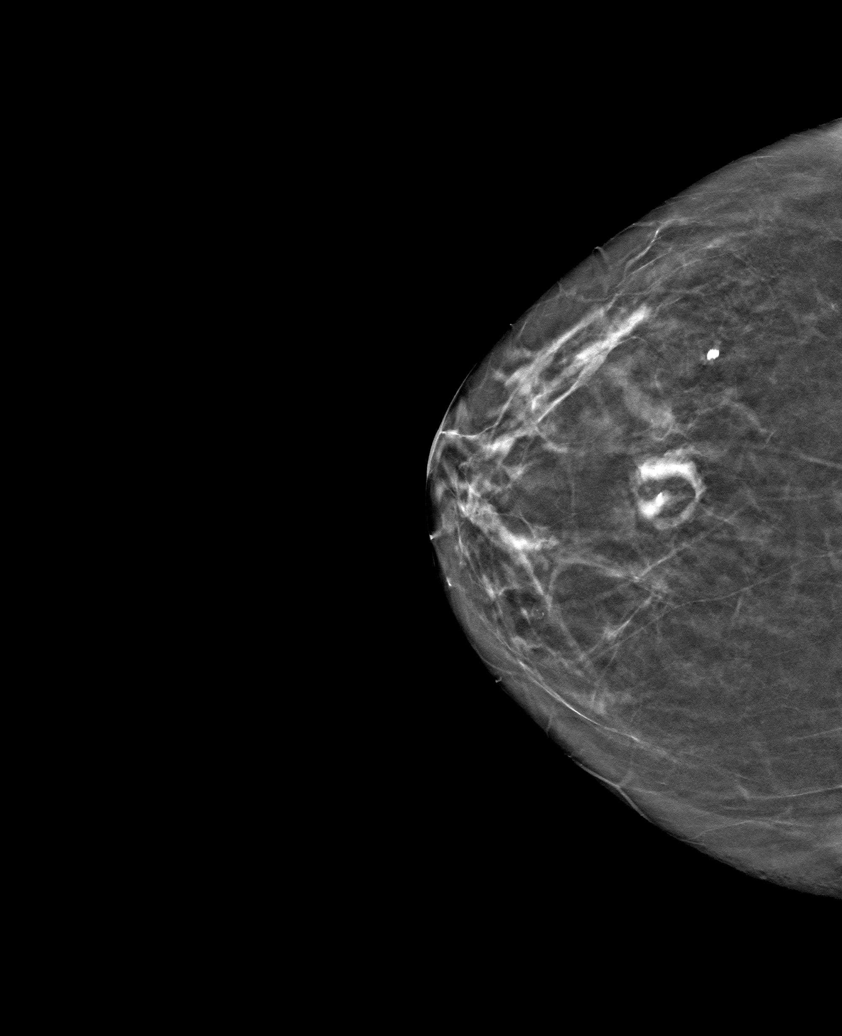
[frame 28/55]
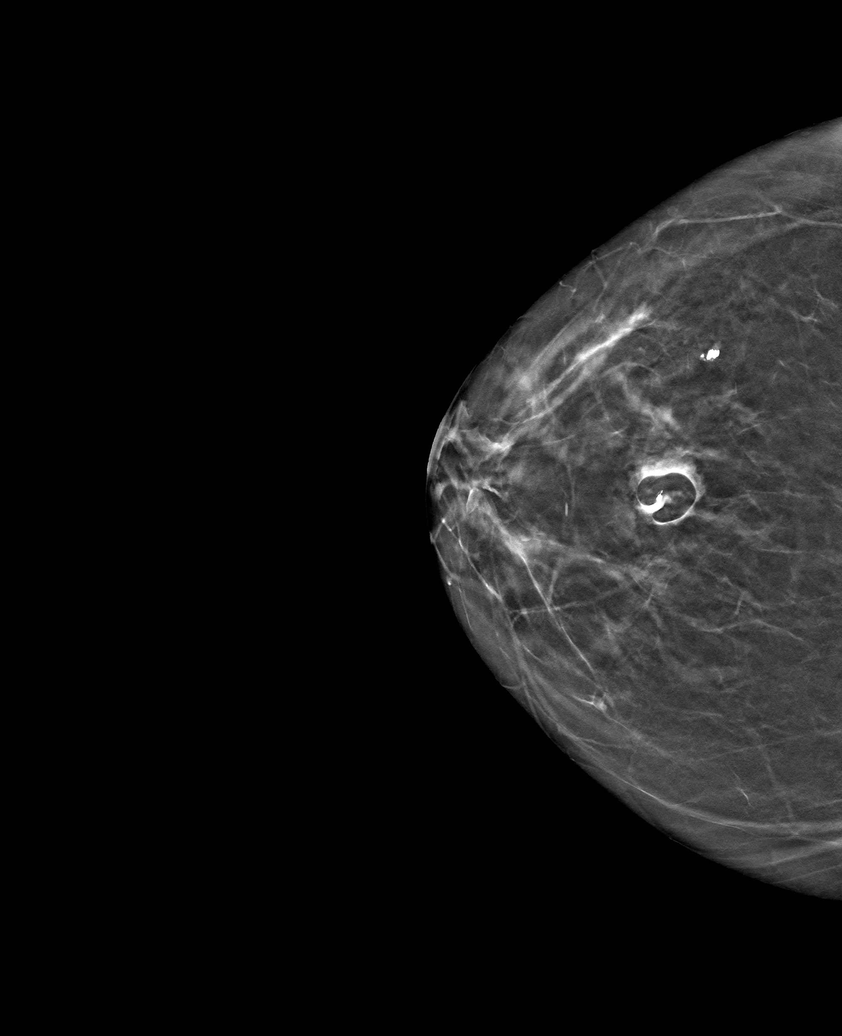

[R ML tomo · tomo slice 34/67.0]
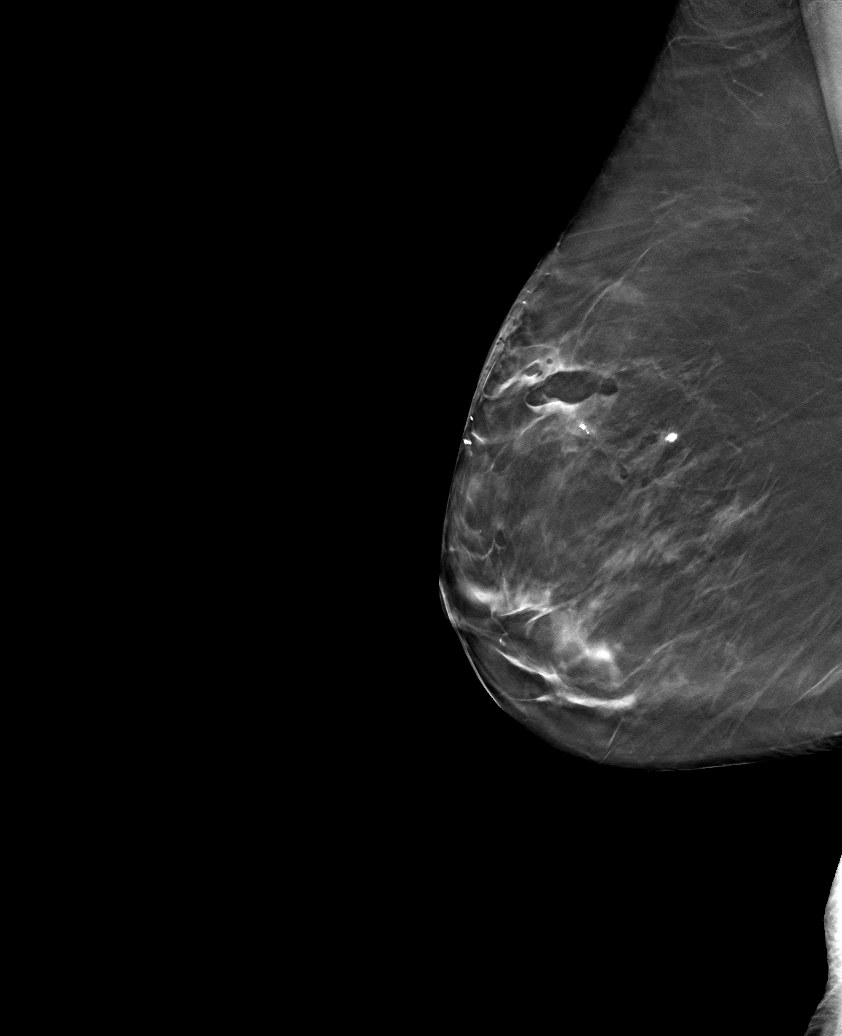

[6 of 13 positions shown; findings below may reference images not displayed]

FINDINGS: Mammographic images were obtained following stereotactic guided
biopsy of calcifications in the UPPER central portion of the RIGHT
breast and placement of a coil shaped clip. The biopsy marking clip
is in expected position at the site of biopsy.
IMPRESSION: Appropriate positioning of the coil shaped biopsy marking clip at
the site of biopsy in the UPPER central RIGHT breast.

Final Assessment: Post Procedure Mammograms for Marker Placement

## 2022-03-06 ENCOUNTER — Other Ambulatory Visit: Payer: Self-pay

## 2022-03-06 ENCOUNTER — Other Ambulatory Visit: Payer: Self-pay | Admitting: Surgery

## 2022-03-06 DIAGNOSIS — Z1239 Encounter for other screening for malignant neoplasm of breast: Secondary | ICD-10-CM

## 2022-03-31 ENCOUNTER — Ambulatory Visit
Admission: RE | Admit: 2022-03-31 | Discharge: 2022-03-31 | Disposition: A | Discharge status: Home / Self Care | Payer: Medicare Other | Source: Ambulatory Visit | Attending: Surgery | Admitting: Surgery

## 2022-03-31 DIAGNOSIS — Z1239 Encounter for other screening for malignant neoplasm of breast: Secondary | ICD-10-CM

## 2022-03-31 MED ORDER — GADOPICLENOL 0.5 MMOL/ML IV SOLN
8.0000 mL | Freq: Once | INTRAVENOUS | Status: AC | PRN
  Administered 2022-03-31: 8 mL via INTRAVENOUS

## 2022-06-18 ENCOUNTER — Other Ambulatory Visit: Payer: Self-pay | Admitting: Internal Medicine

## 2022-06-18 DIAGNOSIS — Z1231 Encounter for screening mammogram for malignant neoplasm of breast: Secondary | ICD-10-CM

## 2022-07-28 ENCOUNTER — Emergency Department (HOSPITAL_COMMUNITY): Payer: Medicare Other

## 2022-07-28 ENCOUNTER — Other Ambulatory Visit: Payer: Self-pay

## 2022-07-28 ENCOUNTER — Encounter (HOSPITAL_COMMUNITY): Payer: Self-pay

## 2022-07-28 ENCOUNTER — Inpatient Hospital Stay (HOSPITAL_COMMUNITY)
Admission: EM | Admit: 2022-07-28 | Discharge: 2022-07-31 | DRG: 084 | Disposition: A | Discharge status: Home / Self Care | Payer: Medicare Other | Attending: Neurosurgery | Admitting: Neurosurgery

## 2022-07-28 DIAGNOSIS — W19XXXA Unspecified fall, initial encounter: Principal | ICD-10-CM

## 2022-07-28 DIAGNOSIS — Z885 Allergy status to narcotic agent status: Secondary | ICD-10-CM | POA: Diagnosis not present

## 2022-07-28 DIAGNOSIS — J4489 Other specified chronic obstructive pulmonary disease: Secondary | ICD-10-CM | POA: Diagnosis present

## 2022-07-28 DIAGNOSIS — E669 Obesity, unspecified: Secondary | ICD-10-CM | POA: Diagnosis present

## 2022-07-28 DIAGNOSIS — Z83438 Family history of other disorder of lipoprotein metabolism and other lipidemia: Secondary | ICD-10-CM | POA: Diagnosis not present

## 2022-07-28 DIAGNOSIS — S0083XA Contusion of other part of head, initial encounter: Secondary | ICD-10-CM | POA: Diagnosis present

## 2022-07-28 DIAGNOSIS — S62102A Fracture of unspecified carpal bone, left wrist, initial encounter for closed fracture: Secondary | ICD-10-CM | POA: Diagnosis present

## 2022-07-28 DIAGNOSIS — F32A Depression, unspecified: Secondary | ICD-10-CM | POA: Diagnosis present

## 2022-07-28 DIAGNOSIS — S065XAA Traumatic subdural hemorrhage with loss of consciousness status unknown, initial encounter: Secondary | ICD-10-CM | POA: Diagnosis present

## 2022-07-28 DIAGNOSIS — Z66 Do not resuscitate: Secondary | ICD-10-CM | POA: Diagnosis present

## 2022-07-28 DIAGNOSIS — S62101A Fracture of unspecified carpal bone, right wrist, initial encounter for closed fracture: Secondary | ICD-10-CM

## 2022-07-28 DIAGNOSIS — Z808 Family history of malignant neoplasm of other organs or systems: Secondary | ICD-10-CM

## 2022-07-28 DIAGNOSIS — M19042 Primary osteoarthritis, left hand: Secondary | ICD-10-CM | POA: Diagnosis present

## 2022-07-28 DIAGNOSIS — F419 Anxiety disorder, unspecified: Secondary | ICD-10-CM | POA: Diagnosis present

## 2022-07-28 DIAGNOSIS — Z833 Family history of diabetes mellitus: Secondary | ICD-10-CM | POA: Diagnosis not present

## 2022-07-28 DIAGNOSIS — Z6833 Body mass index (BMI) 33.0-33.9, adult: Secondary | ICD-10-CM

## 2022-07-28 DIAGNOSIS — I10 Essential (primary) hypertension: Secondary | ICD-10-CM | POA: Diagnosis present

## 2022-07-28 DIAGNOSIS — Z96653 Presence of artificial knee joint, bilateral: Secondary | ICD-10-CM | POA: Diagnosis present

## 2022-07-28 DIAGNOSIS — W06XXXA Fall from bed, initial encounter: Secondary | ICD-10-CM | POA: Diagnosis present

## 2022-07-28 DIAGNOSIS — Z79899 Other long term (current) drug therapy: Secondary | ICD-10-CM | POA: Diagnosis not present

## 2022-07-28 DIAGNOSIS — M19041 Primary osteoarthritis, right hand: Secondary | ICD-10-CM | POA: Diagnosis present

## 2022-07-28 DIAGNOSIS — Y92003 Bedroom of unspecified non-institutional (private) residence as the place of occurrence of the external cause: Secondary | ICD-10-CM | POA: Diagnosis not present

## 2022-07-28 DIAGNOSIS — H04123 Dry eye syndrome of bilateral lacrimal glands: Secondary | ICD-10-CM | POA: Diagnosis present

## 2022-07-28 DIAGNOSIS — Z96611 Presence of right artificial shoulder joint: Secondary | ICD-10-CM | POA: Diagnosis present

## 2022-07-28 DIAGNOSIS — Z8249 Family history of ischemic heart disease and other diseases of the circulatory system: Secondary | ICD-10-CM

## 2022-07-28 LAB — CBC
HCT: 46.2 % — ABNORMAL HIGH (ref 36.0–46.0)
Hemoglobin: 15.5 g/dL — ABNORMAL HIGH (ref 12.0–15.0)
MCH: 30.9 pg (ref 26.0–34.0)
MCHC: 33.5 g/dL (ref 30.0–36.0)
MCV: 92 fL (ref 80.0–100.0)
Platelets: 235 10*3/uL (ref 150–400)
RBC: 5.02 MIL/uL (ref 3.87–5.11)
RDW: 13.6 % (ref 11.5–15.5)
WBC: 12.5 10*3/uL — ABNORMAL HIGH (ref 4.0–10.5)
nRBC: 0 % (ref 0.0–0.2)

## 2022-07-28 LAB — COMPREHENSIVE METABOLIC PANEL
ALT: 17 U/L (ref 0–44)
AST: 23 U/L (ref 15–41)
Albumin: 4.7 g/dL (ref 3.5–5.0)
Alkaline Phosphatase: 92 U/L (ref 38–126)
Anion gap: 13 (ref 5–15)
BUN: 8 mg/dL (ref 8–23)
CO2: 26 mmol/L (ref 22–32)
Calcium: 9.2 mg/dL (ref 8.9–10.3)
Chloride: 98 mmol/L (ref 98–111)
Creatinine, Ser: 0.57 mg/dL (ref 0.44–1.00)
GFR, Estimated: >60 mL/min (ref >60)
Glucose, Bld: 144 mg/dL — ABNORMAL HIGH (ref 70–99)
Potassium: 3.5 mmol/L (ref 3.5–5.1)
Sodium: 137 mmol/L (ref 135–145)
Total Bilirubin: 1.7 mg/dL — ABNORMAL HIGH (ref 0.3–1.2)
Total Protein: 6.9 g/dL (ref 6.5–8.1)

## 2022-07-28 LAB — PROTIME-INR
INR: 1 (ref 0.8–1.2)
Prothrombin Time: 13.2 seconds (ref 11.4–15.2)

## 2022-07-28 LAB — MRSA NEXT GEN BY PCR, NASAL: MRSA by PCR Next Gen: NOT DETECTED

## 2022-07-28 MED ORDER — HYDROCHLOROTHIAZIDE 12.5 MG PO TABS
12.5000 mg | ORAL_TABLET | Freq: Every day | ORAL | Status: DC
  Administered 2022-07-28 – 2022-07-31 (×4): 12.5 mg via ORAL
  Filled 2022-07-28 (×4): qty 1

## 2022-07-28 MED ORDER — FENTANYL CITRATE PF 50 MCG/ML IJ SOSY
50.0000 ug | PREFILLED_SYRINGE | Freq: Once | INTRAMUSCULAR | Status: AC
  Administered 2022-07-28: 50 ug via INTRAVENOUS
  Filled 2022-07-28: qty 1

## 2022-07-28 MED ORDER — ACETAMINOPHEN 650 MG RE SUPP: 650.0000 mg | Freq: Four times a day (QID) | RECTAL | Status: DC | PRN

## 2022-07-28 MED ORDER — ALBUTEROL SULFATE (2.5 MG/3ML) 0.083% IN NEBU: 2.5000 mg | INHALATION_SOLUTION | Freq: Four times a day (QID) | RESPIRATORY_TRACT | Status: DC | PRN

## 2022-07-28 MED ORDER — LORATADINE 10 MG PO TABS
10.0000 mg | ORAL_TABLET | Freq: Every day | ORAL | Status: DC
  Administered 2022-07-28 – 2022-07-31 (×4): 10 mg via ORAL
  Filled 2022-07-28 (×4): qty 1

## 2022-07-28 MED ORDER — FLUTICASONE PROPIONATE 50 MCG/ACT NA SUSP
2.0000 | Freq: Every day | NASAL | Status: DC
  Administered 2022-07-28 – 2022-07-30 (×3): 2 via NASAL
  Filled 2022-07-28: qty 16

## 2022-07-28 MED ORDER — ONDANSETRON HCL 4 MG PO TABS: 4.0000 mg | ORAL_TABLET | Freq: Four times a day (QID) | ORAL | Status: DC | PRN

## 2022-07-28 MED ORDER — DILTIAZEM HCL ER 180 MG PO TB24
180.0000 mg | ORAL_TABLET | Freq: Every day | ORAL | Status: DC
  Filled 2022-07-28: qty 1

## 2022-07-28 MED ORDER — SODIUM CHLORIDE 0.9 % IV SOLN: INTRAVENOUS | Status: DC

## 2022-07-28 MED ORDER — POLYETHYL GLYCOL-PROPYL GLYCOL 0.4-0.3 % OP SOLN: 1.0000 [drp] | Freq: Four times a day (QID) | OPHTHALMIC | Status: DC | PRN

## 2022-07-28 MED ORDER — ONDANSETRON HCL 4 MG/2ML IJ SOLN
4.0000 mg | Freq: Once | INTRAMUSCULAR | Status: AC
  Administered 2022-07-28: 4 mg via INTRAVENOUS
  Filled 2022-07-28: qty 2

## 2022-07-28 MED ORDER — ACETAMINOPHEN 325 MG PO TABS
650.0000 mg | ORAL_TABLET | Freq: Four times a day (QID) | ORAL | Status: DC | PRN
  Administered 2022-07-29 – 2022-07-30 (×2): 650 mg via ORAL
  Filled 2022-07-28 (×2): qty 2

## 2022-07-28 MED ORDER — VITAMIN D 25 MCG (1000 UNIT) PO TABS
5000.0000 [IU] | ORAL_TABLET | Freq: Every day | ORAL | Status: DC
  Administered 2022-07-28 – 2022-07-30 (×3): 5000 [IU] via ORAL
  Filled 2022-07-28 (×3): qty 5

## 2022-07-28 MED ORDER — SULFASALAZINE 500 MG PO TABS
1000.0000 mg | ORAL_TABLET | Freq: Two times a day (BID) | ORAL | Status: DC
  Administered 2022-07-28 – 2022-07-31 (×6): 1000 mg via ORAL
  Filled 2022-07-28 (×8): qty 2

## 2022-07-28 MED ORDER — ORAL CARE MOUTH RINSE: 15.0000 mL | OROMUCOSAL | Status: DC | PRN

## 2022-07-28 MED ORDER — ALPRAZOLAM 0.25 MG PO TABS
0.2500 mg | ORAL_TABLET | Freq: Two times a day (BID) | ORAL | Status: DC | PRN
  Administered 2022-07-29 – 2022-07-30 (×2): 0.25 mg via ORAL
  Filled 2022-07-28 (×2): qty 1

## 2022-07-28 MED ORDER — DILTIAZEM HCL ER COATED BEADS 180 MG PO CP24
180.0000 mg | ORAL_CAPSULE | Freq: Every day | ORAL | Status: DC
  Administered 2022-07-28 – 2022-07-31 (×4): 180 mg via ORAL
  Filled 2022-07-28 (×4): qty 1

## 2022-07-28 MED ORDER — HYDROXYZINE HCL 25 MG PO TABS: 25.0000 mg | ORAL_TABLET | Freq: Three times a day (TID) | ORAL | Status: DC | PRN

## 2022-07-28 MED ORDER — FOLIC ACID 1 MG PO TABS
1.0000 mg | ORAL_TABLET | Freq: Every day | ORAL | Status: DC
  Administered 2022-07-28 – 2022-07-30 (×3): 1 mg via ORAL
  Filled 2022-07-28 (×3): qty 1

## 2022-07-28 MED ORDER — FLUTICASONE FUROATE 100 MCG/ACT IN AEPB: 1.0000 | INHALATION_SPRAY | Freq: Every day | RESPIRATORY_TRACT | Status: DC

## 2022-07-28 MED ORDER — ONDANSETRON HCL 4 MG/2ML IJ SOLN
4.0000 mg | Freq: Four times a day (QID) | INTRAMUSCULAR | Status: DC | PRN
  Administered 2022-07-29 (×2): 4 mg via INTRAVENOUS
  Filled 2022-07-28 (×3): qty 2

## 2022-07-28 MED ORDER — HYDROCODONE-ACETAMINOPHEN 5-325 MG PO TABS
1.0000 | ORAL_TABLET | ORAL | Status: DC | PRN
  Administered 2022-07-28 – 2022-07-29 (×4): 2 via ORAL
  Administered 2022-07-30: 1 via ORAL
  Administered 2022-07-30: 2 via ORAL
  Filled 2022-07-28: qty 1
  Filled 2022-07-28 (×5): qty 2

## 2022-07-28 MED ORDER — DULOXETINE HCL 60 MG PO CPEP
60.0000 mg | ORAL_CAPSULE | Freq: Every day | ORAL | Status: DC
  Administered 2022-07-28 – 2022-07-31 (×4): 60 mg via ORAL
  Filled 2022-07-28: qty 2
  Filled 2022-07-28 (×3): qty 1

## 2022-07-28 MED ORDER — BUDESONIDE 0.25 MG/2ML IN SUSP
0.2500 mg | Freq: Two times a day (BID) | RESPIRATORY_TRACT | Status: DC
  Administered 2022-07-28 – 2022-07-31 (×6): 0.25 mg via RESPIRATORY_TRACT
  Filled 2022-07-28 (×6): qty 2

## 2022-07-28 MED ORDER — CHLORHEXIDINE GLUCONATE CLOTH 2 % EX PADS
6.0000 | MEDICATED_PAD | Freq: Every day | CUTANEOUS | Status: DC
  Administered 2022-07-28 – 2022-07-29 (×2): 6 via TOPICAL

## 2022-07-28 MED ORDER — HYDROMORPHONE HCL 1 MG/ML IJ SOLN: 0.5000 mg | INTRAMUSCULAR | Status: DC | PRN

## 2022-07-28 MED ORDER — POLYVINYL ALCOHOL 1.4 % OP SOLN
1.0000 [drp] | OPHTHALMIC | Status: DC | PRN
  Filled 2022-07-28: qty 15

## 2022-07-28 MED ORDER — ATORVASTATIN CALCIUM 10 MG PO TABS
20.0000 mg | ORAL_TABLET | Freq: Every day | ORAL | Status: DC
  Administered 2022-07-28 – 2022-07-30 (×3): 20 mg via ORAL
  Filled 2022-07-28 (×3): qty 2

## 2022-07-28 NOTE — H&P (Signed)
Tasha Hunt is an 77 y.o. female.   Chief Complaint: Status post fall HPI: 77 year old female who apparently fell out of bed earlier this morning.  Patient states that she got back in bed but awakened with left wrist pain and headache.  She noted that she had a large amount of bruising around the right side of her eye.  She drove herself to orthopedic urgent care where she was found to have a significant left wrist fracture which was splinted.  They recommended emergency department evaluation for her ongoing headache.  Workup is demonstrated evidence of an acute right-sided convexity subdural hematoma with minimal mass effect.  The patient reports some continued headache.  She denies any speech or language problems.  She is having no symptoms of numbness paresthesias or weakness.  She is otherwise in good health.  Past Medical History:  Diagnosis Date   Allergic rhinitis, cause unspecified    Aneurysm of splenic artery (HCC)    noted since 2002   Anxiety    Arthritis    hands   Asthma    Complication of anesthesia    COPD (chronic obstructive pulmonary disease) (HCC)    Depressive disorder, not elsewhere classified    Dyspnea    Esophageal reflux    H/O hiatal hernia    Hypertension    Obesity, unspecified    Pneumonia    03/27/11 "many many years ago"   PONV (postoperative nausea and vomiting)    Ulcerative colitis (HCC)     Past Surgical History:  Procedure Laterality Date   ABDOMINAL HYSTERECTOMY  1980   ARTERIAL ANEURYSM REPAIR  03/27/2011   splenic artery   BREAST BIOPSY  2003   right   BREAST BIOPSY  2012   left   BREAST LUMPECTOMY WITH RADIOACTIVE SEED LOCALIZATION Right 07/06/2019   Procedure: RIGHT BREAST LUMPECTOMY WITH RADIOACTIVE SEED LOCALIZATION;  Surgeon: Harriette Bouillon, MD;  Location: [facility];  Service: General;  Laterality: Right;   CATARACT EXTRACTION W/ INTRAOCULAR LENS IMPLANT Bilateral    CHOLECYSTECTOMY  2002   EMBOLIZATION  N/A 03/27/2011   Procedure: EMBOLIZATION;  Surgeon: Sherren Kerns, MD;  Location: [facility] CATH [facility];  Service: Cardiovascular;  Laterality: N/A;   HERNIA REPAIR     KNEE CLOSED REDUCTION Left 10/24/2021   Procedure: CLOSED MANIPULATION KNEE;  Surgeon: Durene Romans, MD;  Location: [facility];  Service: Orthopedics;  Laterality: Left;   LAPAROSCOPIC NISSEN FUNDOPLICATION  2002   NASAL SEPTUM SURGERY  1985   deviated septum   REVERSE SHOULDER ARTHROPLASTY Right 11/17/2019   Procedure: REVERSE SHOULDER ARTHROPLASTY;  Surgeon: Beverely Low, MD;  Location: [facility];  Service: Orthopedics;  Laterality: Right;  interscalene block   TONSILLECTOMY  1948   TOTAL KNEE ARTHROPLASTY Right 04/20/2016   Procedure: RIGHT TOTAL KNEE ARTHROPLASTY;  Surgeon: Durene Romans, MD;  Location: [facility];  Service: Orthopedics;  Laterality: Right;   TOTAL KNEE ARTHROPLASTY Left 06/19/2021    Family History  Problem Relation Age of Onset   Cancer Mother        melanoma   Diabetes Mother    Heart disease Mother    Hyperlipidemia Mother    Diabetes Father    Hypertension Father    Cancer Father        prostate   Heart disease Father    Heart attack Father    Cancer Paternal Aunt    Aneurysm Paternal Aunt        AAA   Cancer  Paternal Grandmother    Aneurysm Paternal Uncle        AAA   Social History:  reports that she has never smoked. She has never used smokeless tobacco. She reports that she does not drink alcohol and does not use drugs.  Allergies:  Allergies  Allergen Reactions   Morphine And Codeine Nausea And Vomiting    (Not in a hospital admission)   Results for orders placed or performed during the hospital encounter of 07/28/22 (from the past 48 hour(s))  Comprehensive metabolic panel     Status: Abnormal   Collection Time: 07/28/22 10:57 AM  Result Value Ref Range   Sodium 137 135 - 145 mmol/L   Potassium 3.5 3.5 - 5.1 mmol/L   Chloride 98 98 - 111 mmol/L   CO2 26 22 - 32 mmol/L   Glucose, Bld  144 (H) 70 - 99 mg/dL    Comment: Glucose reference range applies only to samples taken after fasting for at least 8 hours.   BUN 8 8 - 23 mg/dL   Creatinine, Ser 1.61 0.44 - 1.00 mg/dL   Calcium 9.2 8.9 - 09.6 mg/dL   Total Protein 6.9 6.5 - 8.1 g/dL   Albumin 4.7 3.5 - 5.0 g/dL   AST 23 15 - 41 U/L   ALT 17 0 - 44 U/L   Alkaline Phosphatase 92 38 - 126 U/L   Total Bilirubin 1.7 (H) 0.3 - 1.2 mg/dL   GFR, Estimated >04 >54 mL/min    Comment: (NOTE) Calculated using the CKD-EPI Creatinine Equation (2021)    Anion gap 13 5 - 15    Comment: Performed at [facility], [address] [place], [place] [address]  CBC     Status: Abnormal   Collection Time: 07/28/22 10:57 AM  Result Value Ref Range   WBC 12.5 (H) 4.0 - 10.5 K/uL   RBC 5.02 3.87 - 5.11 MIL/uL   Hemoglobin 15.5 (H) 12.0 - 15.0 g/dL   HCT 91.4 (H) 78.2 - 95.6 %   MCV 92.0 80.0 - 100.0 fL   MCH 30.9 26.0 - 34.0 pg   MCHC 33.5 30.0 - 36.0 g/dL   RDW 21.3 08.6 - 57.8 %   Platelets 235 150 - 400 K/uL   nRBC 0.0 0.0 - 0.2 %    Comment: Performed at [facility], [address], [place], [place] [address]  Protime-INR     Status: None   Collection Time: 07/28/22 10:57 AM  Result Value Ref Range   Prothrombin Time 13.2 11.4 - 15.2 seconds   INR 1.0 0.8 - 1.2    Comment: (NOTE) INR goal varies based on device and disease states. Performed at [facility], [address], [place], [place] [address]    DG Wrist Complete Left  Result Date: 07/28/2022 CLINICAL DATA:  Trauma, fall EXAM: LEFT WRIST - COMPLETE 3+ VIEW COMPARISON:  None Available. FINDINGS: There is comminuted fracture in the distal radius. There is over riding of fracture fragments. AP views less than optimal due to superimposed splint. Evaluation of carpal bones is less than optimal. Severe degenerative changes are noted in first carpometacarpal joint. Degenerative changes are also noted in few metacarpophalangeal joints and interphalangeal  joint of thumb. Degenerative changes are noted in the intercarpal joints along the lateral aspect. Osteopenia is seen in bony structures. There is marked soft tissue swelling over the dorsal aspect. IMPRESSION: There is comminuted fracture is seen in distal left radius.  There is over riding of fracture fragments. Electronically Signed   By: Ernie Avena M.D.   On: 07/28/2022 11:48   CT HEAD WO CONTRAST  Result Date: 07/28/2022 CLINICAL DATA:  Head trauma, moderate-severe; Polytrauma, blunt; Facial trauma, blunt EXAM: CT HEAD WITHOUT CONTRAST CT MAXILLOFACIAL WITHOUT CONTRAST CT CERVICAL SPINE WITHOUT CONTRAST TECHNIQUE: Multidetector CT imaging of the head, cervical spine, and maxillofacial structures were performed using the standard protocol without intravenous contrast. Multiplanar CT image reconstructions of the cervical spine and maxillofacial structures were also generated. RADIATION DOSE REDUCTION: This exam was performed according to the departmental dose-optimization program which includes automated exposure control, adjustment of the mA and/or kV according to patient size and/or use of iterative reconstruction technique. COMPARISON:  CT Head 09/30/15 FINDINGS: CT HEAD FINDINGS Brain: Right cerebral convexity subdural hematoma measuring up to 9 mm with extension along the falx and the right tentorial leaflet. There is roughly 5 cm leftward midline shift. There is a chronic appearing left cerebellar infarct. No hydrocephalus. There is downward migration of the cerebellar tonsils, which is nonspecific, but could be seen in the setting of intracranial hypotension. Vascular: No hyperdense vessel or unexpected calcification. Skull: Soft hematoma in the periorbital soft tissues on the right Other: None. CT MAXILLOFACIAL FINDINGS Osseous: No fracture or mandibular dislocation. No destructive process. Orbits: Negative. No traumatic or inflammatory finding. Sinuses: No middle ear or mastoid effusion.  Paranasal sinuses are clear. Bilateral lens replacement. Orbits are otherwise unremarkable. Soft tissues: Negative. CT CERVICAL SPINE FINDINGS Alignment: Normal. Skull base and vertebrae: No acute fracture. No primary bone lesion or focal pathologic process. Soft tissues and spinal canal: No prevertebral fluid or swelling. No visible canal hematoma. Disc levels:  No evidence of high-grade spinal canal stenosis. Upper chest: Negative. Other: No IMPRESSION: 1. Right cerebral convexity subdural hematoma measuring up to 9 mm with extension along the falx and the right tentorial leaflet. There is roughly 5 mm leftward midline shift. 2. Downward migration of the cerebellar tonsils, which is nonspecific, but could be seen in the setting of intracranial hypotension. 3. No acute facial bone fracture. 4. No acute cervical spine fracture. Electronically Signed   By: Lorenza Cambridge M.D.   On: 07/28/2022 11:42   CT MAXILLOFACIAL WO CONTRAST  Result Date: 07/28/2022 CLINICAL DATA:  Head trauma, moderate-severe; Polytrauma, blunt; Facial trauma, blunt EXAM: CT HEAD WITHOUT CONTRAST CT MAXILLOFACIAL WITHOUT CONTRAST CT CERVICAL SPINE WITHOUT CONTRAST TECHNIQUE: Multidetector CT imaging of the head, cervical spine, and maxillofacial structures were performed using the standard protocol without intravenous contrast. Multiplanar CT image reconstructions of the cervical spine and maxillofacial structures were also generated. RADIATION DOSE REDUCTION: This exam was performed according to the departmental dose-optimization program which includes automated exposure control, adjustment of the mA and/or kV according to patient size and/or use of iterative reconstruction technique. COMPARISON:  CT Head 09/30/15 FINDINGS: CT HEAD FINDINGS Brain: Right cerebral convexity subdural hematoma measuring up to 9 mm with extension along the falx and the right tentorial leaflet. There is roughly 5 cm leftward midline shift. There is a chronic  appearing left cerebellar infarct. No hydrocephalus. There is downward migration of the cerebellar tonsils, which is nonspecific, but could be seen in the setting of intracranial hypotension. Vascular: No hyperdense vessel or unexpected calcification. Skull: Soft hematoma in the periorbital soft tissues on the right Other: None. CT MAXILLOFACIAL FINDINGS Osseous: No fracture or mandibular dislocation. No destructive process. Orbits: Negative. No traumatic or inflammatory finding. Sinuses: No middle ear or mastoid  effusion. Paranasal sinuses are clear. Bilateral lens replacement. Orbits are otherwise unremarkable. Soft tissues: Negative. CT CERVICAL SPINE FINDINGS Alignment: Normal. Skull base and vertebrae: No acute fracture. No primary bone lesion or focal pathologic process. Soft tissues and spinal canal: No prevertebral fluid or swelling. No visible canal hematoma. Disc levels:  No evidence of high-grade spinal canal stenosis. Upper chest: Negative. Other: No IMPRESSION: 1. Right cerebral convexity subdural hematoma measuring up to 9 mm with extension along the falx and the right tentorial leaflet. There is roughly 5 mm leftward midline shift. 2. Downward migration of the cerebellar tonsils, which is nonspecific, but could be seen in the setting of intracranial hypotension. 3. No acute facial bone fracture. 4. No acute cervical spine fracture. Electronically Signed   By: Lorenza Cambridge M.D.   On: 07/28/2022 11:42   CT CERVICAL SPINE WO CONTRAST  Result Date: 07/28/2022 CLINICAL DATA:  Head trauma, moderate-severe; Polytrauma, blunt; Facial trauma, blunt EXAM: CT HEAD WITHOUT CONTRAST CT MAXILLOFACIAL WITHOUT CONTRAST CT CERVICAL SPINE WITHOUT CONTRAST TECHNIQUE: Multidetector CT imaging of the head, cervical spine, and maxillofacial structures were performed using the standard protocol without intravenous contrast. Multiplanar CT image reconstructions of the cervical spine and maxillofacial structures were also  generated. RADIATION DOSE REDUCTION: This exam was performed according to the departmental dose-optimization program which includes automated exposure control, adjustment of the mA and/or kV according to patient size and/or use of iterative reconstruction technique. COMPARISON:  CT Head 09/30/15 FINDINGS: CT HEAD FINDINGS Brain: Right cerebral convexity subdural hematoma measuring up to 9 mm with extension along the falx and the right tentorial leaflet. There is roughly 5 cm leftward midline shift. There is a chronic appearing left cerebellar infarct. No hydrocephalus. There is downward migration of the cerebellar tonsils, which is nonspecific, but could be seen in the setting of intracranial hypotension. Vascular: No hyperdense vessel or unexpected calcification. Skull: Soft hematoma in the periorbital soft tissues on the right Other: None. CT MAXILLOFACIAL FINDINGS Osseous: No fracture or mandibular dislocation. No destructive process. Orbits: Negative. No traumatic or inflammatory finding. Sinuses: No middle ear or mastoid effusion. Paranasal sinuses are clear. Bilateral lens replacement. Orbits are otherwise unremarkable. Soft tissues: Negative. CT CERVICAL SPINE FINDINGS Alignment: Normal. Skull base and vertebrae: No acute fracture. No primary bone lesion or focal pathologic process. Soft tissues and spinal canal: No prevertebral fluid or swelling. No visible canal hematoma. Disc levels:  No evidence of high-grade spinal canal stenosis. Upper chest: Negative. Other: No IMPRESSION: 1. Right cerebral convexity subdural hematoma measuring up to 9 mm with extension along the falx and the right tentorial leaflet. There is roughly 5 mm leftward midline shift. 2. Downward migration of the cerebellar tonsils, which is nonspecific, but could be seen in the setting of intracranial hypotension. 3. No acute facial bone fracture. 4. No acute cervical spine fracture. Electronically Signed   By: Lorenza Cambridge M.D.   On:  07/28/2022 11:42    Pertinent items noted in HPI and remainder of comprehensive ROS otherwise negative.  Blood pressure (!) 164/86, pulse 92, temperature 97.8 F (36.6 C), temperature source Oral, resp. rate (!) 23, height 5\' 2"  (1.575 m), weight 81.6 kg, SpO2 95 %.  Patient is awake and alert.  She is oriented and appropriate.  Speech is fluent.  Judgment and insight are intact.  Cranial nerve function normal bilateral.  Motor examination 5/5 bilaterally.  No pronator drift.  Chest and abdomen appear benign.  Extremities with the left wrist splint in place  but otherwise normal.  Head ears nose and throat demonstrates no evidence of bony abnormality.  There is evidence of bruising and ecchymosis around her right periorbital region.  Oropharynx, nasopharynx and external auditory canals are clear. Assessment/Plan Recently small acute right convexity subdural hematoma.  Patient overall doing well at present.  Will admit for observation.  If hematoma enlarges we may have to consider surgical evacuation.  Plan ICU admit.  Follow-up head CT scan in morning.  Sherilyn Cooter A Sabree Nuon 07/28/2022, 12:07 PM

## 2022-07-28 NOTE — ED Notes (Signed)
Pts O2 was 87% on room air. RN placed pt on 2L

## 2022-07-28 NOTE — Progress Notes (Signed)
Pt arrived on the unit, A/O, VS stable, 2 L [place]. States that she has a DNR form at home, and requesting Sulfasalazine 2 tab BID. Neurosurgery called and notified. See new orders. Bed in low position, alarms are on, call bell in reach. Family updated. Belongings at the bedside.. Continue to monitor

## 2022-07-28 NOTE — Consult Note (Signed)
Reason for Consult:Left wrist fx Referring Physician: Gerhard Munch Time called: 1222 Time at bedside: 1233   Tasha Hunt is an 77 y.o. female.  HPI: Tasha Hunt fell out of bed last night. She returned to bed but when she woke up this morning had facial bruising and her wrist hurt. She went to [facility] and saw Dr. Frazier Butt who dx her with a wrist fx and splinted her. She then began to have HA and nausea and came to the ED and is being admitted with a TBI. She is RHD.  Past Medical History:  Diagnosis Date   Allergic rhinitis, cause unspecified    Aneurysm of splenic artery (HCC)    noted since 2002   Anxiety    Arthritis    hands   Asthma    Complication of anesthesia    COPD (chronic obstructive pulmonary disease) (HCC)    Depressive disorder, not elsewhere classified    Dyspnea    Esophageal reflux    H/O hiatal hernia    Hypertension    Obesity, unspecified    Pneumonia    03/27/11 "many many years ago"   PONV (postoperative nausea and vomiting)    Ulcerative colitis (HCC)     Past Surgical History:  Procedure Laterality Date   ABDOMINAL HYSTERECTOMY  1980   ARTERIAL ANEURYSM REPAIR  03/27/2011   splenic artery   BREAST BIOPSY  2003   right   BREAST BIOPSY  2012   left   BREAST LUMPECTOMY WITH RADIOACTIVE SEED LOCALIZATION Right 07/06/2019   Procedure: RIGHT BREAST LUMPECTOMY WITH RADIOACTIVE SEED LOCALIZATION;  Surgeon: Harriette Bouillon, MD;  Location: [facility];  Service: General;  Laterality: Right;   CATARACT EXTRACTION W/ INTRAOCULAR LENS IMPLANT Bilateral    CHOLECYSTECTOMY  2002   EMBOLIZATION N/A 03/27/2011   Procedure: EMBOLIZATION;  Surgeon: Sherren Kerns, MD;  Location: [facility] CATH LAB;  Service: Cardiovascular;  Laterality: N/A;   HERNIA REPAIR     KNEE CLOSED REDUCTION Left 10/24/2021   Procedure: CLOSED MANIPULATION KNEE;  Surgeon: Durene Romans, MD;  Location: [facility];  Service: Orthopedics;  Laterality: Left;   LAPAROSCOPIC  NISSEN FUNDOPLICATION  2002   NASAL SEPTUM SURGERY  1985   deviated septum   REVERSE SHOULDER ARTHROPLASTY Right 11/17/2019   Procedure: REVERSE SHOULDER ARTHROPLASTY;  Surgeon: Beverely Low, MD;  Location: [facility];  Service: Orthopedics;  Laterality: Right;  interscalene block   TONSILLECTOMY  1948   TOTAL KNEE ARTHROPLASTY Right 04/20/2016   Procedure: RIGHT TOTAL KNEE ARTHROPLASTY;  Surgeon: Durene Romans, MD;  Location: [facility];  Service: Orthopedics;  Laterality: Right;   TOTAL KNEE ARTHROPLASTY Left 06/19/2021    Family History  Problem Relation Age of Onset   Cancer Mother        melanoma   Diabetes Mother    Heart disease Mother    Hyperlipidemia Mother    Diabetes Father    Hypertension Father    Cancer Father        prostate   Heart disease Father    Heart attack Father    Cancer Paternal Aunt    Aneurysm Paternal Aunt        AAA   Cancer Paternal Grandmother    Aneurysm Paternal Uncle        AAA    Social History:  reports that she has never smoked. She has never used smokeless tobacco. She reports that she does not drink alcohol and does not use drugs.  Allergies:  Allergies  Allergen Reactions   Morphine And Codeine Nausea And Vomiting    Medications: I have reviewed the patient's current medications.  Results for orders placed or performed during the hospital encounter of 07/28/22 (from the past 48 hour(s))  Comprehensive metabolic panel     Status: Abnormal   Collection Time: 07/28/22 10:57 AM  Result Value Ref Range   Sodium 137 135 - 145 mmol/L   Potassium 3.5 3.5 - 5.1 mmol/L   Chloride 98 98 - 111 mmol/L   CO2 26 22 - 32 mmol/L   Glucose, Bld 144 (H) 70 - 99 mg/dL    Comment: Glucose reference range applies only to samples taken after fasting for at least 8 hours.   BUN 8 8 - 23 mg/dL   Creatinine, Ser 1.61 0.44 - 1.00 mg/dL   Calcium 9.2 8.9 - 09.6 mg/dL   Total Protein 6.9 6.5 - 8.1 g/dL   Albumin 4.7 3.5 - 5.0 g/dL   AST 23 15 - 41 U/L    ALT 17 0 - 44 U/L   Alkaline Phosphatase 92 38 - 126 U/L   Total Bilirubin 1.7 (H) 0.3 - 1.2 mg/dL   GFR, Estimated >04 >54 mL/min    Comment: (NOTE) Calculated using the CKD-EPI Creatinine Equation (2021)    Anion gap 13 5 - 15    Comment: Performed at [facility], [address] [place], [place] [address]  CBC     Status: Abnormal   Collection Time: 07/28/22 10:57 AM  Result Value Ref Range   WBC 12.5 (H) 4.0 - 10.5 K/uL   RBC 5.02 3.87 - 5.11 MIL/uL   Hemoglobin 15.5 (H) 12.0 - 15.0 g/dL   HCT 91.4 (H) 78.2 - 95.6 %   MCV 92.0 80.0 - 100.0 fL   MCH 30.9 26.0 - 34.0 pg   MCHC 33.5 30.0 - 36.0 g/dL   RDW 21.3 08.6 - 57.8 %   Platelets 235 150 - 400 K/uL   nRBC 0.0 0.0 - 0.2 %    Comment: Performed at [facility], [address], [place], [place] [address]  Protime-INR     Status: None   Collection Time: 07/28/22 10:57 AM  Result Value Ref Range   Prothrombin Time 13.2 11.4 - 15.2 seconds   INR 1.0 0.8 - 1.2    Comment: (NOTE) INR goal varies based on device and disease states. Performed at [facility], [address], [place], [place] [address]     DG Wrist Complete Left  Result Date: 07/28/2022 CLINICAL DATA:  Trauma, fall EXAM: LEFT WRIST - COMPLETE 3+ VIEW COMPARISON:  None Available. FINDINGS: There is comminuted fracture in the distal radius. There is over riding of fracture fragments. AP views less than optimal due to superimposed splint. Evaluation of carpal bones is less than optimal. Severe degenerative changes are noted in first carpometacarpal joint. Degenerative changes are also noted in few metacarpophalangeal joints and interphalangeal joint of thumb. Degenerative changes are noted in the intercarpal joints along the lateral aspect. Osteopenia is seen in bony structures. There is marked soft tissue swelling over the dorsal aspect. IMPRESSION: There is comminuted fracture is seen in distal left radius. There is over riding of fracture fragments.  Electronically Signed   By: Ernie Avena M.D.   On: 07/28/2022 11:48   CT HEAD WO CONTRAST  Result Date: 07/28/2022 CLINICAL DATA:  Head trauma, moderate-severe; Polytrauma, blunt; Facial trauma, blunt EXAM: CT HEAD WITHOUT  CONTRAST CT MAXILLOFACIAL WITHOUT CONTRAST CT CERVICAL SPINE WITHOUT CONTRAST TECHNIQUE: Multidetector CT imaging of the head, cervical spine, and maxillofacial structures were performed using the standard protocol without intravenous contrast. Multiplanar CT image reconstructions of the cervical spine and maxillofacial structures were also generated. RADIATION DOSE REDUCTION: This exam was performed according to the departmental dose-optimization program which includes automated exposure control, adjustment of the mA and/or kV according to patient size and/or use of iterative reconstruction technique. COMPARISON:  CT Head 09/30/15 FINDINGS: CT HEAD FINDINGS Brain: Right cerebral convexity subdural hematoma measuring up to 9 mm with extension along the falx and the right tentorial leaflet. There is roughly 5 cm leftward midline shift. There is a chronic appearing left cerebellar infarct. No hydrocephalus. There is downward migration of the cerebellar tonsils, which is nonspecific, but could be seen in the setting of intracranial hypotension. Vascular: No hyperdense vessel or unexpected calcification. Skull: Soft hematoma in the periorbital soft tissues on the right Other: None. CT MAXILLOFACIAL FINDINGS Osseous: No fracture or mandibular dislocation. No destructive process. Orbits: Negative. No traumatic or inflammatory finding. Sinuses: No middle ear or mastoid effusion. Paranasal sinuses are clear. Bilateral lens replacement. Orbits are otherwise unremarkable. Soft tissues: Negative. CT CERVICAL SPINE FINDINGS Alignment: Normal. Skull base and vertebrae: No acute fracture. No primary bone lesion or focal pathologic process. Soft tissues and spinal canal: No prevertebral fluid or  swelling. No visible canal hematoma. Disc levels:  No evidence of high-grade spinal canal stenosis. Upper chest: Negative. Other: No IMPRESSION: 1. Right cerebral convexity subdural hematoma measuring up to 9 mm with extension along the falx and the right tentorial leaflet. There is roughly 5 mm leftward midline shift. 2. Downward migration of the cerebellar tonsils, which is nonspecific, but could be seen in the setting of intracranial hypotension. 3. No acute facial bone fracture. 4. No acute cervical spine fracture. Electronically Signed   By: Lorenza Cambridge M.D.   On: 07/28/2022 11:42   CT MAXILLOFACIAL WO CONTRAST  Result Date: 07/28/2022 CLINICAL DATA:  Head trauma, moderate-severe; Polytrauma, blunt; Facial trauma, blunt EXAM: CT HEAD WITHOUT CONTRAST CT MAXILLOFACIAL WITHOUT CONTRAST CT CERVICAL SPINE WITHOUT CONTRAST TECHNIQUE: Multidetector CT imaging of the head, cervical spine, and maxillofacial structures were performed using the standard protocol without intravenous contrast. Multiplanar CT image reconstructions of the cervical spine and maxillofacial structures were also generated. RADIATION DOSE REDUCTION: This exam was performed according to the departmental dose-optimization program which includes automated exposure control, adjustment of the mA and/or kV according to patient size and/or use of iterative reconstruction technique. COMPARISON:  CT Head 09/30/15 FINDINGS: CT HEAD FINDINGS Brain: Right cerebral convexity subdural hematoma measuring up to 9 mm with extension along the falx and the right tentorial leaflet. There is roughly 5 cm leftward midline shift. There is a chronic appearing left cerebellar infarct. No hydrocephalus. There is downward migration of the cerebellar tonsils, which is nonspecific, but could be seen in the setting of intracranial hypotension. Vascular: No hyperdense vessel or unexpected calcification. Skull: Soft hematoma in the periorbital soft tissues on the right  Other: None. CT MAXILLOFACIAL FINDINGS Osseous: No fracture or mandibular dislocation. No destructive process. Orbits: Negative. No traumatic or inflammatory finding. Sinuses: No middle ear or mastoid effusion. Paranasal sinuses are clear. Bilateral lens replacement. Orbits are otherwise unremarkable. Soft tissues: Negative. CT CERVICAL SPINE FINDINGS Alignment: Normal. Skull base and vertebrae: No acute fracture. No primary bone lesion or focal pathologic process. Soft tissues and spinal canal: No prevertebral fluid or swelling.  No visible canal hematoma. Disc levels:  No evidence of high-grade spinal canal stenosis. Upper chest: Negative. Other: No IMPRESSION: 1. Right cerebral convexity subdural hematoma measuring up to 9 mm with extension along the falx and the right tentorial leaflet. There is roughly 5 mm leftward midline shift. 2. Downward migration of the cerebellar tonsils, which is nonspecific, but could be seen in the setting of intracranial hypotension. 3. No acute facial bone fracture. 4. No acute cervical spine fracture. Electronically Signed   By: Lorenza Cambridge M.D.   On: 07/28/2022 11:42   CT CERVICAL SPINE WO CONTRAST  Result Date: 07/28/2022 CLINICAL DATA:  Head trauma, moderate-severe; Polytrauma, blunt; Facial trauma, blunt EXAM: CT HEAD WITHOUT CONTRAST CT MAXILLOFACIAL WITHOUT CONTRAST CT CERVICAL SPINE WITHOUT CONTRAST TECHNIQUE: Multidetector CT imaging of the head, cervical spine, and maxillofacial structures were performed using the standard protocol without intravenous contrast. Multiplanar CT image reconstructions of the cervical spine and maxillofacial structures were also generated. RADIATION DOSE REDUCTION: This exam was performed according to the departmental dose-optimization program which includes automated exposure control, adjustment of the mA and/or kV according to patient size and/or use of iterative reconstruction technique. COMPARISON:  CT Head 09/30/15 FINDINGS: CT HEAD  FINDINGS Brain: Right cerebral convexity subdural hematoma measuring up to 9 mm with extension along the falx and the right tentorial leaflet. There is roughly 5 cm leftward midline shift. There is a chronic appearing left cerebellar infarct. No hydrocephalus. There is downward migration of the cerebellar tonsils, which is nonspecific, but could be seen in the setting of intracranial hypotension. Vascular: No hyperdense vessel or unexpected calcification. Skull: Soft hematoma in the periorbital soft tissues on the right Other: None. CT MAXILLOFACIAL FINDINGS Osseous: No fracture or mandibular dislocation. No destructive process. Orbits: Negative. No traumatic or inflammatory finding. Sinuses: No middle ear or mastoid effusion. Paranasal sinuses are clear. Bilateral lens replacement. Orbits are otherwise unremarkable. Soft tissues: Negative. CT CERVICAL SPINE FINDINGS Alignment: Normal. Skull base and vertebrae: No acute fracture. No primary bone lesion or focal pathologic process. Soft tissues and spinal canal: No prevertebral fluid or swelling. No visible canal hematoma. Disc levels:  No evidence of high-grade spinal canal stenosis. Upper chest: Negative. Other: No IMPRESSION: 1. Right cerebral convexity subdural hematoma measuring up to 9 mm with extension along the falx and the right tentorial leaflet. There is roughly 5 mm leftward midline shift. 2. Downward migration of the cerebellar tonsils, which is nonspecific, but could be seen in the setting of intracranial hypotension. 3. No acute facial bone fracture. 4. No acute cervical spine fracture. Electronically Signed   By: Lorenza Cambridge M.D.   On: 07/28/2022 11:42    Review of Systems  HENT:  Negative for ear discharge, ear pain, hearing loss and tinnitus.   Eyes:  Negative for photophobia and pain.  Respiratory:  Negative for cough and shortness of breath.   Cardiovascular:  Negative for chest pain.  Gastrointestinal:  Positive for nausea. Negative for  abdominal pain and vomiting.  Genitourinary:  Negative for dysuria, flank pain, frequency and urgency.  Musculoskeletal:  Positive for arthralgias (Left wrist). Negative for back pain, myalgias and neck pain.  Neurological:  Positive for headaches. Negative for dizziness.  Hematological:  Does not bruise/bleed easily.  Psychiatric/Behavioral:  The patient is not nervous/anxious.    Blood pressure (!) 164/86, pulse 92, temperature 97.8 F (36.6 C), temperature source Oral, resp. rate (!) 23, height 5\' 2"  (1.575 m), weight 81.6 kg, SpO2 95 %. Physical Exam  Constitutional:      General: She is not in acute distress.    Appearance: She is well-developed. She is not diaphoretic.  HENT:     Head: Normocephalic and atraumatic.  Eyes:     General: No scleral icterus.       Right eye: No discharge.        Left eye: No discharge.     Conjunctiva/sclera: Conjunctivae normal.  Cardiovascular:     Rate and Rhythm: Normal rate and regular rhythm.  Pulmonary:     Effort: Pulmonary effort is normal. No respiratory distress.  Musculoskeletal:     Cervical back: Normal range of motion.     Comments: Left shoulder, elbow, wrist, digits- no skin wounds, nontender, wrist splint in place, no instability, no blocks to motion  Sens  Ax/R/M/U intact  Mot   Ax/ R/ PIN/ M/ AIN/ U intact  Fingers perfused  Skin:    General: Skin is warm and dry.  Neurological:     Mental Status: She is alert.  Psychiatric:        Mood and Affect: Mood normal.        Behavior: Behavior normal.     Assessment/Plan: Left wrist fx -- Continue splint and NWB. Ok to [employer] through elbow. F/u with Dr. Frazier Butt Monday.    Freeman Caldron, PA-C Orthopedic Surgery [phone] 07/28/2022, 12:37 PM

## 2022-07-28 NOTE — ED Provider Notes (Signed)
[place] EMERGENCY DEPARTMENT AT [facility] Provider Note   CSN: [id number] Arrival date & time: 07/28/22  1035     History  Chief Complaint  Patient presents with   Tasha Hunt is a 77 y.o. female.  HPI Patient presents as a level 2 trauma.  Seemingly the patient fell from her bed today, initially there was report patient was on a blood thinning medication. Patient notes that she fell from bed, sustaining injury to her head and to her left wrist.  She went to urgent care/orthopedics had a x-ray, soft splint and was sent here for evaluation. She denies weakness in any extremity, denies pelvis pain, neck pain, does have severe pain in the right side of her head, and left wrist.  EMS reports GCS 13.    Home Medications Prior to Admission medications   Medication Sig Start Date End Date Taking? Authorizing Provider  acetaminophen (TYLENOL) 325 MG tablet Take 650 mg by mouth every 6 (six) hours as needed for moderate pain or headache.    [provider]  albuterol (VENTOLIN HFA) 108 (90 Base) MCG/ACT inhaler Inhale 1 puff into the lungs every 6 (six) hours as needed for wheezing or shortness of breath.    [provider]  ALPRAZolam Prudy Feeler) 0.25 MG tablet Take 0.25 mg by mouth 2 (two) times daily as needed for anxiety.     [provider]  atorvastatin (LIPITOR) 20 MG tablet Take 20 mg by mouth daily.    [provider]  cetirizine (ZYRTEC) 10 MG tablet Take 10 mg by mouth daily.    [provider]  Cholecalciferol (DIALYVITE VITAMIN D 5000) 125 MCG (5000 UT) capsule Take 5,000 Units by mouth daily.    [provider]  DILT-XR 180 MG 24 hr capsule Take 180 mg by mouth daily.  01/21/15   [provider]  DULoxetine (CYMBALTA) 60 MG capsule Take 60 mg by mouth daily.    [provider]  fluticasone (FLONASE) 50 MCG/ACT nasal spray Place 2 sprays into the nose daily.    [provider]  Fluticasone Furoate (ARNUITY ELLIPTA) 100 MCG/ACT AEPB Inhale 1 Dose into the lungs daily. Patient taking differently: Inhale 1 puff into the lungs daily. 02/12/15   Kozlow, Alvira Philips, MD  folic acid (FOLVITE) 1 MG tablet Take 1 mg by mouth daily.    [provider]  hydrochlorothiazide (HYDRODIURIL) 12.5 MG tablet Take 12.5 mg by mouth daily.  02/09/15   [provider]  HYDROcodone-acetaminophen (NORCO) 5-325 MG tablet Take 1-2 tablets by mouth every 6 (six) hours as needed. 10/24/21   Durene Romans, MD  hydrOXYzine (ATARAX) 25 MG tablet Take 25 mg by mouth 3 (three) times daily as needed for anxiety. 09/26/21   [provider]  ketoconazole (NIZORAL) 2 % cream Apply 1 application topically daily as needed (fungus).    [provider]  lidocaine (LIDODERM) 5 % Place 1 patch onto the skin daily as needed (back pain). Remove & Discard patch within 12 hours or as directed by MD    [provider]  Misc Natural Products (OSTEO BI-FLEX ADV TRIPLE ST PO) Take 2 tablets by mouth daily.    [provider]  Polyethyl Glycol-Propyl Glycol (SYSTANE) 0.4-0.3 % SOLN Place 1 drop into both eyes 4 (four) times daily as needed (for dry eyes).    [provider]  sulfaSALAzine (AZULFIDINE) 500 MG tablet Take 1,000 mg by mouth 2 (  two) times daily. 09/24/21   [provider]      Allergies    Morphine and codeine    Review of Systems   Review of Systems  Unable to perform ROS: Acuity of condition    Physical Exam Updated Vital Signs BP (!) 164/86   Pulse 92   Temp 97.8 F (36.6 C) (Oral)   Resp (!) 23   Ht 5\' 2"  (1.575 m)   Wt 81.6 kg   SpO2 95%   BMI 32.92 kg/m  Physical Exam Vitals and nursing note reviewed.  Constitutional:      General: She is not in acute distress.    Appearance: She is well-developed.  HENT:     Head: Normocephalic.   Eyes:     Conjunctiva/sclera: Conjunctivae normal.  Neck:    Cardiovascular:     Rate and Rhythm: Normal rate and regular rhythm.  Pulmonary:     Effort: Pulmonary effort is normal. No respiratory distress.     Breath sounds: Normal breath sounds. No stridor.  Abdominal:     General: There is no distension.  Musculoskeletal:       Arms:  Skin:    General: Skin is warm and dry.  Neurological:     Mental Status: She is alert and oriented to person, place, and time.     Cranial Nerves: No cranial nerve deficit.  Psychiatric:        Mood and Affect: Mood normal.     ED Results / Procedures / Treatments   Labs (all labs ordered are listed, but only abnormal results are displayed) Labs Reviewed  COMPREHENSIVE METABOLIC PANEL - Abnormal; Notable for the following components:      Result Value   Glucose, Bld 144 (*)    Total Bilirubin 1.7 (*)    All other components within normal limits  CBC - Abnormal; Notable for the following components:   WBC 12.5 (*)    Hemoglobin 15.5 (*)    HCT 46.2 (*)    All other components within normal limits  PROTIME-INR    EKG None  Radiology DG Wrist Complete Left  Result Date: 07/28/2022 CLINICAL DATA:  Trauma, fall EXAM: LEFT WRIST - COMPLETE 3+ VIEW COMPARISON:  None Available. FINDINGS: There is comminuted fracture in the distal radius. There is over riding of fracture fragments. AP views less than optimal due to superimposed splint. Evaluation of carpal bones is less than optimal. Severe degenerative changes are noted in first carpometacarpal joint. Degenerative changes are also noted in few metacarpophalangeal joints and interphalangeal joint of thumb. Degenerative changes are noted in the intercarpal joints along the lateral aspect. Osteopenia is seen in bony structures. There is marked soft tissue swelling over the dorsal aspect. IMPRESSION: There is comminuted fracture is seen in distal left radius. There is over riding of fracture fragments. Electronically Signed   By: Ernie Avena M.D.    On: 07/28/2022 11:48   CT HEAD WO CONTRAST  Result Date: 07/28/2022 CLINICAL DATA:  Head trauma, moderate-severe; Polytrauma, blunt; Facial trauma, blunt EXAM: CT HEAD WITHOUT CONTRAST CT MAXILLOFACIAL WITHOUT CONTRAST CT CERVICAL SPINE WITHOUT CONTRAST TECHNIQUE: Multidetector CT imaging of the head, cervical spine, and maxillofacial structures were performed using the standard protocol without intravenous contrast. Multiplanar CT image reconstructions of the cervical spine and maxillofacial structures were also generated. RADIATION DOSE REDUCTION: This exam was performed according to the departmental dose-optimization program which includes automated exposure control, adjustment of the mA and/or kV according to patient  size and/or use of iterative reconstruction technique. COMPARISON:  CT Head 09/30/15 FINDINGS: CT HEAD FINDINGS Brain: Right cerebral convexity subdural hematoma measuring up to 9 mm with extension along the falx and the right tentorial leaflet. There is roughly 5 cm leftward midline shift. There is a chronic appearing left cerebellar infarct. No hydrocephalus. There is downward migration of the cerebellar tonsils, which is nonspecific, but could be seen in the setting of intracranial hypotension. Vascular: No hyperdense vessel or unexpected calcification. Skull: Soft hematoma in the periorbital soft tissues on the right Other: None. CT MAXILLOFACIAL FINDINGS Osseous: No fracture or mandibular dislocation. No destructive process. Orbits: Negative. No traumatic or inflammatory finding. Sinuses: No middle ear or mastoid effusion. Paranasal sinuses are clear. Bilateral lens replacement. Orbits are otherwise unremarkable. Soft tissues: Negative. CT CERVICAL SPINE FINDINGS Alignment: Normal. Skull base and vertebrae: No acute fracture. No primary bone lesion or focal pathologic process. Soft tissues and spinal canal: No prevertebral fluid or swelling. No visible canal hematoma. Disc levels:  No  evidence of high-grade spinal canal stenosis. Upper chest: Negative. Other: No IMPRESSION: 1. Right cerebral convexity subdural hematoma measuring up to 9 mm with extension along the falx and the right tentorial leaflet. There is roughly 5 mm leftward midline shift. 2. Downward migration of the cerebellar tonsils, which is nonspecific, but could be seen in the setting of intracranial hypotension. 3. No acute facial bone fracture. 4. No acute cervical spine fracture. Electronically Signed   By: Lorenza Cambridge M.D.   On: 07/28/2022 11:42   CT MAXILLOFACIAL WO CONTRAST  Result Date: 07/28/2022 CLINICAL DATA:  Head trauma, moderate-severe; Polytrauma, blunt; Facial trauma, blunt EXAM: CT HEAD WITHOUT CONTRAST CT MAXILLOFACIAL WITHOUT CONTRAST CT CERVICAL SPINE WITHOUT CONTRAST TECHNIQUE: Multidetector CT imaging of the head, cervical spine, and maxillofacial structures were performed using the standard protocol without intravenous contrast. Multiplanar CT image reconstructions of the cervical spine and maxillofacial structures were also generated. RADIATION DOSE REDUCTION: This exam was performed according to the departmental dose-optimization program which includes automated exposure control, adjustment of the mA and/or kV according to patient size and/or use of iterative reconstruction technique. COMPARISON:  CT Head 09/30/15 FINDINGS: CT HEAD FINDINGS Brain: Right cerebral convexity subdural hematoma measuring up to 9 mm with extension along the falx and the right tentorial leaflet. There is roughly 5 cm leftward midline shift. There is a chronic appearing left cerebellar infarct. No hydrocephalus. There is downward migration of the cerebellar tonsils, which is nonspecific, but could be seen in the setting of intracranial hypotension. Vascular: No hyperdense vessel or unexpected calcification. Skull: Soft hematoma in the periorbital soft tissues on the right Other: None. CT MAXILLOFACIAL FINDINGS Osseous: No  fracture or mandibular dislocation. No destructive process. Orbits: Negative. No traumatic or inflammatory finding. Sinuses: No middle ear or mastoid effusion. Paranasal sinuses are clear. Bilateral lens replacement. Orbits are otherwise unremarkable. Soft tissues: Negative. CT CERVICAL SPINE FINDINGS Alignment: Normal. Skull base and vertebrae: No acute fracture. No primary bone lesion or focal pathologic process. Soft tissues and spinal canal: No prevertebral fluid or swelling. No visible canal hematoma. Disc levels:  No evidence of high-grade spinal canal stenosis. Upper chest: Negative. Other: No IMPRESSION: 1. Right cerebral convexity subdural hematoma measuring up to 9 mm with extension along the falx and the right tentorial leaflet. There is roughly 5 mm leftward midline shift. 2. Downward migration of the cerebellar tonsils, which is nonspecific, but could be seen in the setting of intracranial hypotension. 3. No acute  facial bone fracture. 4. No acute cervical spine fracture. Electronically Signed   By: Lorenza Cambridge M.D.   On: 07/28/2022 11:42   CT CERVICAL SPINE WO CONTRAST  Result Date: 07/28/2022 CLINICAL DATA:  Head trauma, moderate-severe; Polytrauma, blunt; Facial trauma, blunt EXAM: CT HEAD WITHOUT CONTRAST CT MAXILLOFACIAL WITHOUT CONTRAST CT CERVICAL SPINE WITHOUT CONTRAST TECHNIQUE: Multidetector CT imaging of the head, cervical spine, and maxillofacial structures were performed using the standard protocol without intravenous contrast. Multiplanar CT image reconstructions of the cervical spine and maxillofacial structures were also generated. RADIATION DOSE REDUCTION: This exam was performed according to the departmental dose-optimization program which includes automated exposure control, adjustment of the mA and/or kV according to patient size and/or use of iterative reconstruction technique. COMPARISON:  CT Head 09/30/15 FINDINGS: CT HEAD FINDINGS Brain: Right cerebral convexity subdural  hematoma measuring up to 9 mm with extension along the falx and the right tentorial leaflet. There is roughly 5 cm leftward midline shift. There is a chronic appearing left cerebellar infarct. No hydrocephalus. There is downward migration of the cerebellar tonsils, which is nonspecific, but could be seen in the setting of intracranial hypotension. Vascular: No hyperdense vessel or unexpected calcification. Skull: Soft hematoma in the periorbital soft tissues on the right Other: None. CT MAXILLOFACIAL FINDINGS Osseous: No fracture or mandibular dislocation. No destructive process. Orbits: Negative. No traumatic or inflammatory finding. Sinuses: No middle ear or mastoid effusion. Paranasal sinuses are clear. Bilateral lens replacement. Orbits are otherwise unremarkable. Soft tissues: Negative. CT CERVICAL SPINE FINDINGS Alignment: Normal. Skull base and vertebrae: No acute fracture. No primary bone lesion or focal pathologic process. Soft tissues and spinal canal: No prevertebral fluid or swelling. No visible canal hematoma. Disc levels:  No evidence of high-grade spinal canal stenosis. Upper chest: Negative. Other: No IMPRESSION: 1. Right cerebral convexity subdural hematoma measuring up to 9 mm with extension along the falx and the right tentorial leaflet. There is roughly 5 mm leftward midline shift. 2. Downward migration of the cerebellar tonsils, which is nonspecific, but could be seen in the setting of intracranial hypotension. 3. No acute facial bone fracture. 4. No acute cervical spine fracture. Electronically Signed   By: Lorenza Cambridge M.D.   On: 07/28/2022 11:42    Procedures Procedures    Medications Ordered in ED Medications  fentaNYL (SUBLIMAZE) injection 50 mcg (has no administration in time range)  ondansetron (ZOFRAN) injection 4 mg (has no administration in time range)    ED Course/ Medical Decision Making/ A&P                             Medical Decision Making Patient presents after  a fall with head trauma, left wrist trauma.  Patient is distally neurovascularly unremarkable, but given her age, possible anticoagulation use, patient had advanced imaging of her head, neck, face as well as x-rays of her wrist and blood work.   Amount and/or Complexity of Data Reviewed Independent Historian: EMS External Data Reviewed: notes. Labs: ordered. Decision-making details documented in ED Course. Radiology: ordered and independent interpretation performed. Decision-making details documented in ED Course.  Risk Prescription drug management. Decision regarding hospitalization. Diagnosis or treatment significantly limited by social determinants of health.   12:07 PM Patient now accompanied by her daughter at bedside.  Have discussed the patient's case with the radiologist and neurosurgery colleagues. I also discussed patient case with orthopedic PA, patient with splint of the left wrist which she does  have comminuted fracture distal radius.  More concerning, patient found to have subdural hematoma, with midline shift.  On this repeat exam the patient's neuroexam remains unremarkable, but she continues to complain of pain, possibly worsening since arrival. Given concern for fall, head trauma with intracranial hemorrhage patient will be admitted to the neurosurgical ICU.  Dr. Dutch Quint and acknowledged patient's presentation, radiographic findings, neuroexam, and Ortho stabilization of the left wrist.        Final Clinical Impression(s) / ED Diagnoses Final diagnoses:  Fall, initial encounter  SDH (subdural hematoma) (HCC)  Closed fracture of right wrist, initial encounter  CRITICAL CARE Performed by: Gerhard Munch Total critical care time: 35 minutes Critical care time was exclusive of separately billable procedures and treating other patients. Critical care was necessary to treat or prevent imminent or life-threatening deterioration. Critical care was time spent personally by  me on the following activities: development of treatment plan with patient and/or surrogate as well as nursing, discussions with consultants, evaluation of patient's response to treatment, examination of patient, obtaining history from patient or surrogate, ordering and performing treatments and interventions, ordering and review of laboratory studies, ordering and review of radiographic studies, pulse oximetry and re-evaluation of patient's condition.    Gerhard Munch, MD 07/28/22 1239

## 2022-07-28 NOTE — ED Triage Notes (Addendum)
Pt BIB EMS due to a fall last night. Pt woke up on floor and does not remember fall. C/O nausea and dizziness. Denies blood thinner. Pt has hematoma to right eye. [id number]

## 2022-07-28 NOTE — ED Notes (Signed)
Trauma Event Note  Assisted with transport of patient from [facility] to 4N23. Patient A&Ox4, GCS 15. Assisted with 4N admission, handoff with [facility].  Jill Side Earle Troiano  Trauma Response RN  Please call TRN at [phone] for further assistance.

## 2022-07-29 ENCOUNTER — Inpatient Hospital Stay (HOSPITAL_COMMUNITY): Payer: Medicare Other

## 2022-07-29 MED ORDER — SODIUM CHLORIDE 0.9 % IV SOLN
12.5000 mg | Freq: Four times a day (QID) | INTRAVENOUS | Status: DC | PRN
  Administered 2022-07-29: 12.5 mg via INTRAVENOUS
  Filled 2022-07-29: qty 12.5

## 2022-07-29 NOTE — Progress Notes (Signed)
Bedside telemetry discontinued per order.

## 2022-07-29 NOTE — Plan of Care (Signed)
  Problem: Education: Goal: Knowledge of General Education information will improve Description: Including pain rating scale, medication(s)/side effects and non-pharmacologic comfort measures 07/29/2022 2204 by Karolee Ohs, RN Outcome: Progressing 07/29/2022 2204 by Karolee Ohs, RN Outcome: Progressing   Problem: Health Behavior/Discharge Planning: Goal: Ability to manage health-related needs will improve 07/29/2022 2204 by Karolee Ohs, RN Outcome: Progressing 07/29/2022 2204 by Karolee Ohs, RN Outcome: Progressing   Problem: Clinical Measurements: Goal: Ability to maintain clinical measurements within normal limits will improve 07/29/2022 2204 by Karolee Ohs, RN Outcome: Progressing 07/29/2022 2204 by Karolee Ohs, RN Outcome: Progressing

## 2022-07-29 NOTE — TOC CAGE-AID Note (Signed)
Transition of Care [facility]) - CAGE-AID Screening   Patient Details  Name: Tasha Hunt MRN: [id number] Date of Birth: 1945/01/21  [facility] CM/SW Contact:    Katha Hamming, RN Phone Number: 07/29/2022, 10:11 PM    CAGE-AID Screening:    Have You Ever Felt You Ought to Cut Down on Your Drinking or Drug Use?: No Have People Annoyed You By Critizing Your Drinking Or Drug Use?: No Have You Felt Bad Or Guilty About Your Drinking Or Drug Use?: No Have You Ever Had a Drink or Used Drugs First Thing In The Morning to Steady Your Nerves or to Get Rid of a Hangover?: No CAGE-AID Score: 0  Substance Abuse Education Offered: No (no hx drug/alcohol use, no resources indicated)

## 2022-07-29 NOTE — Plan of Care (Signed)

## 2022-07-29 NOTE — Progress Notes (Signed)
Received into room from 4N23 by w/c, accompanied by tech.  Oriented to room.

## 2022-07-29 NOTE — Progress Notes (Addendum)
Patient newly c/o nausea and dizziness. Neuro exam performed, patient at baseline. Bilateral pupils equal and reactive with right slightly more sluggish than left. Neuro text and made aware. Celine Mans, RN on 3W called and made aware as well.  Dr. Jordan Likes states its okay to wait till midnight for follow up head CT.

## 2022-07-29 NOTE — Progress Notes (Signed)
No new issues or problems overnight.  Patient complains of wrist pain and some headache.  No numbness paresthesias or weakness.  No nausea vomiting.  Afebrile.  Vital signs are stable.  She is awake and alert.  She is oriented and appropriate.  Her speech is fluent.  Judgment insight are intact.  Cranial nerve function normal bilateral.  Motor examination 5/5 bilaterally without pronator drift.  Sensory examination normal.  Follow-up head CT scan demonstrates stable appearance for right convexity subdural hematoma with minimal mass effect.  Overall doing reasonably well following multitrauma with significant right convexity subdural hematoma.  Subdural with small without significant mass effect present.  No plans for surgical intervention.  Okay to transfer out of unit.  Mobilize with therapy.Possible discharge over the next few days.

## 2022-07-30 ENCOUNTER — Inpatient Hospital Stay (HOSPITAL_COMMUNITY): Payer: Medicare Other

## 2022-07-30 NOTE — Progress Notes (Signed)
Providing Compassionate, Quality Care - Together   Subjective: Patient reports nausea and facial pain/headache. Her nausea has responded well to phenergan 12.5 mg. CT scan completed overnight.  Objective: Vital signs in last 24 hours: Temp:  [97.5 F (36.4 C)-98.9 F (37.2 C)] 98.2 F (36.8 C) (07/11 0728) Pulse Rate:  [78-95] 78 (07/11 0728) Resp:  [15-18] 16 (07/11 0728) BP: (125-148)/(56-75) 125/69 (07/11 0728) SpO2:  [92 %-100 %] 94 % (07/11 0753)  Intake/Output from previous day: 07/10 0701 - 07/11 0700 In: 45 [IV Piggyback:45] Out: -  Intake/Output this shift: No intake/output data recorded.  Alert and oriented x 4 PERRLA Right eye with bruising CN II-XII grossly intact MAE, Strength and sensation intact aside from left wrist, which has significant swelling and is immobilized    Lab Results: Recent Labs    07/28/22 1057  WBC 12.5*  HGB 15.5*  HCT 46.2*  PLT 235   BMET Recent Labs    07/28/22 1057  NA 137  K 3.5  CL 98  CO2 26  GLUCOSE 144*  BUN 8  CREATININE 0.57  CALCIUM 9.2    Studies/Results: CT HEAD WO CONTRAST ( )  Result Date: 07/30/2022 CLINICAL DATA:  77 year old female with right side subdural hematoma after a fall. EXAM: CT HEAD WITHOUT CONTRAST TECHNIQUE: Contiguous axial images were obtained from the base of the skull through the vertex without intravenous contrast. RADIATION DOSE REDUCTION: This exam was performed according to the departmental dose-optimization program which includes automated exposure control, adjustment of the mA and/or kV according to patient size and/or use of iterative reconstruction technique. COMPARISON:  Head CT 07/29/2022 and earlier. FINDINGS: Brain: Hyperdense, hemispheric right side subdural hematoma measures up to 9 mm in thickness add an area of lobulation along the posterior operculum (coronal image 39), stable. The blood volume elsewhere appears stable. Trace leftward midline shift is stable. Stable  mild mass effect on the right lateral ventricle with no ventriculomegaly. Basilar cisterns remain patent. No new intracranial hemorrhage. Stable gray-white matter differentiation throughout the brain. Chronic left cerebellar infarct. No cortically based acute infarct identified. Vascular: Calcified atherosclerosis at the skull base. No suspicious intracranial vascular hyperdensity. Skull: No skull fracture identified. Congenital incomplete ossification of the posterior C1 ring. Sinuses/Orbits: Visualized paranasal sinuses and mastoids are stable and well aerated. Other: Stable orbit and scalp soft tissues. IMPRESSION: 1. Stable hemispheric Right SDH, up to 9 mm in thickness. Stable mild intracranial mass effect, trace leftward midline shift. 2. No skull fracture or new intracranial abnormality identified. Electronically Signed   By: Odessa Fleming M.D.   On: 07/30/2022 06:31   CT HEAD WO CONTRAST  Result Date: 07/29/2022 CLINICAL DATA:  A subdural hematoma, follow-up EXAM: CT HEAD WITHOUT CONTRAST TECHNIQUE: Contiguous axial images were obtained from the base of the skull through the vertex without intravenous contrast. RADIATION DOSE REDUCTION: This exam was performed according to the departmental dose-optimization program which includes automated exposure control, adjustment of the mA and/or kV according to patient size and/or use of iterative reconstruction technique. COMPARISON:  07/28/2022 CT head FINDINGS: Brain: Redemonstrated right cerebral convexity subdural hematoma, which measures up to 10 mm, unchanged when remeasured similarly. Redemonstrated extension along the falx and right tentorium. 3 mm right to left midline shift, unchanged when remeasured similarly. Unchanged extension of the cerebellar tonsils past the foramen magnum, which was likely present on the 09/30/2015 CT, although incompletely imaged. No parenchymal hemorrhage, acute infarct, mass, or hydrocephalus. Vascular: No hyperdense vessel. Skull:  Negative for  fracture or focal lesion. Redemonstrated right periorbital hematoma. Sinuses/Orbits: No acute finding. Status post bilateral lens replacements. Other: The mastoid air cells are well aerated. IMPRESSION: Unchanged right cerebral convexity subdural hematoma with extension along the falx and right tentorium. Unchanged 3 mm right to left midline shift. Electronically Signed   By: Wiliam Ke M.D.   On: 07/29/2022 00:56   DG Wrist Complete Left  Result Date: 07/28/2022 CLINICAL DATA:  Trauma, fall EXAM: LEFT WRIST - COMPLETE 3+ VIEW COMPARISON:  None Available. FINDINGS: There is comminuted fracture in the distal radius. There is over riding of fracture fragments. AP views less than optimal due to superimposed splint. Evaluation of carpal bones is less than optimal. Severe degenerative changes are noted in first carpometacarpal joint. Degenerative changes are also noted in few metacarpophalangeal joints and interphalangeal joint of thumb. Degenerative changes are noted in the intercarpal joints along the lateral aspect. Osteopenia is seen in bony structures. There is marked soft tissue swelling over the dorsal aspect. IMPRESSION: There is comminuted fracture is seen in distal left radius. There is over riding of fracture fragments. Electronically Signed   By: Ernie Avena M.D.   On: 07/28/2022 11:48   CT HEAD WO CONTRAST  Result Date: 07/28/2022 CLINICAL DATA:  Head trauma, moderate-severe; Polytrauma, blunt; Facial trauma, blunt EXAM: CT HEAD WITHOUT CONTRAST CT MAXILLOFACIAL WITHOUT CONTRAST CT CERVICAL SPINE WITHOUT CONTRAST TECHNIQUE: Multidetector CT imaging of the head, cervical spine, and maxillofacial structures were performed using the standard protocol without intravenous contrast. Multiplanar CT image reconstructions of the cervical spine and maxillofacial structures were also generated. RADIATION DOSE REDUCTION: This exam was performed according to the departmental  dose-optimization program which includes automated exposure control, adjustment of the mA and/or kV according to patient size and/or use of iterative reconstruction technique. COMPARISON:  CT Head 09/30/15 FINDINGS: CT HEAD FINDINGS Brain: Right cerebral convexity subdural hematoma measuring up to 9 mm with extension along the falx and the right tentorial leaflet. There is roughly 5 cm leftward midline shift. There is a chronic appearing left cerebellar infarct. No hydrocephalus. There is downward migration of the cerebellar tonsils, which is nonspecific, but could be seen in the setting of intracranial hypotension. Vascular: No hyperdense vessel or unexpected calcification. Skull: Soft hematoma in the periorbital soft tissues on the right Other: None. CT MAXILLOFACIAL FINDINGS Osseous: No fracture or mandibular dislocation. No destructive process. Orbits: Negative. No traumatic or inflammatory finding. Sinuses: No middle ear or mastoid effusion. Paranasal sinuses are clear. Bilateral lens replacement. Orbits are otherwise unremarkable. Soft tissues: Negative. CT CERVICAL SPINE FINDINGS Alignment: Normal. Skull base and vertebrae: No acute fracture. No primary bone lesion or focal pathologic process. Soft tissues and spinal canal: No prevertebral fluid or swelling. No visible canal hematoma. Disc levels:  No evidence of high-grade spinal canal stenosis. Upper chest: Negative. Other: No IMPRESSION: 1. Right cerebral convexity subdural hematoma measuring up to 9 mm with extension along the falx and the right tentorial leaflet. There is roughly 5 mm leftward midline shift. 2. Downward migration of the cerebellar tonsils, which is nonspecific, but could be seen in the setting of intracranial hypotension. 3. No acute facial bone fracture. 4. No acute cervical spine fracture. Electronically Signed   By: Lorenza Cambridge M.D.   On: 07/28/2022 11:42   CT MAXILLOFACIAL WO CONTRAST  Result Date: 07/28/2022 CLINICAL DATA:  Head  trauma, moderate-severe; Polytrauma, blunt; Facial trauma, blunt EXAM: CT HEAD WITHOUT CONTRAST CT MAXILLOFACIAL WITHOUT CONTRAST CT CERVICAL SPINE WITHOUT CONTRAST  TECHNIQUE: Multidetector CT imaging of the head, cervical spine, and maxillofacial structures were performed using the standard protocol without intravenous contrast. Multiplanar CT image reconstructions of the cervical spine and maxillofacial structures were also generated. RADIATION DOSE REDUCTION: This exam was performed according to the departmental dose-optimization program which includes automated exposure control, adjustment of the mA and/or kV according to patient size and/or use of iterative reconstruction technique. COMPARISON:  CT Head 09/30/15 FINDINGS: CT HEAD FINDINGS Brain: Right cerebral convexity subdural hematoma measuring up to 9 mm with extension along the falx and the right tentorial leaflet. There is roughly 5 cm leftward midline shift. There is a chronic appearing left cerebellar infarct. No hydrocephalus. There is downward migration of the cerebellar tonsils, which is nonspecific, but could be seen in the setting of intracranial hypotension. Vascular: No hyperdense vessel or unexpected calcification. Skull: Soft hematoma in the periorbital soft tissues on the right Other: None. CT MAXILLOFACIAL FINDINGS Osseous: No fracture or mandibular dislocation. No destructive process. Orbits: Negative. No traumatic or inflammatory finding. Sinuses: No middle ear or mastoid effusion. Paranasal sinuses are clear. Bilateral lens replacement. Orbits are otherwise unremarkable. Soft tissues: Negative. CT CERVICAL SPINE FINDINGS Alignment: Normal. Skull base and vertebrae: No acute fracture. No primary bone lesion or focal pathologic process. Soft tissues and spinal canal: No prevertebral fluid or swelling. No visible canal hematoma. Disc levels:  No evidence of high-grade spinal canal stenosis. Upper chest: Negative. Other: No IMPRESSION: 1. Right  cerebral convexity subdural hematoma measuring up to 9 mm with extension along the falx and the right tentorial leaflet. There is roughly 5 mm leftward midline shift. 2. Downward migration of the cerebellar tonsils, which is nonspecific, but could be seen in the setting of intracranial hypotension. 3. No acute facial bone fracture. 4. No acute cervical spine fracture. Electronically Signed   By: Lorenza Cambridge M.D.   On: 07/28/2022 11:42   CT CERVICAL SPINE WO CONTRAST  Result Date: 07/28/2022 CLINICAL DATA:  Head trauma, moderate-severe; Polytrauma, blunt; Facial trauma, blunt EXAM: CT HEAD WITHOUT CONTRAST CT MAXILLOFACIAL WITHOUT CONTRAST CT CERVICAL SPINE WITHOUT CONTRAST TECHNIQUE: Multidetector CT imaging of the head, cervical spine, and maxillofacial structures were performed using the standard protocol without intravenous contrast. Multiplanar CT image reconstructions of the cervical spine and maxillofacial structures were also generated. RADIATION DOSE REDUCTION: This exam was performed according to the departmental dose-optimization program which includes automated exposure control, adjustment of the mA and/or kV according to patient size and/or use of iterative reconstruction technique. COMPARISON:  CT Head 09/30/15 FINDINGS: CT HEAD FINDINGS Brain: Right cerebral convexity subdural hematoma measuring up to 9 mm with extension along the falx and the right tentorial leaflet. There is roughly 5 cm leftward midline shift. There is a chronic appearing left cerebellar infarct. No hydrocephalus. There is downward migration of the cerebellar tonsils, which is nonspecific, but could be seen in the setting of intracranial hypotension. Vascular: No hyperdense vessel or unexpected calcification. Skull: Soft hematoma in the periorbital soft tissues on the right Other: None. CT MAXILLOFACIAL FINDINGS Osseous: No fracture or mandibular dislocation. No destructive process. Orbits: Negative. No traumatic or inflammatory  finding. Sinuses: No middle ear or mastoid effusion. Paranasal sinuses are clear. Bilateral lens replacement. Orbits are otherwise unremarkable. Soft tissues: Negative. CT CERVICAL SPINE FINDINGS Alignment: Normal. Skull base and vertebrae: No acute fracture. No primary bone lesion or focal pathologic process. Soft tissues and spinal canal: No prevertebral fluid or swelling. No visible canal hematoma. Disc levels:  No evidence  of high-grade spinal canal stenosis. Upper chest: Negative. Other: No IMPRESSION: 1. Right cerebral convexity subdural hematoma measuring up to 9 mm with extension along the falx and the right tentorial leaflet. There is roughly 5 mm leftward midline shift. 2. Downward migration of the cerebellar tonsils, which is nonspecific, but could be seen in the setting of intracranial hypotension. 3. No acute facial bone fracture. 4. No acute cervical spine fracture. Electronically Signed   By: Lorenza Cambridge M.D.   On: 07/28/2022 11:42    Assessment/Plan: Patient with right SDH, up to 9 mm in thickness and mild intracranial mass effect. Follow up imaging this morning is stable and there is now trace leftward midline shift, which is decreased from 5 mm leftward midline shift on 07/28/2022.   LOS: 2 days   -Possibly discharge home tomorrow. -Mobilize as tolerated.   Val Eagle, DNP, AGNP-C Nurse Practitioner  [facility] [address], [address], [place], [place] [address] P: [phone]    F: [phone]  07/30/2022, 10:48 AM

## 2022-07-30 NOTE — Plan of Care (Signed)
  Problem: Education: Goal: Knowledge of General Education information will improve Description: Including pain rating scale, medication(s)/side effects and non-pharmacologic comfort measures 07/30/2022 0345 by Karolee Ohs, RN Outcome: Progressing 07/30/2022 0339 by Karolee Ohs, RN Outcome: Progressing 07/29/2022 2204 by Karolee Ohs, RN Outcome: Progressing 07/29/2022 2204 by Karolee Ohs, RN Outcome: Progressing   Problem: Health Behavior/Discharge Planning: Goal: Ability to manage health-related needs will improve 07/30/2022 0345 by Karolee Ohs, RN Outcome: Progressing 07/30/2022 0339 by Karolee Ohs, RN Outcome: Progressing 07/29/2022 2204 by Karolee Ohs, RN Outcome: Progressing 07/29/2022 2204 by Karolee Ohs, RN Outcome: Progressing   Problem: Clinical Measurements: Goal: Ability to maintain clinical measurements within normal limits will improve 07/30/2022 0345 by Karolee Ohs, RN Outcome: Progressing 07/30/2022 0339 by Karolee Ohs, RN Outcome: Progressing 07/29/2022 2204 by Karolee Ohs, RN Outcome: Progressing 07/29/2022 2204 by Karolee Ohs, RN Outcome: Progressing Goal: Will remain free from infection Outcome: Progressing Goal: Diagnostic test results will improve Outcome: Progressing

## 2022-07-30 NOTE — Evaluation (Addendum)
Occupational Therapy Evaluation Patient Details Name: Tasha Hunt MRN: [id number] DOB: [birth year] Today's Date: 07/30/2022   History of Present Illness Pt is a 77 y/o female admitted from orthopedic urgent care after a fall at home resulting in L wrist fx (splinted, nonoperative plan) and ongoing headache. Found to have stable R SDH. PMH: arthritis, COPD, hiatal hernia, B TKA, HTN, UC.   Clinical Impression   PTA, pt lives alone and typically Independent with ADLs, IADLs and mobility without AD. Pt presents now with edema in L hand with subsequent fine motor coordination deficits and ongoing nausea.  Overall, pt able to mobilize to/from bathroom without AD at min guard and requires no more than Min A for ADLs. Anticipate as swelling subsides that pt will quickly progress to a Modified Independent level. Anticipate no immediate OT needs but consider OP OT in coordination with orthopedic MD recs in regards to L wrist fx healing/progression.      Recommendations for follow up therapy are one component of a multi-disciplinary discharge planning process, led by the attending physician.  Recommendations may be updated based on patient status, additional functional criteria and insurance authorization.   Assistance Recommended at Discharge PRN  Patient can return home with the following Assistance with cooking/housework;Assist for transportation    Functional Status Assessment  Patient has had a recent decline in their functional status and demonstrates the ability to make significant improvements in function in a reasonable and predictable amount of time.  Equipment Recommendations  None recommended by OT    Recommendations for Other Services       Precautions / Restrictions Precautions Precautions: Fall Restrictions Weight Bearing Restrictions: Yes LUE Weight Bearing: Weight bear through elbow only      Mobility Bed Mobility Overal bed mobility: Modified Independent              General bed mobility comments: increased time    Transfers Overall transfer level: Needs assistance Equipment used: None Transfers: Sit to/from Stand Sit to Stand: Supervision                  Balance Overall balance assessment: No apparent balance deficits (not formally assessed)                                         ADL either performed or assessed with clinical judgement   ADL Overall ADL's : Needs assistance/impaired Eating/Feeding: Modified independent   Grooming: Supervision/safety;Standing;Oral care;Wash/dry face Grooming Details (indicate cue type and reason): able to stand without AD, hold toothpaste tube with L hand but difficulty squeezing tube. cued to placed toothbrush on counter and place toothpaste on it with R UE with pt able to do so without physical assist Upper Body Bathing: Supervision/ safety;Sitting   Lower Body Bathing: Supervison/ safety;Sit to/from stand   Upper Body Dressing : Minimal assistance;Sitting   Lower Body Dressing: Minimal assistance;Sit to/from stand   Toilet Transfer: Min guard;Ambulation;Regular Teacher, adult education Details (indicate cue type and reason): no AD, no LOB Toileting- Clothing Manipulation and Hygiene: Supervision/safety;Sit to/from stand;Sitting/lateral lean       Functional mobility during ADLs: Min guard General ADL Comments: Discussed assist for heavier IADLs, compensatory strategies, elevation of L UE and continued AROM within tolerance to combat edema     Vision Baseline Vision/History: 0 No visual deficits Ability to See in Adequate Light: 0 Adequate Patient Visual Report: No change  from baseline Vision Assessment?: No apparent visual deficits     Perception     Praxis      Pertinent Vitals/Pain Pain Assessment Pain Assessment: No/denies pain     Hand Dominance Right   Extremity/Trunk Assessment Upper Extremity Assessment Upper Extremity Assessment: LUE  deficits/detail LUE Deficits / Details: splinted distal forearm to mid hand. digits swollen and bruised. impaired fine motor but able to lightly hold items LUE Coordination: decreased fine motor   Lower Extremity Assessment Lower Extremity Assessment: Defer to PT evaluation   Cervical / Trunk Assessment Cervical / Trunk Assessment: Normal   Communication Communication Communication: No difficulties   Cognition Arousal/Alertness: Awake/alert Behavior During Therapy: WFL for tasks assessed/performed Overall Cognitive Status: Within Functional Limits for tasks assessed                                       General Comments       Exercises     Shoulder Instructions      Home Living Family/patient expects to be discharged to:: Private residence Living Arrangements: Alone Available Help at Discharge: Family;Available PRN/intermittently Type of Home: Other(Comment) (townhouse) Home Access: Level entry     Home Layout: Two level;Able to live on main level with bedroom/bathroom     Bathroom Shower/Tub: Producer, television/film/video: Standard     Home Equipment: Agricultural consultant (2 wheels);Cane - quad;Cane - single point;Shower seat          Prior Functioning/Environment Prior Level of Function : Independent/Modified Independent;Driving             Mobility Comments: no AD typically; did use RW vs cane after knee replacement last year. only one other fall last year (immediately after DC home s/p knee sx) ADLs Comments: Indep with ADLs, IADLs, drives and grocery shops        OT Problem List: Decreased range of motion;Decreased coordination;Impaired UE functional use;Increased edema      OT Treatment/Interventions: Therapeutic exercise;Self-care/ADL training;Energy conservation;DME and/or AE instruction;Therapeutic activities;Patient/family education    OT Goals(Current goals can be found in the care plan section) Acute Rehab OT Goals Patient  Stated Goal: decrease nausea OT Goal Formulation: With patient Time For Goal Achievement: 08/13/22 Potential to Achieve Goals: Good ADL Goals Pt Will Perform Upper Body Dressing: with modified independence;sitting Pt Will Perform Lower Body Dressing: with modified independence;sit to/from stand Pt Will Transfer to Toilet: ambulating;Independently Additional ADL Goal #1: Pt to demo at least 2 strategies to combat L UE edema  OT Frequency: Min 2X/week    Co-evaluation              AM-PAC OT "6 Clicks" Daily Activity     Outcome Measure Help from another person eating meals?: None Help from another person taking care of personal grooming?: A Little Help from another person toileting, which includes using toliet, bedpan, or urinal?: A Little Help from another person bathing (including washing, rinsing, drying)?: A Little Help from another person to put on and taking off regular upper body clothing?: A Little Help from another person to put on and taking off regular lower body clothing?: A Little 6 Click Score: 19   End of Session Equipment Utilized During Treatment: Gait belt Nurse Communication: Mobility status;Weight bearing status  Activity Tolerance: Patient tolerated treatment well Patient left: in chair;with call bell/phone within reach;with chair alarm set;Other (comment) (RT present)  OT Visit  Diagnosis: Other abnormalities of gait and mobility (R26.89)                Time: 1610-9604 OT Time Calculation (min): 32 min Charges:  OT General Charges $OT Visit: 1 Visit OT Evaluation $OT Eval Moderate Complexity: 1 Mod OT Treatments $Self Care/Home Management : 8-22 mins  Bradd Canary, OTR/L Acute Rehab Services Office: [phone]   Lorre Munroe 07/30/2022, 8:59 AM

## 2022-07-30 NOTE — Care Management Important Message (Signed)
Important Message  Patient Details  Name: Tasha Hunt MRN: [id number] Date of Birth: 01/27/1945   Medicare Important Message Given:  Yes     Sherilyn Banker 07/30/2022, 12:19 PM

## 2022-07-30 NOTE — Evaluation (Signed)
Physical Therapy Evaluation Patient Details Name: Tasha Hunt MRN: [id number] DOB: 10-07-1945 Today's Date: 07/30/2022  History of Present Illness  Pt is a 77 y/o female admitted from orthopedic urgent care after a fall at home resulting in L wrist fx (splinted, nonoperative plan) and ongoing headache. Found to have stable R SDH. PMH: arthritis, COPD, hiatal hernia, B TKA, HTN, UC.  Clinical Impression  Pt admitted with above diagnosis. Pt moving well though continues to have head pain and nausea. Pt ambulated with quad cane and min guard A for 160'. Recommend outpt PT for balance. On note, after session pt reported that she had dizziness several days before this event and does not know what happened that night, has no memory of falling, does not know if she passed out. She then relays that she felt mildly dizzy with walking today. Spoke to RN about getting orthostatic BP's after she has been down for while.  Pt currently with functional limitations due to the deficits listed below (see PT Problem List). Pt will benefit from acute skilled PT to increase their independence and safety with mobility to allow discharge.           Assistance Recommended at Discharge Intermittent Supervision/Assistance  If plan is discharge home, recommend the following:  Can travel by private vehicle  Assist for transportation;Assistance with cooking/housework;A little help with walking and/or transfers        Equipment Recommendations None recommended by PT  Recommendations for Other Services       Functional Status Assessment Patient has had a recent decline in their functional status and demonstrates the ability to make significant improvements in function in a reasonable and predictable amount of time.     Precautions / Restrictions Precautions Precautions: Fall Restrictions Weight Bearing Restrictions: Yes LUE Weight Bearing: Weight bear through elbow only      Mobility  Bed  Mobility Overal bed mobility: Modified Independent             General bed mobility comments: increased time    Transfers Overall transfer level: Needs assistance Equipment used: Quad cane Transfers: Sit to/from Stand Sit to Stand: Supervision           General transfer comment: pt safe with standing    Ambulation/Gait Ambulation/Gait assistance: Min guard Gait Distance (Feet): 160 Feet Assistive device: Quad cane Gait Pattern/deviations: Step-through pattern Gait velocity: decreased Gait velocity interpretation: <1.8 ft/sec, indicate of risk for recurrent falls   General Gait Details: cues for use of quad cane on R side. Pt with guarded gait. No LOB  Stairs            Wheelchair Mobility     Tilt Bed    Modified Rankin (Stroke Patients Only)       Balance Overall balance assessment: No apparent balance deficits (not formally assessed)                                           Pertinent Vitals/Pain Pain Assessment Pain Assessment: Faces Faces Pain Scale: Hurts little more Pain Location: head + nausea Pain Descriptors / Indicators: Headache Pain Intervention(s): Limited activity within patient's tolerance, Monitored during session, Premedicated before session    Home Living Family/patient expects to be discharged to:: Private residence Living Arrangements: Alone Available Help at Discharge: Family;Available PRN/intermittently Type of Home: Other(Comment) (townhouse) Home Access: Level entry  Home Layout: Two level;Able to live on main level with bedroom/bathroom Home Equipment: Rolling Walker (2 wheels);Cane - quad;Cane - single point;Shower seat      Prior Function Prior Level of Function : Independent/Modified Independent;Driving             Mobility Comments: no AD typically; did use RW vs cane after knee replacement last year. only one other fall last year (immediately after DC home s/p knee sx) ADLs  Comments: Indep with ADLs, IADLs, drives and grocery shops     Hand Dominance   Dominant Hand: Right    Extremity/Trunk Assessment   Upper Extremity Assessment Upper Extremity Assessment: Defer to OT evaluation    Lower Extremity Assessment Lower Extremity Assessment: Overall WFL for tasks assessed (B knee replacements)    Cervical / Trunk Assessment Cervical / Trunk Assessment: Normal  Communication   Communication: No difficulties  Cognition Arousal/Alertness: Awake/alert Behavior During Therapy: WFL for tasks assessed/performed Overall Cognitive Status: Within Functional Limits for tasks assessed                                 General Comments: pt tangential at times        General Comments General comments (skin integrity, edema, etc.): pt did not mention until PT was leaving that she had been having dizziness several days prior to this fall. In addition, she has no memory of falling, does not know if she passed out, etc. Also then admits that she was mildly dizzy while walking with therapist though she did not mention this during. Asked RN to get orthostatics later after she had been down for awhile. Pt will have daughter and grandson stay with her first few days home    Exercises     Assessment/Plan    PT Assessment Patient needs continued PT services  PT Problem List Decreased balance;Decreased mobility;Decreased activity tolerance       PT Treatment Interventions DME instruction;Gait training;Functional mobility training;Therapeutic activities;Balance training;Patient/family education    PT Goals (Current goals can be found in the Care Plan section)  Acute Rehab PT Goals Patient Stated Goal: return home to her cats PT Goal Formulation: With patient Time For Goal Achievement: 08/13/22 Potential to Achieve Goals: Good    Frequency Min 3X/week     Co-evaluation               AM-PAC PT "6 Clicks" Mobility  Outcome Measure Help  needed turning from your back to your side while in a flat bed without using bedrails?: None Help needed moving from lying on your back to sitting on the side of a flat bed without using bedrails?: None Help needed moving to and from a bed to a chair (including a wheelchair)?: None Help needed standing up from a chair using your arms (e.g., wheelchair or bedside chair)?: A Little Help needed to walk in hospital room?: A Little Help needed climbing 3-5 steps with a railing? : A Little 6 Click Score: 21    End of Session Equipment Utilized During Treatment: Gait belt Activity Tolerance: Patient tolerated treatment well Patient left: in chair;with call bell/phone within reach;with chair alarm set Nurse Communication: Mobility status;Other (comment) (check BP later) PT Visit Diagnosis: Unsteadiness on feet (R26.81);History of falling (Z91.81);Dizziness and giddiness (R42);Pain Pain - Right/Left: Right Pain - part of body:  (head)    Time: 1050-1120 PT Time Calculation (min) (ACUTE ONLY): 30 min   Charges:  PT Evaluation $PT Eval Moderate Complexity: 1 Mod PT Treatments $Gait Training: 8-22 mins PT General Charges $$ ACUTE PT VISIT: 1 Visit         Lyanne Co, PT  Acute Rehab Services Secure chat preferred Office [phone]   Lawana Chambers Sair Faulcon 07/30/2022, 12:30 PM

## 2022-07-30 NOTE — Plan of Care (Signed)
  Problem: Education: Goal: Knowledge of General Education information will improve Description: Including pain rating scale, medication(s)/side effects and non-pharmacologic comfort measures 07/30/2022 0339 by Karolee Ohs, RN Outcome: Progressing 07/29/2022 2204 by Karolee Ohs, RN Outcome: Progressing 07/29/2022 2204 by Karolee Ohs, RN Outcome: Progressing   Problem: Health Behavior/Discharge Planning: Goal: Ability to manage health-related needs will improve 07/30/2022 0339 by Karolee Ohs, RN Outcome: Progressing 07/29/2022 2204 by Karolee Ohs, RN Outcome: Progressing 07/29/2022 2204 by Karolee Ohs, RN Outcome: Progressing   Problem: Clinical Measurements: Goal: Ability to maintain clinical measurements within normal limits will improve 07/30/2022 0339 by Karolee Ohs, RN Outcome: Progressing 07/29/2022 2204 by Karolee Ohs, RN Outcome: Progressing 07/29/2022 2204 by Karolee Ohs, RN Outcome: Progressing Goal: Will remain free from infection Outcome: Progressing Goal: Diagnostic test results will improve Outcome: Progressing

## 2022-07-30 NOTE — TOC Initial Note (Signed)
Transition of Care [facility]) - Initial/Assessment Note    Patient Details  Name: Tasha Hunt MRN: [id number] Date of Birth: Apr 05, 1945  [facility]) CM/SW Contact:    Kermit Balo, RN Phone Number: 07/30/2022, 4:39 PM  Clinical Narrative:                  Pt is from home alone. She states her daughter is coming to stay with her tomorrow and her grandson will stay Saturday.  She is in a 2 level town home but can stay on the main level. She manages her own medications and denies any issues.  She was driving but family can assist.  Outpt therapy recommended. Pt states she just completed therapy. CM will f/u tomorrow to arrange outpt.  TOC following.   Expected Discharge Plan: OP Rehab Barriers to Discharge: Continued Medical Work up   Patient Goals and CMS Choice     Choice offered to / list presented to : Patient      Expected Discharge Plan and Services   Discharge Planning Services: CM Consult   Living arrangements for the past 2 months: Single Family Home                                      Prior Living Arrangements/Services Living arrangements for the past 2 months: Single Family Home Lives with:: Self Patient language and need for interpreter reviewed:: Yes Do you feel safe going back to the place where you live?: Yes          Current home services: DME (walker/ cane/ shower seat) Criminal Activity/Legal Involvement Pertinent to Current Situation/Hospitalization: No - Comment as needed  Activities of Daily Living Home Assistive Devices/Equipment: Hearing aid ADL Screening (condition at time of admission) Patient's cognitive ability adequate to safely complete daily activities?: Yes Is the patient deaf or have difficulty hearing?: Yes Does the patient have difficulty seeing, even when wearing glasses/contacts?: No Does the patient have difficulty concentrating, remembering, or making decisions?: No Patient able to express need  for assistance with ADLs?: No Does the patient have difficulty dressing or bathing?: No Independently performs ADLs?: Yes (appropriate for developmental age) Does the patient have difficulty walking or climbing stairs?: No Weakness of Legs: Both Weakness of Arms/Hands: None  Permission Sought/Granted                  Emotional Assessment Appearance:: Appears stated age Attitude/Demeanor/Rapport: Engaged Affect (typically observed): Accepting Orientation: : Oriented to Self, Oriented to Place, Oriented to  Time, Oriented to Situation   Psych Involvement: No (comment)  Admission diagnosis:  SDH (subdural hematoma) (HCC) [S06.5XAA] Fall, initial encounter [id number]] Closed fracture of right wrist, initial encounter [S62.101A] Patient Active Problem List   Diagnosis Date Noted   SDH (subdural hematoma) (HCC) 07/28/2022   S/P shoulder replacement, right 11/17/2019   S/P right TKA 04/20/2016   Aneurysm of splenic artery (HCC) 03/19/2011   PCP:  Cheron Schaumann., MD Pharmacy:   [facility] [phone] - [place], [place] - [address]  [address] Phone: [phone] Fax: [phone]  [facility] [address] [employer] [place] [address] Phone: [phone]      Social Determinants of Health (SDOH) Social History: SDOH Screenings   Food Insecurity: No Food Insecurity (07/28/2022)  Housing: Patient Declined (07/28/2022)  Transportation Needs: No Transportation Needs (07/28/2022)  Utilities: Not At Risk (07/28/2022)  Financial Resource Strain: Low Risk  (04/07/2022)   Received from [facility]  Physical Activity: Unknown (05/24/2021)   [facility] visits prior to 03/21/2022., [facility] to [facility]  Social Connections: Unknown (01/05/2022)   Received from [facility]  Stress: Stress Concern Present (05/24/2021)   [facility]  [facility] to [facility]/21/[facility]  Tobacco Use: Low Risk  (07/28/2022)   SDOH Interventions:     Readmission Risk Interventions     No data to display

## 2022-07-31 MED ORDER — HYDROCODONE-ACETAMINOPHEN 5-325 MG PO TABS: 1.0000 | ORAL_TABLET | Freq: Four times a day (QID) | ORAL | 0 refills | Status: AC | PRN

## 2022-07-31 MED ORDER — PROMETHAZINE HCL 12.5 MG PO TABS: 12.5000 mg | ORAL_TABLET | Freq: Four times a day (QID) | ORAL | 0 refills | Status: DC | PRN

## 2022-07-31 NOTE — Plan of Care (Signed)
  Problem: Education: Goal: Knowledge of General Education information will improve Description: Including pain rating scale, medication(s)/side effects and non-pharmacologic comfort measures Outcome: Progressing   Problem: Activity: Goal: Risk for activity intolerance will decrease Outcome: Progressing   Problem: Pain Managment: Goal: General experience of comfort will improve Outcome: Progressing   

## 2022-07-31 NOTE — Progress Notes (Signed)
Occupational Therapy Treatment Patient Details Name: Tasha Hunt MRN: [id number] DOB: [birth year] Today's Date: 07/31/2022   History of present illness Pt is a 77 y/o female admitted from orthopedic urgent care after a fall at home resulting in L wrist fx (splinted, nonoperative plan) and ongoing headache. Found to have stable R SDH. PMH: arthritis, COPD, hiatal hernia, B TKA, HTN, UC.   OT comments  Pt with continued progress towards OT goals. Pt able to mobilize to bathroom without AD and manage ADL tasks with increased ease. Denies any nausea or dizziness during session. L UE digits remain edematous - educated on AROM exercises, elevation and provided light squeeze sponge.    Recommendations for follow up therapy are one component of a multi-disciplinary discharge planning process, led by the attending physician.  Recommendations may be updated based on patient status, additional functional criteria and insurance authorization.    Assistance Recommended at Discharge PRN  Patient can return home with the following  Assistance with cooking/housework;Assist for transportation   Equipment Recommendations  None recommended by OT    Recommendations for Other Services      Precautions / Restrictions Precautions Precautions: Fall Restrictions Weight Bearing Restrictions: Yes LUE Weight Bearing: Weight bear through elbow only       Mobility Bed Mobility Overal bed mobility: Modified Independent                  Transfers Overall transfer level: Modified independent Equipment used: None Transfers: Sit to/from Stand Sit to Stand: Modified independent (Device/Increase time)                 Balance Overall balance assessment: No apparent balance deficits (not formally assessed)                                         ADL either performed or assessed with clinical judgement   ADL Overall ADL's : Needs assistance/impaired Eating/Feeding:  Modified independent   Grooming: Modified independent;Standing               Lower Body Dressing: Supervision/safety;Sit to/from stand;Sitting/lateral leans Lower Body Dressing Details (indicate cue type and reason): able to manage socks easily. discussed easier clothing to wear at home Toilet Transfer: Supervision/safety;Ambulation Toilet Transfer Details (indicate cue type and reason): no AD, no issues standing from reg toilet Toileting- Clothing Manipulation and Hygiene: Modified independent;Sitting/lateral lean;Sit to/from stand              Extremity/Trunk Assessment Upper Extremity Assessment Upper Extremity Assessment: LUE deficits/detail LUE Deficits / Details: splinted distal forearm to mid hand. digits swollen and bruised. impaired fine motor but able to lightly hold items; opposition impaired. provided very light resistance sponge LUE Coordination: decreased fine motor   Lower Extremity Assessment Lower Extremity Assessment: Defer to PT evaluation        Vision   Vision Assessment?: No apparent visual deficits   Perception     Praxis      Cognition Arousal/Alertness: Awake/alert Behavior During Therapy: WFL for tasks assessed/performed Overall Cognitive Status: Within Functional Limits for tasks assessed                                          Exercises      Shoulder Instructions  General Comments pt denied any dizziness during session    Pertinent Vitals/ Pain       Pain Assessment Pain Assessment: No/denies pain  Home Living                                          Prior Functioning/Environment              Frequency  Min 2X/week        Progress Toward Goals  OT Goals(current goals can now be found in the care plan section)  Progress towards OT goals: Progressing toward goals  Acute Rehab OT Goals Patient Stated Goal: home today OT Goal Formulation: With patient Time For Goal  Achievement: 08/13/22 Potential to Achieve Goals: Good ADL Goals Pt Will Perform Upper Body Dressing: with modified independence;sitting Pt Will Perform Lower Body Dressing: with modified independence;sit to/from stand Pt Will Transfer to Toilet: ambulating;Independently Additional ADL Goal #1: Pt to demo at least 2 strategies to combat L UE edema  Plan Discharge plan remains appropriate    Co-evaluation                 AM-PAC OT "6 Clicks" Daily Activity     Outcome Measure   Help from another person eating meals?: None Help from another person taking care of personal grooming?: None Help from another person toileting, which includes using toliet, bedpan, or urinal?: A Little Help from another person bathing (including washing, rinsing, drying)?: A Little Help from another person to put on and taking off regular upper body clothing?: A Little Help from another person to put on and taking off regular lower body clothing?: A Little 6 Click Score: 20    End of Session    OT Visit Diagnosis: Other abnormalities of gait and mobility (R26.89)   Activity Tolerance Patient tolerated treatment well   Patient Left in bed;with call bell/phone within reach;with bed alarm set   Nurse Communication Mobility status        Time: 1610-9604 OT Time Calculation (min): 23 min  Charges: OT General Charges $OT Visit: 1 Visit OT Treatments $Self Care/Home Management : 8-22 mins  Bradd Canary, OTR/L Acute Rehab Services Office: [phone]   Lorre Munroe 07/31/2022, 7:56 AM

## 2022-07-31 NOTE — TOC Transition Note (Signed)
Transition of Care [facility]) - CM/SW Discharge Note   Patient Details  Name: Tasha Hunt MRN: [id number] Date of Birth: [birth year]  [facility] CM/SW Contact:  Kermit Balo, RN Phone Number: 07/31/2022, 10:59 AM   Clinical Narrative:     Pt is discharging home with her daughter to assist.  Outpatient therapy recommended. Pt prefers to attend at [facility] as she just finished with them. CM has left voicemail for [facility]. Information on the AVS for patient to call and schedule first appointment.  Daughter will provide transport home.  Final next level of care: OP Rehab Barriers to Discharge: No Barriers Identified   Patient Goals and CMS Choice   Choice offered to / list presented to : Patient  Discharge Placement                         Discharge Plan and Services Additional resources added to the After Visit Summary for     Discharge Planning Services: CM Consult                                 Social Determinants of Health (SDOH) Interventions SDOH Screenings   Food Insecurity: No Food Insecurity (07/28/2022)  Housing: Patient Declined (07/28/2022)  Transportation Needs: No Transportation Needs (07/28/2022)  Utilities: Not At Risk (07/28/2022)  Financial Resource Strain: Low Risk  (04/07/2022)   Received from [facility]  Physical Activity: Unknown (05/24/2021)   [facility] prior to 03/21/2022., [facility] visits prior to [facility]21/[facility]  Social Connections: Unknown (01/05/2022)   Received from [facility]  Stress: Stress Concern Present (05/24/2021)   [facility] to [facility]  Tobacco Use: Low Risk  (07/28/2022)     Readmission Risk Interventions     No data to display

## 2022-07-31 NOTE — Progress Notes (Signed)
Discharge instructions given. Patient verbalized understanding and all questions were answered.  ?

## 2022-07-31 NOTE — Discharge Summary (Signed)
Physician Discharge Summary     Providing Compassionate, Quality Care - Together   Patient ID: Tasha Hunt MRN: [id number] DOB/AGE: 03/07/1945 77 y.o.  Admit date: 07/28/2022 Discharge date: 07/31/2022  Admission Diagnoses: SDH  Discharge Diagnoses:  Principal Problem:   SDH (subdural hematoma) [facility])   Discharged Condition: good  Hospital Course: Tasha Hunt fell out of bed the morning of 07/28/2022. She went to an ortho urgent care where she was found to have a significant left wrist fracture that was splinted. It was recommended Tasha Hunt go to the emergency department for an ongoing headache. A CT head was performed and the patient was found to have an acute right-sided convexity subdural hematoma with minimal mass effect. Follow up imaging on 7/11/224 was stable and with reduced leftward midline shift. While in the hospital, Tasha Hunt suffered with significant nausea that responded well to 12.5 mg oh promethazine. Her headaches are well-controlled with Vicodin. Outpatient physical therapy has been arranged at discharge. Tasha Hunt will stay with her daughter for a few days following discharge. She will follow up with Dr. Jordan Likes in the office in two weeks. She is ready for discharge home.  Consults: rehabilitation medicine  Significant Diagnostic Studies: radiology: CT HEAD WO CONTRAST ( )  Result Date: 07/30/2022 CLINICAL DATA:  77 year old female with right side subdural hematoma after a fall. EXAM: CT HEAD WITHOUT CONTRAST TECHNIQUE: Contiguous axial images were obtained from the base of the skull through the vertex without intravenous contrast. RADIATION DOSE REDUCTION: This exam was performed according to the departmental dose-optimization program which includes automated exposure control, adjustment of the mA and/or kV according to patient size and/or use of iterative reconstruction technique. COMPARISON:  Head CT 07/29/2022 and earlier. FINDINGS: Brain: Hyperdense,  hemispheric right side subdural hematoma measures up to 9 mm in thickness add an area of lobulation along the posterior operculum (coronal image 39), stable. The blood volume elsewhere appears stable. Trace leftward midline shift is stable. Stable mild mass effect on the right lateral ventricle with no ventriculomegaly. Basilar cisterns remain patent. No new intracranial hemorrhage. Stable gray-white matter differentiation throughout the brain. Chronic left cerebellar infarct. No cortically based acute infarct identified. Vascular: Calcified atherosclerosis at the skull base. No suspicious intracranial vascular hyperdensity. Skull: No skull fracture identified. Congenital incomplete ossification of the posterior C1 ring. Sinuses/Orbits: Visualized paranasal sinuses and mastoids are stable and well aerated. Other: Stable orbit and scalp soft tissues. IMPRESSION: 1. Stable hemispheric Right SDH, up to 9 mm in thickness. Stable mild intracranial mass effect, trace leftward midline shift. 2. No skull fracture or new intracranial abnormality identified. Electronically Signed   By: Odessa Fleming M.D.   On: 07/30/2022 06:31   CT HEAD WO CONTRAST  Result Date: 07/29/2022 CLINICAL DATA:  A subdural hematoma, follow-up EXAM: CT HEAD WITHOUT CONTRAST TECHNIQUE: Contiguous axial images were obtained from the base of the skull through the vertex without intravenous contrast. RADIATION DOSE REDUCTION: This exam was performed according to the departmental dose-optimization program which includes automated exposure control, adjustment of the mA and/or kV according to patient size and/or use of iterative reconstruction technique. COMPARISON:  07/28/2022 CT head FINDINGS: Brain: Redemonstrated right cerebral convexity subdural hematoma, which measures up to 10 mm, unchanged when remeasured similarly. Redemonstrated extension along the falx and right tentorium. 3 mm right to left midline shift, unchanged when remeasured similarly.  Unchanged extension of the cerebellar tonsils past the foramen magnum, which was likely present on the 09/30/2015 CT, although incompletely imaged. No  parenchymal hemorrhage, acute infarct, mass, or hydrocephalus. Vascular: No hyperdense vessel. Skull: Negative for fracture or focal lesion. Redemonstrated right periorbital hematoma. Sinuses/Orbits: No acute finding. Status post bilateral lens replacements. Other: The mastoid air cells are well aerated. IMPRESSION: Unchanged right cerebral convexity subdural hematoma with extension along the falx and right tentorium. Unchanged 3 mm right to left midline shift. Electronically Signed   By: Wiliam Ke M.D.   On: 07/29/2022 00:56   DG Wrist Complete Left  Result Date: 07/28/2022 CLINICAL DATA:  Trauma, fall EXAM: LEFT WRIST - COMPLETE 3+ VIEW COMPARISON:  None Available. FINDINGS: There is comminuted fracture in the distal radius. There is over riding of fracture fragments. AP views less than optimal due to superimposed splint. Evaluation of carpal bones is less than optimal. Severe degenerative changes are noted in first carpometacarpal joint. Degenerative changes are also noted in few metacarpophalangeal joints and interphalangeal joint of thumb. Degenerative changes are noted in the intercarpal joints along the lateral aspect. Osteopenia is seen in bony structures. There is marked soft tissue swelling over the dorsal aspect. IMPRESSION: There is comminuted fracture is seen in distal left radius. There is over riding of fracture fragments. Electronically Signed   By: Ernie Avena M.D.   On: 07/28/2022 11:48   CT HEAD WO CONTRAST  Result Date: 07/28/2022 CLINICAL DATA:  Head trauma, moderate-severe; Polytrauma, blunt; Facial trauma, blunt EXAM: CT HEAD WITHOUT CONTRAST CT MAXILLOFACIAL WITHOUT CONTRAST CT CERVICAL SPINE WITHOUT CONTRAST TECHNIQUE: Multidetector CT imaging of the head, cervical spine, and maxillofacial structures were performed using the  standard protocol without intravenous contrast. Multiplanar CT image reconstructions of the cervical spine and maxillofacial structures were also generated. RADIATION DOSE REDUCTION: This exam was performed according to the departmental dose-optimization program which includes automated exposure control, adjustment of the mA and/or kV according to patient size and/or use of iterative reconstruction technique. COMPARISON:  CT Head 09/30/15 FINDINGS: CT HEAD FINDINGS Brain: Right cerebral convexity subdural hematoma measuring up to 9 mm with extension along the falx and the right tentorial leaflet. There is roughly 5 cm leftward midline shift. There is a chronic appearing left cerebellar infarct. No hydrocephalus. There is downward migration of the cerebellar tonsils, which is nonspecific, but could be seen in the setting of intracranial hypotension. Vascular: No hyperdense vessel or unexpected calcification. Skull: Soft hematoma in the periorbital soft tissues on the right Other: None. CT MAXILLOFACIAL FINDINGS Osseous: No fracture or mandibular dislocation. No destructive process. Orbits: Negative. No traumatic or inflammatory finding. Sinuses: No middle ear or mastoid effusion. Paranasal sinuses are clear. Bilateral lens replacement. Orbits are otherwise unremarkable. Soft tissues: Negative. CT CERVICAL SPINE FINDINGS Alignment: Normal. Skull base and vertebrae: No acute fracture. No primary bone lesion or focal pathologic process. Soft tissues and spinal canal: No prevertebral fluid or swelling. No visible canal hematoma. Disc levels:  No evidence of high-grade spinal canal stenosis. Upper chest: Negative. Other: No IMPRESSION: 1. Right cerebral convexity subdural hematoma measuring up to 9 mm with extension along the falx and the right tentorial leaflet. There is roughly 5 mm leftward midline shift. 2. Downward migration of the cerebellar tonsils, which is nonspecific, but could be seen in the setting of  intracranial hypotension. 3. No acute facial bone fracture. 4. No acute cervical spine fracture. Electronically Signed   By: Lorenza Cambridge M.D.   On: 07/28/2022 11:42   CT MAXILLOFACIAL WO CONTRAST  Result Date: 07/28/2022 CLINICAL DATA:  Head trauma, moderate-severe; Polytrauma, blunt; Facial trauma, blunt  EXAM: CT HEAD WITHOUT CONTRAST CT MAXILLOFACIAL WITHOUT CONTRAST CT CERVICAL SPINE WITHOUT CONTRAST TECHNIQUE: Multidetector CT imaging of the head, cervical spine, and maxillofacial structures were performed using the standard protocol without intravenous contrast. Multiplanar CT image reconstructions of the cervical spine and maxillofacial structures were also generated. RADIATION DOSE REDUCTION: This exam was performed according to the departmental dose-optimization program which includes automated exposure control, adjustment of the mA and/or kV according to patient size and/or use of iterative reconstruction technique. COMPARISON:  CT Head 09/30/15 FINDINGS: CT HEAD FINDINGS Brain: Right cerebral convexity subdural hematoma measuring up to 9 mm with extension along the falx and the right tentorial leaflet. There is roughly 5 cm leftward midline shift. There is a chronic appearing left cerebellar infarct. No hydrocephalus. There is downward migration of the cerebellar tonsils, which is nonspecific, but could be seen in the setting of intracranial hypotension. Vascular: No hyperdense vessel or unexpected calcification. Skull: Soft hematoma in the periorbital soft tissues on the right Other: None. CT MAXILLOFACIAL FINDINGS Osseous: No fracture or mandibular dislocation. No destructive process. Orbits: Negative. No traumatic or inflammatory finding. Sinuses: No middle ear or mastoid effusion. Paranasal sinuses are clear. Bilateral lens replacement. Orbits are otherwise unremarkable. Soft tissues: Negative. CT CERVICAL SPINE FINDINGS Alignment: Normal. Skull base and vertebrae: No acute fracture. No primary  bone lesion or focal pathologic process. Soft tissues and spinal canal: No prevertebral fluid or swelling. No visible canal hematoma. Disc levels:  No evidence of high-grade spinal canal stenosis. Upper chest: Negative. Other: No IMPRESSION: 1. Right cerebral convexity subdural hematoma measuring up to 9 mm with extension along the falx and the right tentorial leaflet. There is roughly 5 mm leftward midline shift. 2. Downward migration of the cerebellar tonsils, which is nonspecific, but could be seen in the setting of intracranial hypotension. 3. No acute facial bone fracture. 4. No acute cervical spine fracture. Electronically Signed   By: Lorenza Cambridge M.D.   On: 07/28/2022 11:42   CT CERVICAL SPINE WO CONTRAST  Result Date: 07/28/2022 CLINICAL DATA:  Head trauma, moderate-severe; Polytrauma, blunt; Facial trauma, blunt EXAM: CT HEAD WITHOUT CONTRAST CT MAXILLOFACIAL WITHOUT CONTRAST CT CERVICAL SPINE WITHOUT CONTRAST TECHNIQUE: Multidetector CT imaging of the head, cervical spine, and maxillofacial structures were performed using the standard protocol without intravenous contrast. Multiplanar CT image reconstructions of the cervical spine and maxillofacial structures were also generated. RADIATION DOSE REDUCTION: This exam was performed according to the departmental dose-optimization program which includes automated exposure control, adjustment of the mA and/or kV according to patient size and/or use of iterative reconstruction technique. COMPARISON:  CT Head 09/30/15 FINDINGS: CT HEAD FINDINGS Brain: Right cerebral convexity subdural hematoma measuring up to 9 mm with extension along the falx and the right tentorial leaflet. There is roughly 5 cm leftward midline shift. There is a chronic appearing left cerebellar infarct. No hydrocephalus. There is downward migration of the cerebellar tonsils, which is nonspecific, but could be seen in the setting of intracranial hypotension. Vascular: No hyperdense vessel  or unexpected calcification. Skull: Soft hematoma in the periorbital soft tissues on the right Other: None. CT MAXILLOFACIAL FINDINGS Osseous: No fracture or mandibular dislocation. No destructive process. Orbits: Negative. No traumatic or inflammatory finding. Sinuses: No middle ear or mastoid effusion. Paranasal sinuses are clear. Bilateral lens replacement. Orbits are otherwise unremarkable. Soft tissues: Negative. CT CERVICAL SPINE FINDINGS Alignment: Normal. Skull base and vertebrae: No acute fracture. No primary bone lesion or focal pathologic process. Soft tissues and spinal canal:  No prevertebral fluid or swelling. No visible canal hematoma. Disc levels:  No evidence of high-grade spinal canal stenosis. Upper chest: Negative. Other: No IMPRESSION: 1. Right cerebral convexity subdural hematoma measuring up to 9 mm with extension along the falx and the right tentorial leaflet. There is roughly 5 mm leftward midline shift. 2. Downward migration of the cerebellar tonsils, which is nonspecific, but could be seen in the setting of intracranial hypotension. 3. No acute facial bone fracture. 4. No acute cervical spine fracture. Electronically Signed   By: Lorenza Cambridge M.D.   On: 07/28/2022 11:42     Treatments: Observation and pain management  Discharge Exam: Blood pressure 130/62, pulse 78, temperature 98.3 F (36.8 C), temperature source Oral, resp. rate 16, height 5\' 2"  (1.575 m), weight 84.3 kg, SpO2 90%.  Alert and oriented x 4 PERRLA Right eye with bruising CN II-XII grossly intact MAE, Strength and sensation intact aside from left wrist, which has significant swelling and is immobilized  Disposition: Discharge disposition: 01-Home or Self Care       Discharge Instructions     Call MD for:  difficulty breathing, headache or visual disturbances   Complete by: As directed    Call MD for:  persistant dizziness or light-headedness   Complete by: As directed    Call MD for:  persistant  nausea and vomiting   Complete by: As directed    Call MD for:  severe uncontrolled pain   Complete by: As directed    Increase activity slowly   Complete by: As directed       Allergies as of 07/31/2022       Reactions   Morphine And Codeine Nausea And Vomiting        Medication List     TAKE these medications    acetaminophen 325 MG tablet Commonly known as: TYLENOL Take 650 mg by mouth every 6 (six) hours as needed for moderate pain or headache.   albuterol 108 (90 Base) MCG/ACT inhaler Commonly known as: VENTOLIN HFA Inhale 1 puff into the lungs every 6 (six) hours as needed for wheezing or shortness of breath.   ALPRAZolam 0.25 MG tablet Commonly known as: XANAX Take 0.25 mg by mouth 2 (two) times daily as needed for anxiety.   atorvastatin 20 MG tablet Commonly known as: LIPITOR Take 20 mg by mouth daily.   cetirizine 10 MG tablet Commonly known as: ZYRTEC Take 10 mg by mouth daily.   Dialyvite Vitamin D 5000 125 MCG (5000 UT) capsule Generic drug: Cholecalciferol Take 5,000 Units by mouth daily.   Dilt-XR 180 MG 24 hr capsule Generic drug: diltiazem Take 180 mg by mouth daily.   DULoxetine 60 MG capsule Commonly known as: CYMBALTA Take 60 mg by mouth daily.   Flonase 50 MCG/ACT nasal spray Generic drug: fluticasone Place 2 sprays into the nose daily.   Fluticasone Furoate 100 MCG/ACT Aepb Commonly known as: Arnuity Ellipta Inhale 1 Dose into the lungs daily.   folic acid 1 MG tablet Commonly known as: FOLVITE Take 1 mg by mouth daily.   hydrochlorothiazide 12.5 MG tablet Commonly known as: HYDRODIURIL Take 12.5 mg by mouth daily.   HYDROcodone-acetaminophen 5-325 MG tablet Commonly known as: NORCO/VICODIN Take 1-2 tablets by mouth every 6 (six) hours as needed for moderate pain.   ketoconazole 2 % cream Commonly known as: NIZORAL Apply 1 application topically daily as needed (fungus).   OSTEO BI-FLEX ADV TRIPLE ST PO Take 2 tablets  by mouth  daily.   promethazine 12.5 MG tablet Commonly known as: PHENERGAN Take 1 tablet (12.5 mg total) by mouth every 6 (six) hours as needed for nausea or vomiting.   Salonpas 3.01-25-08 % Ptch Generic drug: Camphor-Menthol-Methyl Sal Apply 1 patch topically daily.   sulfaSALAzine 500 MG tablet Commonly known as: AZULFIDINE Take 1,000 mg by mouth 2 (two) times daily.   Systane 0.4-0.3 % Soln Generic drug: Polyethyl Glycol-Propyl Glycol Place 1 drop into both eyes 4 (four) times daily as needed (for dry eyes).   traMADol 50 MG tablet Commonly known as: ULTRAM Take 50 mg by mouth every 6 (six) hours as needed for moderate pain. Use as needed for moderate pain.        Follow-up Information     Velazquez, Vira Browns., MD Follow up.   Specialty: Internal Medicine Contact information: [address] [place] [address] [phone]         Julio Sicks, MD. Schedule an appointment as soon as possible for a visit in 2 week(s).   Specialty: Neurosurgery Contact information: [address] [place] [address] [phone]                 Signed: Val Eagle, DNP, AGNP-C Nurse Practitioner  [facility] [address], [place], [place] [address] P: [phone]    F: [phone]  07/31/2022, 10:31 AM

## 2022-08-03 ENCOUNTER — Ambulatory Visit: Payer: Medicare Other

## 2022-08-11 ENCOUNTER — Other Ambulatory Visit (HOSPITAL_BASED_OUTPATIENT_CLINIC_OR_DEPARTMENT_OTHER): Payer: Self-pay | Admitting: Neurological Surgery

## 2022-08-11 DIAGNOSIS — S065XAA Traumatic subdural hemorrhage with loss of consciousness status unknown, initial encounter: Secondary | ICD-10-CM

## 2022-08-12 ENCOUNTER — Ambulatory Visit (HOSPITAL_BASED_OUTPATIENT_CLINIC_OR_DEPARTMENT_OTHER)
Admission: RE | Admit: 2022-08-12 | Discharge: 2022-08-12 | Disposition: A | Discharge status: Home / Self Care | Payer: Medicare Other | Source: Ambulatory Visit | Attending: Neurological Surgery | Admitting: Neurological Surgery

## 2022-08-12 DIAGNOSIS — S065XAA Traumatic subdural hemorrhage with loss of consciousness status unknown, initial encounter: Secondary | ICD-10-CM | POA: Diagnosis not present

## 2022-08-18 ENCOUNTER — Ambulatory Visit: Payer: Medicare Other

## 2022-09-03 ENCOUNTER — Other Ambulatory Visit: Payer: Self-pay | Admitting: Neurological Surgery

## 2022-09-03 DIAGNOSIS — S065XAA Traumatic subdural hemorrhage with loss of consciousness status unknown, initial encounter: Secondary | ICD-10-CM

## 2022-09-22 ENCOUNTER — Ambulatory Visit: Payer: Medicare Other

## 2022-09-24 ENCOUNTER — Ambulatory Visit
Admission: RE | Admit: 2022-09-24 | Discharge: 2022-09-24 | Disposition: A | Discharge status: Home / Self Care | Payer: Medicare Other | Source: Ambulatory Visit | Attending: Neurological Surgery | Admitting: Neurological Surgery

## 2022-09-24 DIAGNOSIS — S065XAA Traumatic subdural hemorrhage with loss of consciousness status unknown, initial encounter: Secondary | ICD-10-CM

## 2022-10-16 ENCOUNTER — Ambulatory Visit
Admission: RE | Admit: 2022-10-16 | Discharge: 2022-10-16 | Disposition: A | Discharge status: Home / Self Care | Payer: Medicare Other | Source: Ambulatory Visit | Attending: Internal Medicine | Admitting: Internal Medicine

## 2022-10-16 DIAGNOSIS — Z1231 Encounter for screening mammogram for malignant neoplasm of breast: Secondary | ICD-10-CM

## 2022-11-12 ENCOUNTER — Emergency Department (HOSPITAL_BASED_OUTPATIENT_CLINIC_OR_DEPARTMENT_OTHER): Payer: Medicare Other

## 2022-11-12 ENCOUNTER — Encounter (HOSPITAL_BASED_OUTPATIENT_CLINIC_OR_DEPARTMENT_OTHER): Payer: Self-pay | Admitting: Emergency Medicine

## 2022-11-12 ENCOUNTER — Other Ambulatory Visit: Payer: Self-pay

## 2022-11-12 ENCOUNTER — Emergency Department (HOSPITAL_BASED_OUTPATIENT_CLINIC_OR_DEPARTMENT_OTHER)
Admission: EM | Admit: 2022-11-12 | Discharge: 2022-11-12 | Disposition: A | Discharge status: Home / Self Care | Payer: Medicare Other | Attending: Emergency Medicine | Admitting: Emergency Medicine

## 2022-11-12 DIAGNOSIS — M4802 Spinal stenosis, cervical region: Secondary | ICD-10-CM | POA: Diagnosis not present

## 2022-11-12 DIAGNOSIS — W1830XA Fall on same level, unspecified, initial encounter: Secondary | ICD-10-CM | POA: Diagnosis not present

## 2022-11-12 DIAGNOSIS — S39012A Strain of muscle, fascia and tendon of lower back, initial encounter: Secondary | ICD-10-CM | POA: Diagnosis not present

## 2022-11-12 DIAGNOSIS — M5030 Other cervical disc degeneration, unspecified cervical region: Secondary | ICD-10-CM | POA: Insufficient documentation

## 2022-11-12 DIAGNOSIS — W19XXXA Unspecified fall, initial encounter: Secondary | ICD-10-CM

## 2022-11-12 DIAGNOSIS — M545 Low back pain, unspecified: Secondary | ICD-10-CM | POA: Diagnosis present

## 2022-11-12 DIAGNOSIS — K5190 Ulcerative colitis, unspecified, without complications: Secondary | ICD-10-CM | POA: Insufficient documentation

## 2022-11-12 DIAGNOSIS — Z79899 Other long term (current) drug therapy: Secondary | ICD-10-CM | POA: Insufficient documentation

## 2022-11-12 DIAGNOSIS — S0990XA Unspecified injury of head, initial encounter: Secondary | ICD-10-CM | POA: Diagnosis present

## 2022-11-12 DIAGNOSIS — I10 Essential (primary) hypertension: Secondary | ICD-10-CM | POA: Insufficient documentation

## 2022-11-12 DIAGNOSIS — M62830 Muscle spasm of back: Secondary | ICD-10-CM | POA: Diagnosis present

## 2022-11-12 MED ORDER — CYCLOBENZAPRINE HCL 10 MG PO TABS: 5.0000 mg | ORAL_TABLET | Freq: Two times a day (BID) | ORAL | 0 refills | Status: DC | PRN

## 2022-11-12 MED ORDER — ACETAMINOPHEN 500 MG PO TABS
1000.0000 mg | ORAL_TABLET | Freq: Once | ORAL | Status: AC
  Administered 2022-11-12: 1000 mg via ORAL
  Filled 2022-11-12: qty 2

## 2022-11-12 MED ORDER — CYCLOBENZAPRINE HCL 10 MG PO TABS
10.0000 mg | ORAL_TABLET | Freq: Once | ORAL | Status: AC
  Administered 2022-11-12: 10 mg via ORAL
  Filled 2022-11-12: qty 1

## 2022-11-12 MED ORDER — LIDOCAINE 5 % EX PTCH
1.0000 | MEDICATED_PATCH | Freq: Once | CUTANEOUS | Status: DC
  Administered 2022-11-12: 1 via TRANSDERMAL
  Filled 2022-11-12: qty 1

## 2022-11-12 NOTE — ED Triage Notes (Signed)
Pt bent over to feed her cat and when she stood up she had a back spasm and fell backwards.  Pt hit her head and lower back.

## 2022-11-12 NOTE — Discharge Instructions (Addendum)
Narrative  CLINICAL DATA:  Head trauma, minor (Age >= 65y); Neck trauma (Age >=  65y)    EXAM:  CT HEAD WITHOUT CONTRAST    CT CERVICAL SPINE WITHOUT CONTRAST    TECHNIQUE:  Multidetector CT imaging of the head and cervical spine was  performed following the standard protocol without intravenous  contrast. Multiplanar CT image reconstructions of the cervical spine  were also generated.    RADIATION DOSE REDUCTION: This exam was performed according to the  departmental dose-optimization program which includes automated  exposure control, adjustment of the mA and/or kV according to  patient size and/or use of iterative reconstruction technique.    COMPARISON:  09/24/2022    FINDINGS:  CT HEAD FINDINGS    Brain: Normal anatomic configuration. Parenchymal volume loss is  commensurate with the patient's age. Stable mild periventricular  white matter changes are present likely reflecting the sequela of  small vessel ischemia. Remote right frontal periventricular and left  cerebellar infarcts are again noted. No abnormal intra or  extra-axial mass lesion or fluid collection. No abnormal mass effect  or midline shift. No evidence of acute intracranial hemorrhage or  infarct. Ventricular size is normal. Cerebellum unremarkable.    Vascular: No asymmetric hyperdense vasculature at the skull base.    Skull: Intact    Sinuses/Orbits: Left maxillary antrostomy and partial ethmoidectomy  has been performed. Moderate mucosal thickening within the left  maxillary sinus. Mucous retention cyst noted within the left  sphenoid sinus. No air-fluid levels. Remaining paranasal sinuses are  clear. Orbits are unremarkable.    Other: Mastoid air cells and middle ear cavities are clear.    CT CERVICAL SPINE FINDINGS    Alignment: Normal.    Skull base and vertebrae: Craniocervical alignment is normal. The  atlantodental interval is not widened. No acute fracture of the  cervical spine.  Vertebral body height has been preserved. There is  ankylosis of the posterior elements of C2-3 bilaterally.    Soft tissues and spinal canal: No prevertebral fluid or swelling. No  visible canal hematoma.    Disc levels: There is diffuse intervertebral disc space narrowing  throughout the cervical spine in keeping with changes of diffuse  advanced degenerative disc disease. Prevertebral soft tissues are  not thickened on sagittal reformats. No high-grade canal stenosis.  Uncovertebral and facet arthrosis results in mild left  neuroforaminal narrowing at C3-4 and C4-5 and right neuroforaminal  narrowing at C5-6. No high-grade neuroforaminal narrowing.    Upper chest: Negative.    Other: None    IMPRESSION:  1. No acute intracranial abnormality. No calvarial fracture.  2. No acute fracture or listhesis of the cervical spine.  3. Diffuse advanced degenerative disc disease and degenerative joint  disease resulting in multilevel neuroforaminal narrowing as  described above.      Electronically Signed    By: Helyn Numbers M.D.    On: 11/12/2022 20:37   Narrative  CLINICAL DATA:  Trauma due to a fall. Patient bent over and stood  backup resulting in back spasms and fall backwards. Patient hit her  head and lower back.    EXAM:  LUMBAR SPINE - COMPLETE 4+ VIEW    COMPARISON:  Lumbar spine radiographs 04/11/2020    FINDINGS:  Five lumbar type vertebral bodies. Diffuse bone demineralization.  Normal alignment. No vertebral compression deformities. No focal  bone lesion or bone destruction. Degenerative changes throughout  with narrowed interspaces and endplate osteophyte formation.  Degenerative changes in the facet  joints. No significant change  since previous study. Incidental note of vascular coils in the left  upper quadrant, surgical clips in the right upper quadrant.    IMPRESSION:  Diffuse bone demineralization and degenerative changes. Normal  alignment. No acute  displaced fractures identified.      Electronically Signed    By: Burman Nieves M.D.    On: 11/12/2022 20:35   You were seen in the emergency department after your fall.  Your x-ray and CT showed no broken bones and no bleeding in your brain.  You likely strained your back and can continue to take Tylenol every 6 hours as needed for pain as well as use lidocaine patches.  I would use ice for the next 3 days and heat thereafter.  I have given you Flexeril to take as needed for breakthrough pain for back spasms.  This can make you drowsy so do not take it before driving, working or operating heavy machinery.  You should follow-up with your back doctor tomorrow as planned.  You should return to the emergency department if you develop numbness or weakness in your legs, unable to walk, unable to urinate or if you have any other new or concerning symptoms.

## 2022-11-12 NOTE — ED Provider Notes (Signed)
[facility] Provider Note   CSN: [id number] Arrival date & time: 11/12/22  1751     History  Chief Complaint  Patient presents with   Tasha Hunt is a 77 y.o. female.  Patient is a 77 year old female with a past medical history of chronic back pain, hypertension and ulcerative colitis presenting to the emergency department after a fall.  The patient states that she was bent over feeding her cats in the kitchen this afternoon when she developed a spasm in her low back.  She states this caused her to lose her balance and fall backwards and landed on her back denies any loss of consciousness.  She denies any associated lightheadedness or dizziness.  She states that she has had pain in her back since the fall.  She denies any numbness or weakness in her arms or legs loss of bowel or bladder function.  She states she has been able to ambulate normally.  States she is not having a headache.  Denies any blood thinner use.  Of note she states that she is scheduled to have an injection in her back tomorrow.  The history is provided by the patient.  Fall       Home Medications Prior to Admission medications   Medication Sig Start Date End Date Taking? Authorizing Provider  cyclobenzaprine (FLEXERIL) 10 MG tablet Take 0.5 tablets (5 mg total) by mouth 2 (two) times daily as needed for muscle spasms. 11/12/22  Yes Elayne Snare K, DO  acetaminophen (TYLENOL) 325 MG tablet Take 650 mg by mouth every 6 (six) hours as needed for moderate pain or headache.    [provider]  albuterol (VENTOLIN HFA) 108 (90 Base) MCG/ACT inhaler Inhale 1 puff into the lungs every 6 (six) hours as needed for wheezing or shortness of breath.    [provider]  ALPRAZolam Prudy Feeler) 0.25 MG tablet Take 0.25 mg by mouth 2 (two) times daily as needed for anxiety.     [provider]  atorvastatin (LIPITOR) 20 MG tablet Take  20 mg by mouth daily.    [provider]  Camphor-Menthol-Methyl Sal (SALONPAS) 3.01-25-08 % PTCH Apply 1 patch topically daily.    [provider]  cetirizine (ZYRTEC) 10 MG tablet Take 10 mg by mouth daily.    [provider]  Cholecalciferol (DIALYVITE VITAMIN D 5000) 125 MCG (5000 UT) capsule Take 5,000 Units by mouth daily.    [provider]  DILT-XR 180 MG 24 hr capsule Take 180 mg by mouth daily.  01/21/15   [provider]  DULoxetine (CYMBALTA) 60 MG capsule Take 60 mg by mouth daily.    [provider]  fluticasone (FLONASE) 50 MCG/ACT nasal spray Place 2 sprays into the nose daily.    [provider]  Fluticasone Furoate (ARNUITY ELLIPTA) 100 MCG/ACT AEPB Inhale 1 Dose into the lungs daily. 02/12/15   Kozlow, Alvira Philips, MD  folic acid (FOLVITE) 1 MG tablet Take 1 mg by mouth daily.    [provider]  hydrochlorothiazide (HYDRODIURIL) 12.5 MG tablet Take 12.5 mg by mouth daily.  02/09/15   [provider]  HYDROcodone-acetaminophen (NORCO/VICODIN) 5-325 MG tablet Take 1-2 tablets by mouth every 6 (six) hours as needed for moderate pain. 07/31/22   Val Eagle D, NP  ketoconazole (NIZORAL) 2 % cream Apply 1 application topically daily as needed (fungus).    [provider]  Misc Natural  Products (OSTEO BI-FLEX ADV TRIPLE ST PO) Take 2 tablets by mouth daily.    [provider]  Polyethyl Glycol-Propyl Glycol (SYSTANE) 0.4-0.3 % SOLN Place 1 drop into both eyes 4 (four) times daily as needed (for dry eyes).    [provider]  promethazine (PHENERGAN) 12.5 MG tablet Take 1 tablet (12.5 mg total) by mouth every 6 (six) hours as needed for nausea or vomiting. 07/31/22   Val Eagle D, NP  sulfaSALAzine (AZULFIDINE) 500 MG tablet Take 1,000 mg by mouth 2 (two) times daily. 09/24/21   [provider]  traMADol (ULTRAM) 50 MG tablet Take 50 mg by mouth every 6 (six) hours as needed for  moderate pain. Use as needed for moderate pain.    [provider]      Allergies    Morphine and codeine    Review of Systems   Review of Systems  Physical Exam Updated Vital Signs BP (!) 142/81 (BP Location: Left Arm)   Pulse 94   Temp 98.2 F (36.8 C) (Oral)   Resp 16   Ht 5\' 1"  (1.549 m)   Wt 81.6 kg   SpO2 93%   BMI 34.01 kg/m  Physical Exam Vitals and nursing note reviewed.  Constitutional:      General: She is not in acute distress.    Appearance: Normal appearance.  HENT:     Head: Normocephalic and atraumatic.     Nose: Nose normal.     Mouth/Throat:     Mouth: Mucous membranes are moist.     Pharynx: Oropharynx is clear.  Eyes:     Extraocular Movements: Extraocular movements intact.     Conjunctiva/sclera: Conjunctivae normal.     Pupils: Pupils are equal, round, and reactive to light.  Neck:     Comments: No midline neck tenderness Cardiovascular:     Rate and Rhythm: Normal rate and regular rhythm.     Heart sounds: Normal heart sounds.  Pulmonary:     Effort: Pulmonary effort is normal.     Breath sounds: Normal breath sounds.  Abdominal:     General: Abdomen is flat.     Palpations: Abdomen is soft.     Tenderness: There is no abdominal tenderness.  Musculoskeletal:        General: Normal range of motion.     Cervical back: Normal range of motion and neck supple.     Comments: No midline back tenderness Bilateral lumbar paraspinal muscle tenderness to palpation No bony tenderness to bilateral upper or lower extremities  Skin:    General: Skin is warm and dry.     Findings: No bruising.  Neurological:     General: No focal deficit present.     Mental Status: She is alert and oriented to person, place, and time.     Sensory: No sensory deficit.     Motor: No weakness.  Psychiatric:        Mood and Affect: Mood normal.        Behavior: Behavior normal.     ED Results / Procedures / Treatments   Labs (all labs ordered are  listed, but only abnormal results are displayed) Labs Reviewed - No data to display  EKG None  Radiology CT Head Wo Contrast  Result Date: 11/12/2022 CLINICAL DATA:  Head trauma, minor (Age >= 65y); Neck trauma (Age >= 65y) EXAM: CT HEAD WITHOUT CONTRAST CT CERVICAL SPINE WITHOUT CONTRAST TECHNIQUE: Multidetector CT imaging of the head and cervical spine  was performed following the standard protocol without intravenous contrast. Multiplanar CT image reconstructions of the cervical spine were also generated. RADIATION DOSE REDUCTION: This exam was performed according to the departmental dose-optimization program which includes automated exposure control, adjustment of the mA and/or kV according to patient size and/or use of iterative reconstruction technique. COMPARISON:  09/24/2022 FINDINGS: CT HEAD FINDINGS Brain: Normal anatomic configuration. Parenchymal volume loss is commensurate with the patient's age. Stable mild periventricular white matter changes are present likely reflecting the sequela of small vessel ischemia. 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 No abnormal intra or extra-axial mass lesion or fluid collection. No abnormal mass effect or midline shift. No evidence of acute intracranial hemorrhage or infarct. Ventricular size is normal. Cerebellum unremarkable. Vascular: No asymmetric hyperdense vasculature at the skull base. Skull: Intact Sinuses/Orbits: Left maxillary antrostomy and partial ethmoidectomy has been performed. Moderate mucosal thickening within the left maxillary sinus. Mucous retention cyst noted within the left sphenoid sinus. No air-fluid levels. Remaining paranasal sinuses are clear. Orbits are unremarkable. Other: Mastoid air cells and middle ear cavities are clear. CT CERVICAL SPINE FINDINGS Alignment: Normal. Skull base and vertebrae: Craniocervical alignment is normal. The atlantodental interval is not widened. No acute  fracture of the cervical spine. Vertebral body height has been preserved. There is ankylosis of the posterior elements of C2-3 bilaterally. Soft tissues and spinal canal: No prevertebral fluid or swelling. No visible canal hematoma. Disc levels: There is diffuse intervertebral disc space narrowing throughout the cervical spine in keeping with changes of diffuse advanced degenerative disc disease. Prevertebral soft tissues are not thickened on sagittal reformats. No high-grade canal stenosis. Uncovertebral and facet arthrosis results in mild left neuroforaminal narrowing at C3-4 and C4-5 and right neuroforaminal narrowing at C5-6. No high-grade neuroforaminal narrowing. Upper chest: Negative. Other: None IMPRESSION: 1. No acute intracranial abnormality. No calvarial fracture. 2. No acute fracture or listhesis of the cervical spine. 3. Diffuse advanced degenerative disc disease and degenerative joint disease resulting in multilevel neuroforaminal narrowing as described above. Electronically Signed   By: Helyn Numbers M.D.   On: 11/12/2022 20:37   CT Cervical Spine Wo Contrast  Result Date: 11/12/2022 CLINICAL DATA:  Head trauma, minor (Age >= 65y); Neck trauma (Age >= 65y) EXAM: CT HEAD WITHOUT CONTRAST CT CERVICAL SPINE WITHOUT CONTRAST TECHNIQUE: Multidetector CT imaging of the head and cervical spine was performed following the standard protocol without intravenous contrast. Multiplanar CT image reconstructions of the cervical spine were also generated. RADIATION DOSE REDUCTION: This exam was performed according to the departmental dose-optimization program which includes automated exposure control, adjustment of the mA and/or kV according to patient size and/or use of iterative reconstruction technique. COMPARISON:  09/24/2022 FINDINGS: CT HEAD FINDINGS Brain: Normal anatomic configuration. Parenchymal volume loss is commensurate with the patient's age. Stable mild periventricular white matter changes are  present likely reflecting the sequela of small vessel ischemia. 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 No abnormal intra or extra-axial mass lesion or fluid collection. No abnormal mass effect or midline shift. No evidence of acute intracranial hemorrhage or infarct. Ventricular size is normal. Cerebellum unremarkable. Vascular: No asymmetric hyperdense vasculature at the skull base. Skull: Intact Sinuses/Orbits: Left maxillary antrostomy and partial ethmoidectomy has been performed. Moderate mucosal thickening within the left maxillary sinus. Mucous retention cyst noted within the left sphenoid sinus. No air-fluid levels. Remaining paranasal sinuses are clear. Orbits are unremarkable. Other: Mastoid air cells and middle ear cavities  are clear. CT CERVICAL SPINE FINDINGS Alignment: Normal. Skull base and vertebrae: Craniocervical alignment is normal. The atlantodental interval is not widened. No acute fracture of the cervical spine. Vertebral body height has been preserved. There is ankylosis of the posterior elements of C2-3 bilaterally. Soft tissues and spinal canal: No prevertebral fluid or swelling. No visible canal hematoma. Disc levels: There is diffuse intervertebral disc space narrowing throughout the cervical spine in keeping with changes of diffuse advanced degenerative disc disease. Prevertebral soft tissues are not thickened on sagittal reformats. No high-grade canal stenosis. Uncovertebral and facet arthrosis results in mild left neuroforaminal narrowing at C3-4 and C4-5 and right neuroforaminal narrowing at C5-6. No high-grade neuroforaminal narrowing. Upper chest: Negative. Other: None IMPRESSION: 1. No acute intracranial abnormality. No calvarial fracture. 2. No acute fracture or listhesis of the cervical spine. 3. Diffuse advanced degenerative disc disease and degenerative joint disease resulting in multilevel neuroforaminal narrowing as described above.  Electronically Signed   By: Helyn Numbers M.D.   On: 11/12/2022 20:37   DG Lumbar Spine Complete  Result Date: 11/12/2022 CLINICAL DATA:  Trauma due to a fall. Patient bent over and stood backup resulting in back spasms and fall backwards. Patient hit her head and lower back. EXAM: LUMBAR SPINE - COMPLETE 4+ VIEW COMPARISON:  Lumbar spine radiographs 04/11/2020 FINDINGS: Five lumbar type vertebral bodies. Diffuse bone demineralization. Normal alignment. No vertebral compression deformities. No focal bone lesion or bone destruction. Degenerative changes throughout with narrowed interspaces and endplate osteophyte formation. Degenerative changes in the facet joints. No significant change since previous study. Incidental note of vascular coils in the left upper quadrant, surgical clips in the right upper quadrant. IMPRESSION: Diffuse bone demineralization and degenerative changes. Normal alignment. No acute displaced fractures identified. Electronically Signed   By: Burman Nieves M.D.   On: 11/12/2022 20:35    Procedures Procedures    Medications Ordered in ED Medications  lidocaine (LIDODERM) 5 % 1-3 patch (1 patch Transdermal Patch Applied 11/12/22 2119)  acetaminophen (TYLENOL) tablet 1,000 mg (1,000 mg Oral Given 11/12/22 2117)  cyclobenzaprine (FLEXERIL) tablet 10 mg (10 mg Oral Given 11/12/22 2117)    ED Course/ Medical Decision Making/ A&P                                 Medical Decision Making This patient presents to the ED with chief complaint(s) of back pain, fall with pertinent past medical history of chronic back pain, HTN, UC which further complicates the presenting complaint. The complaint involves an extensive differential diagnosis and also carries with it a high risk of complications and morbidity.    The differential diagnosis includes due to patient's age and trauma concern for ICH, mass effect, cervical spine fracture, lumbar fracture, muscle strain or spasm, no other  medic injury seen on exam, no presyncopal symptoms making syncopal fall unlikely, no neurologic deficits, loss of bowel or bladder function making cauda equina unlikely  Additional history obtained: Additional history obtained from N/A Records reviewed Primary Care Documents  ED Course and Reassessment: On patient's arrival she was hemodynamically stable in no acute distress.  She was initially evaluated by triage and had CT head, C-spine and lumbar x-ray performed.  Patient's imaging showed no acute traumatic injury.  Patient's exam was consistent with muscle strain or spasm in her back, she has no neurologic deficits, had no presyncopal symptoms.  She did drive herself here.  She will  be given Tylenol and lidocaine patch and was given Flexeril to take when she gets home.  She was recommended to follow-up with her back doctor tomorrow as scheduled and was given strict return precautions.  Independent labs interpretation:  N/A  Independent visualization of imaging: - I independently visualized the following imaging with scope of interpretation limited to determining acute life threatening conditions related to emergency care: CTH/C-spine, Lumbar XR, which revealed no acute traumatic injury  Consultation: - Consulted or discussed management/test interpretation w/ external professional: N/A  Consideration for admission or further workup: Patient has no emergent conditions requiring admission or further work-up at this time and is stable for discharge home with primary care follow-up  Social Determinants of health: N/A    Amount and/or Complexity of Data Reviewed Radiology: ordered.  Risk OTC drugs. Prescription drug management.          Final Clinical Impression(s) / ED Diagnoses Final diagnoses:  Fall, initial encounter  Strain of lumbar region, initial encounter    Rx / DC Orders ED Discharge Orders          Ordered    cyclobenzaprine (FLEXERIL) 10 MG tablet  2 times  daily PRN        11/12/22 2108              Rexford Maus, DO 11/12/22 2126

## 2023-02-08 ENCOUNTER — Encounter (INDEPENDENT_AMBULATORY_CARE_PROVIDER_SITE_OTHER): Payer: PPO | Admitting: Ophthalmology

## 2023-02-22 ENCOUNTER — Encounter (INDEPENDENT_AMBULATORY_CARE_PROVIDER_SITE_OTHER): Payer: Medicare Other | Admitting: Ophthalmology

## 2023-02-22 DIAGNOSIS — D3131 Benign neoplasm of right choroid: Secondary | ICD-10-CM | POA: Diagnosis not present

## 2023-02-22 DIAGNOSIS — H35033 Hypertensive retinopathy, bilateral: Secondary | ICD-10-CM

## 2023-02-22 DIAGNOSIS — H43813 Vitreous degeneration, bilateral: Secondary | ICD-10-CM

## 2023-02-22 DIAGNOSIS — I10 Essential (primary) hypertension: Secondary | ICD-10-CM | POA: Diagnosis not present

## 2023-02-25 ENCOUNTER — Other Ambulatory Visit: Payer: Self-pay | Admitting: Surgery

## 2023-02-25 DIAGNOSIS — Z1239 Encounter for other screening for malignant neoplasm of breast: Secondary | ICD-10-CM

## 2023-04-01 ENCOUNTER — Ambulatory Visit
Admission: RE | Admit: 2023-04-01 | Discharge: 2023-04-01 | Disposition: A | Discharge status: Home / Self Care | Payer: Medicare Other | Source: Ambulatory Visit | Attending: Surgery | Admitting: Surgery

## 2023-04-01 DIAGNOSIS — Z1239 Encounter for other screening for malignant neoplasm of breast: Secondary | ICD-10-CM

## 2023-04-01 MED ORDER — GADOPICLENOL 0.5 MMOL/ML IV SOLN
7.0000 mL | Freq: Once | INTRAVENOUS | Status: AC | PRN
  Administered 2023-04-01: 7 mL via INTRAVENOUS

## 2023-04-06 ENCOUNTER — Other Ambulatory Visit: Payer: Self-pay | Admitting: Internal Medicine

## 2023-04-06 DIAGNOSIS — Z1231 Encounter for screening mammogram for malignant neoplasm of breast: Secondary | ICD-10-CM

## 2023-06-30 ENCOUNTER — Encounter (HOSPITAL_BASED_OUTPATIENT_CLINIC_OR_DEPARTMENT_OTHER): Payer: Self-pay

## 2023-06-30 ENCOUNTER — Other Ambulatory Visit: Payer: Self-pay

## 2023-06-30 ENCOUNTER — Emergency Department (HOSPITAL_BASED_OUTPATIENT_CLINIC_OR_DEPARTMENT_OTHER)
Admission: EM | Admit: 2023-06-30 | Discharge: 2023-06-30 | Disposition: A | Discharge status: Home / Self Care | Attending: Emergency Medicine | Admitting: Emergency Medicine

## 2023-06-30 ENCOUNTER — Emergency Department (HOSPITAL_BASED_OUTPATIENT_CLINIC_OR_DEPARTMENT_OTHER)

## 2023-06-30 DIAGNOSIS — S0083XA Contusion of other part of head, initial encounter: Secondary | ICD-10-CM | POA: Insufficient documentation

## 2023-06-30 DIAGNOSIS — W0110XA Fall on same level from slipping, tripping and stumbling with subsequent striking against unspecified object, initial encounter: Secondary | ICD-10-CM | POA: Insufficient documentation

## 2023-06-30 DIAGNOSIS — I10 Essential (primary) hypertension: Secondary | ICD-10-CM | POA: Insufficient documentation

## 2023-06-30 DIAGNOSIS — Z79899 Other long term (current) drug therapy: Secondary | ICD-10-CM | POA: Diagnosis not present

## 2023-06-30 DIAGNOSIS — S8001XA Contusion of right knee, initial encounter: Secondary | ICD-10-CM | POA: Diagnosis not present

## 2023-06-30 DIAGNOSIS — J449 Chronic obstructive pulmonary disease, unspecified: Secondary | ICD-10-CM | POA: Insufficient documentation

## 2023-06-30 DIAGNOSIS — S0081XA Abrasion of other part of head, initial encounter: Secondary | ICD-10-CM

## 2023-06-30 DIAGNOSIS — W19XXXA Unspecified fall, initial encounter: Secondary | ICD-10-CM

## 2023-06-30 MED ORDER — ACETAMINOPHEN 500 MG PO TABS
1000.0000 mg | ORAL_TABLET | Freq: Once | ORAL | Status: AC
  Administered 2023-06-30: 1000 mg via ORAL
  Filled 2023-06-30: qty 2

## 2023-06-30 MED ORDER — BACITRACIN ZINC 500 UNIT/GM EX OINT
TOPICAL_OINTMENT | Freq: Once | CUTANEOUS | Status: AC
  Administered 2023-06-30: 31.5 via TOPICAL
  Filled 2023-06-30: qty 28.35

## 2023-06-30 NOTE — ED Triage Notes (Signed)
 Pt reports mechanical fall earlier this am. Reports missing her step and hitting her head. Abrasion noted to right forehead.     Denies any other injuries No thinners, NO LOC  Bleed last year from fall

## 2023-06-30 NOTE — Discharge Instructions (Addendum)
 Your head CT did not show any sign of a brain bleed.  There is a small collection of blood underneath the forehead from where you hit her head.  This will reabsorb with time.  You may apply ice to help with pain and swelling.  Please keep the abrasion to your forehead clean with soap and water .  You may apply an antibiotic ointment such as bacitracin or Neosporin to the area to help with healing.  You may take up to 1000mg  of tylenol  every 6 hours as needed for pain.  Do not take more then 4g per day.  Please return to the emergency room if you develop any headaches, vision changes, vomiting, any other new or concerning symptoms

## 2023-06-30 NOTE — ED Provider Notes (Signed)
 [facility] Provider Note   CSN: [id number] Arrival date & time: 06/30/23  1143     History  Chief Complaint  Patient presents with   Tasha Hunt is a 78 y.o. female with history of subdural hematoma, hypertension, COPD, presents with concern for fall that occurred earlier today.  States she was stepping onto the curb when her right knee gave out on her causing her to fall.  She fell onto her right knee and did hit her head.  She denies any loss of consciousness, vision changes, nausea or vomiting.  Denies any weakness, dizziness, chest pain, shortness of breath before or after this fall.  She is not on any anticoagulants.  Currently feels at baseline besides pain from where she hit her head.   Fall Associated symptoms include headaches.       Home Medications Prior to Admission medications   Medication Sig Start Date End Date Taking? Authorizing Provider  acetaminophen  (TYLENOL ) 325 MG tablet Take 650 mg by mouth every 6 (six) hours as needed for moderate pain or headache.    [provider]  albuterol  (VENTOLIN  HFA) 108 (90 Base) MCG/ACT inhaler Inhale 1 puff into the lungs every 6 (six) hours as needed for wheezing or shortness of breath.    [provider]  ALPRAZolam  (XANAX ) 0.25 MG tablet Take 0.25 mg by mouth 2 (two) times daily as needed for anxiety.     [provider]  atorvastatin  (LIPITOR) 20 MG tablet Take 20 mg by mouth daily.    [provider]  Camphor-Menthol -Methyl Sal (SALONPAS) 3.01-25-08 % PTCH Apply 1 patch topically daily.    [provider]  cetirizine (ZYRTEC) 10 MG tablet Take 10 mg by mouth daily.    [provider]  Cholecalciferol  (DIALYVITE VITAMIN D  5000) 125 MCG (5000 UT) capsule Take 5,000 Units by mouth daily.    [provider]  cyclobenzaprine  (FLEXERIL ) 10 MG tablet Take 0.5 tablets (5 mg total) by mouth 2 (two) times  daily as needed for muscle spasms. 11/12/22   Kingsley, Victoria K, DO  DILT-XR 180 MG 24 hr capsule Take 180 mg by mouth daily.  01/21/15   [provider]  DULoxetine  (CYMBALTA ) 60 MG capsule Take 60 mg by mouth daily.    [provider]  fluticasone  (FLONASE ) 50 MCG/ACT nasal spray Place 2 sprays into the nose daily.    [provider]  Fluticasone  Furoate (ARNUITY ELLIPTA ) 100 MCG/ACT AEPB Inhale 1 Dose into the lungs daily. 02/12/15   Kozlow, Rema Care, MD  folic acid  (FOLVITE ) 1 MG tablet Take 1 mg by mouth daily.    [provider]  hydrochlorothiazide  (HYDRODIURIL ) 12.5 MG tablet Take 12.5 mg by mouth daily.  02/09/15   [provider]  HYDROcodone -acetaminophen  (NORCO/VICODIN) 5-325 MG tablet Take 1-2 tablets by mouth every 6 (six) hours as needed for moderate pain. 07/31/22   Bergman, Meghan D, NP  ketoconazole  (NIZORAL ) 2 % cream Apply 1 application topically daily as needed (fungus).    [provider]  Misc Natural Products (OSTEO BI-FLEX ADV TRIPLE ST PO) Take 2 tablets by mouth daily.    [provider]  Polyethyl Glycol-Propyl Glycol (SYSTANE) 0.4-0.3 % SOLN Place 1 drop into both eyes 4 (four) times daily as needed (for dry eyes).    [provider]  promethazine  (PHENERGAN ) 12.5 MG tablet Take 1 tablet (12.5 mg total) by mouth every 6 (six)  hours as needed for nausea or vomiting. 07/31/22   Bergman, Meghan D, NP  sulfaSALAzine  (AZULFIDINE ) 500 MG tablet Take 1,000 mg by mouth 2 (two) times daily. 09/24/21   [provider]  traMADol (ULTRAM) 50 MG tablet Take 50 mg by mouth every 6 (six) hours as needed for moderate pain. Use as needed for moderate pain.    [provider]      Allergies    Morphine  and codeine    Review of Systems   Review of Systems  Neurological:  Positive for headaches.    Physical Exam Updated Vital Signs BP 136/73   Pulse 87   Temp 97.6 F (36.4 C) (Oral)   Resp 17    SpO2 94%  Physical Exam Vitals and nursing note reviewed.  Constitutional:      General: She is not in acute distress.    Appearance: She is well-developed.  HENT:     Head: Normocephalic.     Comments: Hematoma to upper right forehead with overlying abrasion.  No laceration.  No foreign body.  Bleeding well-controlled  No battle sign, racoon eyes, or other areas of ecchymosis   Skull nontender to palpation diffusely Eyes:     Extraocular Movements: Extraocular movements intact.     Conjunctiva/sclera: Conjunctivae normal.     Pupils: Pupils are equal, round, and reactive to light.  Cardiovascular:     Rate and Rhythm: Normal rate and regular rhythm.     Heart sounds: No murmur heard. Pulmonary:     Effort: Pulmonary effort is normal. No respiratory distress.     Breath sounds: Normal breath sounds.  Abdominal:     Palpations: Abdomen is soft.     Tenderness: There is no abdominal tenderness.  Musculoskeletal:        General: No swelling.     Cervical back: Neck supple.     Comments: Full range of motion of the upper and lower extremities bilaterally.  Able to ambulate without difficulty  Skin:    General: Skin is warm and dry.     Capillary Refill: Capillary refill takes less than 2 seconds.     Comments: Ecchymoses over the right knee  Neurological:     General: No focal deficit present.     Mental Status: She is alert.  Psychiatric:        Mood and Affect: Mood normal.     ED Results / Procedures / Treatments   Labs (all labs ordered are listed, but only abnormal results are displayed) Labs Reviewed - No data to display  EKG None  Radiology CT Head Wo Contrast Result Date: 06/30/2023 CLINICAL DATA:  Head trauma, minor (Age >= 65y) EXAM: CT HEAD WITHOUT CONTRAST TECHNIQUE: Contiguous axial images were obtained from the base of the skull through the vertex without intravenous contrast. RADIATION DOSE REDUCTION: This exam was performed according to the departmental  dose-optimization program which includes automated exposure control, adjustment of the mA and/or kV according to patient size and/or use of iterative reconstruction technique. COMPARISON:  CT of the head dated November 12, 2022. FINDINGS: Brain: There is moderate generalized cerebral volume loss and mild to moderate periventricular white matter disease. There is no evidence of acute intracranial injury. Vascular: Mild calcific atheromatous disease within the carotid siphons. Skull: Intact and unremarkable. There is a small subgaleal hematoma overlying the right frontal bone. Sinuses/Orbits: Status post bilateral lens replacement surgery. The patient is status post left antrostomy. There is circumferential mucosal disease with left  maxillary sinus. There is polypoid mucosal disease within the left sphenoid sinus. Other: None. IMPRESSION: 1. Small right frontal subgaleal hematoma. 2. Age related cerebral volume loss and mild to moderate cerebral white matter disease. Electronically Signed   By: Maribeth Shivers M.D.   On: 06/30/2023 13:12   CT Cervical Spine Wo Contrast Result Date: 06/30/2023 CLINICAL DATA:  78 year old female status post fall this morning. History of previous posttraumatic subdural. EXAM: CT CERVICAL SPINE WITHOUT CONTRAST TECHNIQUE: Multidetector CT imaging of the cervical spine was performed without intravenous contrast. Multiplanar CT image reconstructions were also generated. RADIATION DOSE REDUCTION: This exam was performed according to the departmental dose-optimization program which includes automated exposure control, adjustment of the mA and/or kV according to patient size and/or use of iterative reconstruction technique. COMPARISON:  Head CT today reported separately. Cervical spine CT 11/12/2022. FINDINGS: Alignment: Maintained cervical lordosis. Cervicothoracic junction alignment is within normal limits. Bilateral posterior element alignment is within normal limits. Skull base and  vertebrae: Bone mineralization stable from last year. Visualized skull base is intact. No atlanto-occipital dissociation. C1 and C2 appear intact and aligned, congenital incomplete ossification of the posterior C1 ring (normal variant). No acute osseous abnormality identified. Soft tissues and spinal canal: No prevertebral fluid or swelling. No visible canal hematoma. Negative visible noncontrast neck soft tissues, mild for age calcified carotid atherosclerosis. Disc levels: Chronic cervical spine degeneration, but mostly age-appropriate. There is advanced chronic left C1-C2 joint space loss. Upper chest: Grossly intact visible upper thoracic levels. Mild respiratory motion, negative lung apices. Calcified aortic atherosclerosis. IMPRESSION: 1. No acute traumatic injury identified in the cervical spine. 2.  Aortic Atherosclerosis (ICD10-I70.0). Electronically Signed   By: Marlise Simpers M.D.   On: 06/30/2023 13:08    Procedures Procedures    Medications Ordered in ED Medications  bacitracin  ointment (31.5 Applications Topical Given 06/30/23 1221)  acetaminophen  (TYLENOL ) tablet 1,000 mg (1,000 mg Oral Given 06/30/23 1349)    ED Course/ Medical Decision Making/ A&P                                 Medical Decision Making Amount and/or Complexity of Data Reviewed Radiology: ordered.  Risk OTC drugs.     Differential diagnosis includes but is not limited to abrasion, laceration, hematoma, intracranial hemorrhage, skull fracture, syncope  ED Course:  Upon initial evaluation, patient is well-appearing, stable vitals.  She reports mechanical fall earlier today and has good story for mechanical fall.  Denies any prodromal symptoms including no chest pain, shortness of breath, dizziness.  Low concern for a syncopal episode from ACS, PE, arrhythmia, seizure, etc. No indication for labs or EKG at this time.  She did fall onto her right knee, but denies any pain in this knee.  Right and left lower  extremities diffusely nontender to palpation, full range of motion of the bilateral lower extremities, neurovascular intact in the bilateral lower extremities, unable to walk without difficulty.  No concern for fracture or dislocation at this time. No indication for imaging of the lower extremities. She hit her head and has a hematoma and abrasion to the right side of her forehead.  No cervical, thoracic, or lumbar spinal tenderness palpation.  Will proceed with CT head imaging and CT cervical spine imaging given mechanism of injury.  Imaging Studies ordered: I ordered imaging studies including CT head, CT cervical spine I independently visualized the imaging with scope of interpretation limited to  determining acute life threatening conditions related to emergency care. Imaging showed no acute abnormalities I agree with the radiologist interpretation    Medications Given: Tylenol   Upon re-evaluation, patient still remains well-appearing with stable vitals.  Discussed that her imaging did show the hematoma seen on exam, but no intracranial hemorrhage or skull fractures.  The abrasion was cleaned by nursing and dressed with bacitracin  ointment.  Will discharge home.    Impression: Mechanical fall Forehead hematoma and abrasion  Disposition:  The patient was discharged home with instructions to keep the abrasion clean with soap and water .  May apply antibiotic ointment over top with healing.  Tylenol  as needed for pain.  Follow-up with PCP as needed. Return precautions given.    This chart was dictated using voice recognition software, Dragon. Despite the best efforts of this provider to proofread and correct errors, errors may still occur which can change documentation meaning.          Final Clinical Impression(s) / ED Diagnoses Final diagnoses:  Fall, initial encounter  Forehead abrasion, initial encounter  Traumatic hematoma of forehead, initial encounter    Rx / DC  Orders ED Discharge Orders     None         Rexie Catena, PA-C 06/30/23 1438    Mordecai Applebaum, MD 07/06/23 [id number]

## 2023-07-19 NOTE — Progress Notes (Signed)
 Subjective:    Patient ID:  Tasha Hunt is a 78 y.o. female  Chief Complaint: Chief Complaint  Patient presents with  . Sore Throat    Nasal congestion, sinus drainage, cough x5 days     HPI  History is provided by the patient.  Patient presents with complaints of sore throat, drainage, and nasal congestion x 5 days.  Denies known exposure to COVID.  Patient has been taking Mucinex  for symptoms.  Reports history of sinus infections requiring antibiotics.  Denies fever, body aches, chills, nausea, vomiting, difficulty swallowing or drooling, chest pain, cough, shortness of breath, back pain, abdominal pain.   Past Medical History:  Diagnosis Date  . Allergy years   Zyrtec  . Anxiety 1995   Death of brother-AIDS & still causes anxiety  . Asthma (*) years   albuterol  as needed  . Cataracts, bilateral 2011   Removed from both eyes  . Clotting disorder (*) 2024   Fell-subdural hematoma but no surgery  . Colon polyp unknown   Adenoid  . COPD (chronic obstructive pulmonary disease) (*) 10 years   never smoked  . GERD (gastroesophageal reflux disease) years  . Hypertension years   Controled  . Inflammatory bowel disease 2021   UC  . Osteoarthritis years   Both knees & rt. shoulder replaced     Patient Active Problem List  Diagnosis  . Chronic obstructive pulmonary disease (*)  . Abnormal electrocardiogram (ECG) (EKG)  . Aneurysm of splenic artery  . Asthma with COPD (*)  . Atherosclerosis of aorta  . Atypical ductal hyperplasia of breast  . Diverticulosis of colon  . Gastro-esophageal reflux disease without esophagitis  . Essential hypertension  . Generalized anxiety disorder  . History of fall  . History of subdural hematoma  . Idiopathic scoliosis of lumbar spine  . Lumbar radiculopathy  . Obesity due to excess calories with serious comorbidity  . S/P total knee replacement  . S/P shoulder replacement, right  . Pure hypercholesterolemia  . Perennial  allergic rhinitis with seasonal variation  . Primary osteoarthritis involving multiple joints  . Recurrent major depression in remission  . Spinal stenosis of lumbosacral region  . Ulcerative colitis (*)  . Ulcerative proctitis without complication (*)  . Grief  . Cirrhosis of liver without ascites (*)  . BMI 34.0-34.9,adult  . Varicose veins of both lower extremities with pain    Current Outpatient Medications on File Prior to Visit  Medication Sig Dispense Refill  . acetaminophen  (TYLENOL ) 325 mg tablet Take two tablets (650 mg dose) by mouth every 6 (six) hours as needed for Pain.    . albuterol  sulfate HFA (PROVENTIL ,VENTOLIN ,PROAIR ) 108 (90 Base) MCG/ACT inhaler Inhale two puffs into the lungs every 4 (four) hours as needed for Wheezing or Shortness of Breath.    . atorvastatin  (LIPITOR) 20 mg tablet Take one tablet (20 mg dose) by mouth daily.    SABRA buPROPion HCl (WELLBUTRIN XL) 150 mg 24 hr tablet Take one tablet (150 mg dose) by mouth every morning. 30 tablet 1  . busPIRone (BUSPAR) 10 mg tablet Take one tablet (10 mg dose) by mouth 2 (two) times daily. 60 tablet 4  . cetirizine (ZYRTEC) 10 mg tablet Take one tablet (10 mg dose) by mouth daily.    . Cholecalciferol  50 MCG (2000 UT) CAPS Take one capsule (2,000 Units dose) by mouth daily.    SABRA DILT-XR 180 MG 24 hr capsule Take one capsule (180 mg dose) by mouth  daily.    . DULoxetine  HCl (CYMBALTA ) 60 mg capsule Take 1 tablet by mouth daily.    . fluticasone  propionate (FLONASE ) 50 mcg/actuation nasal spray two sprays by Both Nostrils route daily. 16 g 3  . fluticasone -vilanterol (BREO ELLIPTA) 100-25 mcg oral inhaler Inhale one puff into the lungs daily. 1 each 3  . folic acid  1 mg tablet TAKE 1 TABLET (1 MG TOTAL) BY MOUTH DAILY. TAKE DAILY WHILE ON SULFASALAZINE     . GEMTESA 75 MG TABS tablet Take one tablet (75 mg dose) by mouth daily.    SABRA glucosamine-chondroitin 500-400 mg per tablet Take one tablet by mouth daily.    .  hydrochlorothiazide  (HYDRODIURIL ) 12.5 mg tablet Take one tablet (12.5 mg dose) by mouth daily.    . minoxidil (LONITEN) 2.5 mg tablet Take one tablet (2.5 mg dose) by mouth daily.    SABRA nystatin (MYCOSTATIN) powder Apply one Application topically as needed.    . predniSONE 5 MG TBEC Take by mouth.    . sulfaSALAzine  (AZULFIDINE ) 500 MG tablet TAKE 2 TABLETS (1,000 MG TOTAL) BY MOUTH 2 TIMES DAILY. TAKE W/ DAILY FOLIC ACID      No current facility-administered medications on file prior to visit.     Past medical, surgical and family history reviewed.    Social History   Tobacco Use: Low Risk  (07/05/2023)   Patient History   . Smoking Tobacco Use: Never   . Smokeless Tobacco Use: Never   . Passive Exposure: Not on file    Review of Systems  Constitutional:  Negative for chills, fever and malaise/fatigue.  HENT:  Positive for congestion and sore throat. Negative for ear pain and sinus pain.   Eyes:  Negative for double vision, pain, discharge and redness.  Respiratory:  Negative for cough, sputum production, shortness of breath and wheezing.   Cardiovascular:  Negative for chest pain.  Gastrointestinal:  Negative for abdominal pain, diarrhea, nausea and vomiting.  Musculoskeletal:  Negative for back pain and myalgias.  Skin:  Negative for rash.  Neurological:  Negative for dizziness, weakness and headaches.     Objective:   Vitals:   07/19/23 1602  BP: 147/86  BP Location: Right Upper Arm  Patient Position: Sitting  Pulse: 87  Resp: 18  Temp: 98.4 F (36.9 C)  TempSrc: Oral  SpO2: 95%  Weight: 183 lb (83 kg)  Height: 5' 1 (1.549 m)    Physical Exam Vitals and nursing note reviewed.  Constitutional:      General: She is not in acute distress.    Appearance: Normal appearance. She is not ill-appearing, toxic-appearing or diaphoretic.  HENT:     Head: Normocephalic and atraumatic.     Right Ear: Tympanic membrane, ear canal and external ear normal.     Left Ear:  Tympanic membrane, ear canal and external ear normal.     Nose: Nose normal. No congestion or rhinorrhea.     Mouth/Throat:     Mouth: Mucous membranes are moist.     Pharynx: Oropharynx is clear. No oropharyngeal exudate or posterior oropharyngeal erythema.  Eyes:     General:        Right eye: No discharge.        Left eye: No discharge.     Extraocular Movements: Extraocular movements intact.     Conjunctiva/sclera: Conjunctivae normal.  Cardiovascular:     Rate and Rhythm: Normal rate and regular rhythm.     Pulses: Normal pulses.  Heart sounds: Normal heart sounds.  Pulmonary:     Effort: Pulmonary effort is normal. No respiratory distress.     Breath sounds: Normal breath sounds. No stridor. No wheezing, rhonchi or rales.  Musculoskeletal:     Cervical back: Normal range of motion.  Lymphadenopathy:     Cervical: No cervical adenopathy.  Skin:    General: Skin is warm and dry.     Findings: No lesion or rash.  Neurological:     General: No focal deficit present.     Mental Status: She is alert and oriented to person, place, and time.  Psychiatric:        Mood and Affect: Mood normal.        Behavior: Behavior normal.       Assessment:   Results of tests available at time of visit: Recent Results (from the past 24 hours)  POCT Strep Nucleic Acid   Collection Time: 07/19/23  4:17 PM  Result Value Ref Range   Strep Negative Negative  POCT CoVid-19 nucleic acid   Collection Time: 07/19/23  4:22 PM  Result Value Ref Range   SARS-COV-2, NAA Negative Negative      Plan:   1. Acute bacterial sinusitis   2. Sore throat   3. Nasal congestion     Orders Placed This Encounter  Procedures  . POCT Strep Nucleic Acid  . POCT CoVid-19 nucleic acid    Patient's Medications  New Prescriptions   AMOXICILLIN-CLAVULANATE (AUGMENTIN) 875-125 MG PER TABLET    Take one tablet by mouth 2 (two) times daily for 10 days.  Discontinued Medications   No medications on  file      MDM:  Tasha Hunt is a 78 y.o. female with complaints of congestion, drainage, and sore throat.  Negative strep test.  Negative COVID test.  Sore throat could be due to drainage and irritation/inflammation.  Advised OTC Alka-Seltzer as directed or continued use of Mucinex .  As patient states she always has a sore throat with sinus infections, I sent Rx for Augmentin to patient's pharmacy to take if symptoms persist or worsen.  Advised of red flags and when to go to the ED for immediate evaluation.  Patient verbalized understanding and agreement.  DDx:  Viral pharyngitis, URI, seasonal allergies, early bacterial sinus infection   Complexity No additional elements  Comorbidities No comorbidities noted.    Problems Acute uncomplicated illness/injury (3)  Data No significant data or note reviewed (2)  Risk OTC Meds (3)  Prescription medication or discussion against use (4)   Previsit planning was completed via snapshot and review of chart.  I reviewed the patient instructions on the AVS with the patient/family verbalized to me that they understood what their problem is, what they need to do about it, and why it is important that they do it.  The patient/family voices understanding of all medications. No barriers to adherence were noted. Patient is taking all medications as prescribed and is tolerating well.  Plan for follow-up as discussed or as needed if any worsening symptoms or change in condition.  After visit summary discussed with patient.   The patient indicates understanding of these issues and agrees with the plan. Lissa GORMAN Counter, PA-C    Text above is via Dragon naturally speaking voice recognition transcription. There may be typographical errors.  Patient Instructions  Sinusitis: If nasal congestion and drainage do not improve with OTC medications, take antibiotic as directed as you likely have a sinus infection.  Take Tylenol  for any  discomfort.  Take Alka Seltzer OTC medication as directed to help alleviate nasal congestion and drainage.   Follow up with your PCP if symptoms persist in spite of medication use.  If you develop a fever, worsening cough, chest pain, shortness of breath, a severe headache, a change in vision, or weakness, go to the ED for immediate evaluation. Contact our clinic with any questions or concerns.

## 2023-07-26 ENCOUNTER — Ambulatory Visit: Attending: Physician Assistant

## 2023-07-26 ENCOUNTER — Other Ambulatory Visit: Payer: Self-pay

## 2023-07-26 DIAGNOSIS — R262 Difficulty in walking, not elsewhere classified: Secondary | ICD-10-CM | POA: Insufficient documentation

## 2023-07-26 DIAGNOSIS — R296 Repeated falls: Secondary | ICD-10-CM | POA: Insufficient documentation

## 2023-07-26 NOTE — Therapy (Signed)
 OUTPATIENT PHYSICAL THERAPY LOWER EXTREMITY EVALUATION   Patient Name: Tasha Hunt MRN: [id number] DOB:[birth year], 78 y.o., female Today's Date: 07/26/2023  END OF SESSION:  PT End of Session - 07/26/23 1330     Visit Number 1    Date for PT Re-Evaluation 10/18/23    Progress Note Due on Visit 10    PT Start Time 1330    PT Stop Time 1415    PT Time Calculation (min) 45 min          Past Medical History:  Diagnosis Date   Allergic rhinitis, cause unspecified    Aneurysm of splenic artery (HCC)    noted since 2002   Anxiety    Arthritis    hands   Asthma    Complication of anesthesia    COPD (chronic obstructive pulmonary disease) (HCC)    Depressive disorder, not elsewhere classified    Dyspnea    Esophageal reflux    H/O hiatal hernia    Hypertension    Obesity, unspecified    Pneumonia    03/27/11 many many years ago   PONV (postoperative nausea and vomiting)    Ulcerative colitis (HCC)    Past Surgical History:  Procedure Laterality Date   ABDOMINAL HYSTERECTOMY  1980   ARTERIAL ANEURYSM REPAIR  03/27/2011   splenic artery   BREAST BIOPSY  2003   right   BREAST BIOPSY  2012   left   BREAST LUMPECTOMY WITH RADIOACTIVE SEED LOCALIZATION Right 07/06/2019   Procedure: RIGHT BREAST LUMPECTOMY WITH RADIOACTIVE SEED LOCALIZATION;  Surgeon: Vanderbilt Ned, MD;  Location: [facility];  Service: General;  Laterality: Right;   CATARACT EXTRACTION W/ INTRAOCULAR LENS IMPLANT Bilateral    CHOLECYSTECTOMY  2002   EMBOLIZATION N/A 03/27/2011   Procedure: EMBOLIZATION;  Surgeon: Carlin FORBES Haddock, MD;  Location: [facility] CATH LAB;  Service: Cardiovascular;  Laterality: N/A;   HERNIA REPAIR     KNEE CLOSED REDUCTION Left 10/24/2021   Procedure: CLOSED MANIPULATION KNEE;  Surgeon: Ernie Cough, MD;  Location: [facility];  Service: Orthopedics;  Laterality: Left;   LAPAROSCOPIC NISSEN FUNDOPLICATION  2002   NASAL SEPTUM SURGERY  1985   deviated septum    REVERSE SHOULDER ARTHROPLASTY Right 11/17/2019   Procedure: REVERSE SHOULDER ARTHROPLASTY;  Surgeon: Kay Kemps, MD;  Location: [facility];  Service: Orthopedics;  Laterality: Right;  interscalene block   TONSILLECTOMY  1948   TOTAL KNEE ARTHROPLASTY Right 04/20/2016   Procedure: RIGHT TOTAL KNEE ARTHROPLASTY;  Surgeon: Cough Ernie, MD;  Location: [facility];  Service: Orthopedics;  Laterality: Right;   TOTAL KNEE ARTHROPLASTY Left 06/19/2021   Patient Active Problem List   Diagnosis Date Noted   SDH (subdural hematoma) (HCC) 07/28/2022   S/P shoulder replacement, right 11/17/2019   S/P right TKA 04/20/2016   Aneurysm of splenic artery (HCC) 03/19/2011    PCP: Ainsley. Truman, MD  REFERRING PROVIDER: Jackson Mow, [place], [place]  REFERRING DIAG: h/o falls  THERAPY DIAG:  Difficulty in walking, not elsewhere classified - Plan: PT plan of care cert/re-cert  Repeated falls - Plan: PT plan of care cert/re-cert  Rationale for Evaluation and Treatment: Rehabilitation  ONSET DATE: most recent 06/30/23  SUBJECTIVE:   SUBJECTIVE STATEMENT: I have fallen several times over the last year, have hit my head a few times, most recently I fell backwards trying to open the car door, and landed on back of my head, also I tried to climb a step and my L leg gave  away and I fell on my face. I do feel like I've been swimmy headed  since all of these falls started, about 2 years.  PERTINENT HISTORY: Frequent falls, has undergone knee replacements each knee, also R TSA,. Has arthritis in lower back.  PAIN:  Are you having pain? Yes: NPRS scale: 0 to 8 Pain location: lower back, L shoulder Pain description: very stiff, takes a few hours in am to warm up Aggravating factors: prolonged standing, prolonged positioning Relieving factors: heat, meds  PRECAUTIONS: Fall  RED FLAGS: None   WEIGHT BEARING RESTRICTIONS: No  FALLS:  Has patient fallen in last 6 months? Yes several falls   LIVING  ENVIRONMENT: Lives with: lives alone Lives in: House/apartment Stairs: No Has following equipment at home: Single point cane and Environmental consultant - 2 wheeled  OCCUPATION: retired  PLOF: Independent  PATIENT GOALS: avoid falls  NEXT MD VISIT: mid August   OBJECTIVE:  Note: Objective measures were completed at Evaluation unless otherwise noted.  DIAGNOSTIC FINDINGS: na    COGNITION: Overall cognitive status: Within functional limits for tasks assessed     SENSATION: WFL   POSTURE: mild B hip flexion, wider than normal base of support, rounded thoracic spine   PALPATION: na  LOWER EXTREMITY ROM: grossly wfl  LOWER EXTREMITY MMT: grossly 4/5 with burst testing throughout     FUNCTIONAL TESTS:  5 times sit to stand: 15.45  BERG 34/54  FGA 10/30 GAIT: Distance walked: 44' in clinic Assistive device utilized: Single point cane Level of assistance: Modified independence Comments: very slow cadence, and gait speed, decreased stride length B                                                                                                                                 TREATMENT DATE: 07/26/23: EVAL     PATIENT EDUCATION:  Education details: POC, goals  Person educated: Patient Education method: Explanation, Demonstration, Tactile cues, Verbal cues, and Handouts Education comprehension: verbalized understanding, returned demonstration, verbal cues required, tactile cues required, and needs further education  HOME EXERCISE PROGRAM: TBD  ASSESSMENT:  CLINICAL IMPRESSION: Patient is a 78 y.o. female who was evaluated today by physical therapy evaluation for balance deficits with h/o several falls in the last 2 years.  She presents primarily with dynamic balance deficits , more pronounced with narrowed base of support and single leg support, with scores on Berg and FGA predictive of higher fall risk. She does report general dizziness over the last 2 years but screening for  BPPV non remarkable.  She fatigued very easily today with the evaluation, her activity tolerance is most likely increasing her fall risk.   OBJECTIVE IMPAIRMENTS: decreased activity tolerance, decreased balance, decreased coordination, decreased endurance, difficulty walking, decreased strength, and postural dysfunction.   ACTIVITY LIMITATIONS: carrying, lifting, standing, squatting, transfers, locomotion level, and caring for others  PARTICIPATION LIMITATIONS: meal prep, cleaning, laundry, and community activity  PERSONAL FACTORS:  Age, Behavior pattern, Fitness, Past/current experiences, Time since onset of injury/illness/exacerbation, and 1-2 comorbidities: B TKA, h/o frequent falls  are also affecting patient's functional outcome.   REHAB POTENTIAL: Good  CLINICAL DECISION MAKING: Evolving/moderate complexity  EVALUATION COMPLEXITY: Moderate   GOALS: Goals reviewed with patient? Yes  SHORT TERM GOALS: Target date:  2 weeks, 08/09/23 I HEP Baseline: Goal status: INITIAL  LONG TERM GOALS: Target date: 12 weeks, 10/18/23  Improve Berg from 34/54 to 45/54 or greater  Baseline:  Goal status: INITIAL  2.  Improve FGA from 10/30 to 20/30 or greater Baseline:  Goal status: INITIAL  3.  Improve activity tolerance/endurance, able to walk 800' or greater without fatigue, to improve community ambulation Baseline: TBD Goal status: INITIAL   PLAN:  PT FREQUENCY: 2x/week  PT DURATION: 12 weeks  PLANNED INTERVENTIONS: 97110-Therapeutic exercises, 97530- Therapeutic activity, 97112- Neuromuscular re-education, 97535- Self Care, and 02859- Manual therapy  PLAN FOR NEXT SESSION: initiate cardiovascular endurance, address righting reactions, narrowed base of support, ankle strategies.   Dartha Rozzell L Jeannette Maddy, PT, DPT, OCS 07/26/2023, 4:55 PM

## 2023-07-29 ENCOUNTER — Ambulatory Visit: Admitting: Physical Therapy

## 2023-07-29 ENCOUNTER — Encounter: Payer: Self-pay | Admitting: Physical Therapy

## 2023-07-29 DIAGNOSIS — R296 Repeated falls: Secondary | ICD-10-CM

## 2023-07-29 DIAGNOSIS — R262 Difficulty in walking, not elsewhere classified: Secondary | ICD-10-CM

## 2023-07-29 NOTE — Therapy (Signed)
 OUTPATIENT PHYSICAL THERAPY LOWER EXTREMITY TREATMENT   Patient Name: Tasha Hunt MRN: [id number] DOB:11-22-1945, 78 y.o., female Today's Date: 07/29/2023  END OF SESSION:  PT End of Session - 07/29/23 1347     Visit Number 2    Date for PT Re-Evaluation 10/18/23    PT Start Time 1345    PT Stop Time 1430    PT Time Calculation (min) 45 min    Activity Tolerance Patient tolerated treatment well    Behavior During Therapy [facility] for tasks assessed/performed          Past Medical History:  Diagnosis Date   Allergic rhinitis, cause unspecified    Aneurysm of splenic artery (HCC)    noted since 2002   Anxiety    Arthritis    hands   Asthma    Complication of anesthesia    COPD (chronic obstructive pulmonary disease) (HCC)    Depressive disorder, not elsewhere classified    Dyspnea    Esophageal reflux    H/O hiatal hernia    Hypertension    Obesity, unspecified    Pneumonia    03/27/11 many many years ago   PONV (postoperative nausea and vomiting)    Ulcerative colitis (HCC)    Past Surgical History:  Procedure Laterality Date   ABDOMINAL HYSTERECTOMY  1980   ARTERIAL ANEURYSM REPAIR  03/27/2011   splenic artery   BREAST BIOPSY  2003   right   BREAST BIOPSY  2012   left   BREAST LUMPECTOMY WITH RADIOACTIVE SEED LOCALIZATION Right 07/06/2019   Procedure: RIGHT BREAST LUMPECTOMY WITH RADIOACTIVE SEED LOCALIZATION;  Surgeon: Vanderbilt Ned, MD;  Location: [facility];  Service: General;  Laterality: Right;   CATARACT EXTRACTION W/ INTRAOCULAR LENS IMPLANT Bilateral    CHOLECYSTECTOMY  2002   EMBOLIZATION N/A 03/27/2011   Procedure: EMBOLIZATION;  Surgeon: Carlin FORBES Haddock, MD;  Location: [facility] CATH [facility];  Service: Cardiovascular;  Laterality: N/A;   HERNIA REPAIR     KNEE CLOSED REDUCTION Left 10/24/2021   Procedure: CLOSED MANIPULATION KNEE;  Surgeon: Ernie Cough, MD;  Location: [facility];  Service: Orthopedics;  Laterality: Left;    LAPAROSCOPIC NISSEN FUNDOPLICATION  2002   NASAL SEPTUM SURGERY  1985   deviated septum   REVERSE SHOULDER ARTHROPLASTY Right 11/17/2019   Procedure: REVERSE SHOULDER ARTHROPLASTY;  Surgeon: Kay Kemps, MD;  Location: [facility];  Service: Orthopedics;  Laterality: Right;  interscalene block   TONSILLECTOMY  1948   TOTAL KNEE ARTHROPLASTY Right 04/20/2016   Procedure: RIGHT TOTAL KNEE ARTHROPLASTY;  Surgeon: Cough Ernie, MD;  Location: [facility];  Service: Orthopedics;  Laterality: Right;   TOTAL KNEE ARTHROPLASTY Left 06/19/2021   Patient Active Problem List   Diagnosis Date Noted   SDH (subdural hematoma) (HCC) 07/28/2022   S/P shoulder replacement, right 11/17/2019   S/P right TKA 04/20/2016   Aneurysm of splenic artery (HCC) 03/19/2011    PCP: Ainsley. Truman, MD  REFERRING PROVIDER: Jackson Mow, [place], [place]  REFERRING DIAG: h/o falls  THERAPY DIAG:  Difficulty in walking, not elsewhere classified  Repeated falls  Rationale for Evaluation and Treatment: Rehabilitation  ONSET DATE: most recent 06/30/23  SUBJECTIVE:   SUBJECTIVE STATEMENT:   My leg hurts, Im seeing the orthopedic doctor tomorrow.   I have fallen several times over the last year, have hit my head a few times, most recently I fell backwards trying to open the car door, and landed on back of my head, also I tried  to climb a step and my L leg gave away and I fell on my face. I do feel like I've been swimmy headed  since all of these falls started, about 2 years.  PERTINENT HISTORY: Frequent falls, has undergone knee replacements each knee, also R TSA,. Has arthritis in lower back.  PAIN:  Are you having pain? Yes: NPRS scale: 5/10 Pain location: Lateral R knee Pain description: very stiff, takes a few hours in am to warm up Aggravating factors: prolonged standing, prolonged positioning Relieving factors: heat, meds  PRECAUTIONS: Fall  RED FLAGS: None   WEIGHT BEARING RESTRICTIONS: No  FALLS:   Has patient fallen in last 6 months? Yes several falls   LIVING ENVIRONMENT: Lives with: lives alone Lives in: House/apartment Stairs: No Has following equipment at home: Single point cane and Environmental consultant - 2 wheeled  OCCUPATION: retired  PLOF: Independent  PATIENT GOALS: avoid falls  NEXT MD VISIT: mid August   OBJECTIVE:  Note: Objective measures were completed at Evaluation unless otherwise noted.  DIAGNOSTIC FINDINGS: na    COGNITION: Overall cognitive status: Within functional limits for tasks assessed     SENSATION: WFL   POSTURE: mild B hip flexion, wider than normal base of support, rounded thoracic spine   PALPATION: na  LOWER EXTREMITY ROM: grossly wfl  LOWER EXTREMITY MMT: grossly 4/5 with burst testing throughout     FUNCTIONAL TESTS:  5 times sit to stand: 15.45  BERG 34/54  FGA 10/30 GAIT: Distance walked: 73' in clinic Assistive device utilized: Single point cane Level of assistance: Modified independence Comments: very slow cadence, and gait speed, decreased stride length B                                                                                                                                 TREATMENT DATE:  07/29/23 NuStep L4 x 6 mn Alt 4in box taps 2x8 very unstable during RLE SLS phase  Side steps at mat table   Hs curls 20lb 2x10 Leg Ext 5lb 2x10 Backwards walking Forward marching  07/26/23: EVAL     PATIENT EDUCATION:  Education details: POC, goals  Person educated: Patient Education method: Programmer, multimedia, Demonstration, Tactile cues, Verbal cues, and Handouts Education comprehension: verbalized understanding, returned demonstration, verbal cues required, tactile cues required, and needs further education  HOME EXERCISE PROGRAM: TBD  ASSESSMENT:  CLINICAL IMPRESSION: Patient is a 78 y.o. female who was treated today by physical therapy for balance deficits with h/o several falls in the last 2 years.  Session  consisted of an introduction of balance and Le strength. Instability present with airex intervention and alt box taps. CGA needed at times with alt box taps. Cues for full ROM needed with leg curls and extensions. Some hesitation with backwards walking.  OBJECTIVE IMPAIRMENTS: decreased activity tolerance, decreased balance, decreased coordination, decreased endurance, difficulty walking, decreased strength, and postural dysfunction.   ACTIVITY LIMITATIONS: carrying, lifting, standing, squatting,  transfers, locomotion level, and caring for others  PARTICIPATION LIMITATIONS: meal prep, cleaning, laundry, and community activity  PERSONAL FACTORS: Age, Behavior pattern, Fitness, Past/current experiences, Time since onset of injury/illness/exacerbation, and 1-2 comorbidities: B TKA, h/o frequent falls  are also affecting patient's functional outcome.   REHAB POTENTIAL: Good  CLINICAL DECISION MAKING: Evolving/moderate complexity  EVALUATION COMPLEXITY: Moderate   GOALS: Goals reviewed with patient? Yes  SHORT TERM GOALS: Target date:  2 weeks, 08/09/23 I HEP Baseline: Goal status: INITIAL  LONG TERM GOALS: Target date: 12 weeks, 10/18/23  Improve Berg from 34/54 to 45/54 or greater  Baseline:  Goal status: INITIAL  2.  Improve FGA from 10/30 to 20/30 or greater Baseline:  Goal status: INITIAL  3.  Improve activity tolerance/endurance, able to walk 800' or greater without fatigue, to improve community ambulation Baseline: TBD Goal status: INITIAL   PLAN:  PT FREQUENCY: 2x/week  PT DURATION: 12 weeks  PLANNED INTERVENTIONS: 97110-Therapeutic exercises, 97530- Therapeutic activity, 97112- Neuromuscular re-education, 97535- Self Care, and 02859- Manual therapy  PLAN FOR NEXT SESSION: initiate cardiovascular endurance, address righting reactions, narrowed base of support, ankle strategies.   Tanda KANDICE Sorrow, PTA,  07/29/2023, 1:48 PM

## 2023-08-02 ENCOUNTER — Other Ambulatory Visit: Payer: Self-pay

## 2023-08-02 ENCOUNTER — Ambulatory Visit

## 2023-08-02 DIAGNOSIS — R296 Repeated falls: Secondary | ICD-10-CM

## 2023-08-02 DIAGNOSIS — R262 Difficulty in walking, not elsewhere classified: Secondary | ICD-10-CM

## 2023-08-02 NOTE — Therapy (Signed)
 OUTPATIENT PHYSICAL THERAPY LOWER EXTREMITY TREATMENT   Patient Name: Tasha Hunt MRN: [id number] DOB:03/09/1945, 78 y.o., female Today's Date: 08/02/2023  END OF SESSION:  PT End of Session - 08/02/23 1143     Visit Number 3    Date for PT Re-Evaluation 10/18/23    Progress Note Due on Visit 10    PT Start Time 1102    PT Stop Time 1142    PT Time Calculation (min) 40 min    Activity Tolerance Patient tolerated treatment well    Behavior During Therapy WFL for tasks assessed/performed           Past Medical History:  Diagnosis Date   Allergic rhinitis, cause unspecified    Aneurysm of splenic artery (HCC)    noted since 2002   Anxiety    Arthritis    hands   Asthma    Complication of anesthesia    COPD (chronic obstructive pulmonary disease) (HCC)    Depressive disorder, not elsewhere classified    Dyspnea    Esophageal reflux    H/O hiatal hernia    Hypertension    Obesity, unspecified    Pneumonia    03/27/11 many many years ago   PONV (postoperative nausea and vomiting)    Ulcerative colitis (HCC)    Past Surgical History:  Procedure Laterality Date   ABDOMINAL HYSTERECTOMY  1980   ARTERIAL ANEURYSM REPAIR  03/27/2011   splenic artery   BREAST BIOPSY  2003   right   BREAST BIOPSY  2012   left   BREAST LUMPECTOMY WITH RADIOACTIVE SEED LOCALIZATION Right 07/06/2019   Procedure: RIGHT BREAST LUMPECTOMY WITH RADIOACTIVE SEED LOCALIZATION;  Surgeon: Vanderbilt Ned, MD;  Location: [facility];  Service: General;  Laterality: Right;   CATARACT EXTRACTION W/ INTRAOCULAR LENS IMPLANT Bilateral    CHOLECYSTECTOMY  2002   EMBOLIZATION N/A 03/27/2011   Procedure: EMBOLIZATION;  Surgeon: Carlin FORBES Haddock, MD;  Location: [facility];  Service: Cardiovascular;  Laterality: N/A;   HERNIA REPAIR     KNEE CLOSED REDUCTION Left 10/24/2021   Procedure: CLOSED MANIPULATION KNEE;  Surgeon: Ernie Cough, MD;  Location: [facility];  Service:  Orthopedics;  Laterality: Left;   LAPAROSCOPIC NISSEN FUNDOPLICATION  2002   NASAL SEPTUM SURGERY  1985   deviated septum   REVERSE SHOULDER ARTHROPLASTY Right 11/17/2019   Procedure: REVERSE SHOULDER ARTHROPLASTY;  Surgeon: Kay Kemps, MD;  Location: [facility];  Service: Orthopedics;  Laterality: Right;  interscalene block   TONSILLECTOMY  1948   TOTAL KNEE ARTHROPLASTY Right 04/20/2016   Procedure: RIGHT TOTAL KNEE ARTHROPLASTY;  Surgeon: Cough Ernie, MD;  Location: [facility];  Service: Orthopedics;  Laterality: Right;   TOTAL KNEE ARTHROPLASTY Left 06/19/2021   Patient Active Problem List   Diagnosis Date Noted   SDH (subdural hematoma) (HCC) 07/28/2022   S/P shoulder replacement, right 11/17/2019   S/P right TKA 04/20/2016   Aneurysm of splenic artery (HCC) 03/19/2011    PCP: Ainsley. Truman, MD  REFERRING PROVIDER: Jackson Mow, [place], [place]  REFERRING DIAG: h/o falls  THERAPY DIAG:  Difficulty in walking, not elsewhere classified  Repeated falls  Rationale for Evaluation and Treatment: Rehabilitation  ONSET DATE: most recent 06/30/23  SUBJECTIVE:   SUBJECTIVE STATEMENT:   No new comments, did not state how her appt went with orthopedist   I have fallen several times over the last year, have hit my head a few times, most recently I fell backwards trying to open  the car door, and landed on back of my head, also I tried to climb a step and my L leg gave away and I fell on my face. I do feel like I've been swimmy headed  since all of these falls started, about 2 years.  PERTINENT HISTORY: Frequent falls, has undergone knee replacements each knee, also R TSA,. Has arthritis in lower back.  PAIN:  Are you having pain? Yes: NPRS scale: 5/10 Pain location: Lateral R knee Pain description: very stiff, takes a few hours in am to warm up Aggravating factors: prolonged standing, prolonged positioning Relieving factors: heat, meds  PRECAUTIONS: Fall  RED  FLAGS: None   WEIGHT BEARING RESTRICTIONS: No  FALLS:  Has patient fallen in last 6 months? Yes several falls   LIVING ENVIRONMENT: Lives with: lives alone Lives in: House/apartment Stairs: No Has following equipment at home: Single point cane and Environmental consultant - 2 wheeled  OCCUPATION: retired  PLOF: Independent  PATIENT GOALS: avoid falls  NEXT MD VISIT: mid August   OBJECTIVE:  Note: Objective measures were completed at Evaluation unless otherwise noted.  DIAGNOSTIC FINDINGS: na    COGNITION: Overall cognitive status: Within functional limits for tasks assessed     SENSATION: WFL   POSTURE: mild B hip flexion, wider than normal base of support, rounded thoracic spine   PALPATION: na  LOWER EXTREMITY ROM: grossly wfl  LOWER EXTREMITY MMT: grossly 4/5 with burst testing throughout     FUNCTIONAL TESTS:  5 times sit to stand: 15.45  BERG 34/54  FGA 10/30 GAIT: Distance walked: 35' in clinic Assistive device utilized: Single point cane Level of assistance: Modified independence Comments: very slow cadence, and gait speed, decreased stride length B                                                                                                                                 TREATMENT DATE:  08/02/23:  In ll bars:  Lunges on compliant surface of BOSU 10 x each leg Static standing semi tandem 20 sec holds  Heel/toe rocks on airex in ll bars 15x Knee ext 5# 2 x 15 Knee flexion 20 # 2 x 15 Standing at sink for heel/ toe raises, alt hip abd, alt hip ext, and high marches for home program, provided with written printed instructions Nustep level 5 x 6 min Ue's and LE's  07/29/23 NuStep L4 x 6 mn Alt 4in box taps 2x8 very unstable during RLE SLS phase  Side steps at mat table   Hs curls 20lb 2x10 Leg Ext 5lb 2x10 Backwards walking Forward marching  07/26/23: EVAL     PATIENT EDUCATION:  Education details: POC, goals  Person educated: Patient Education  method: Explanation, Demonstration, Tactile cues, Verbal cues, and Handouts Education comprehension: verbalized understanding, returned demonstration, verbal cues required, tactile cues required, and needs further education  HOME EXERCISE PROGRAM: Access Code: [email or web address] URL: [email or web address] Date: 08/02/2023 Prepared by:  Henritta Mutz  Exercises - Heel Toe Raises with Counter Support  - 1 x daily - 7 x weekly - 3 sets - 10 reps - Standing Hip Abduction with Counter Support  - 1 x daily - 7 x weekly - 3 sets - 10 reps - Standing Hip Extension with Counter Support  - 1 x daily - 7 x weekly - 3 sets - 10 reps - Standing March with Counter Support  - 1 x daily - 7 x weekly - 3 sets - 10 reps TBD  ASSESSMENT:  CLINICAL IMPRESSION: Patient is a 78 y.o. female who participated in physical therapy session for balance deficits with h/o several falls in the last 2 years.  Combined functional strengthening and balance activities and instructed in HEP.  She tolerated well. She did forget her cane today, encouraged to use at all times due to higher fall risk .  She should continue to benefit from physical therapy to address her goals and improve her function.   OBJECTIVE IMPAIRMENTS: decreased activity tolerance, decreased balance, decreased coordination, decreased endurance, difficulty walking, decreased strength, and postural dysfunction.   ACTIVITY LIMITATIONS: carrying, lifting, standing, squatting, transfers, locomotion level, and caring for others  PARTICIPATION LIMITATIONS: meal prep, cleaning, laundry, and community activity  PERSONAL FACTORS: Age, Behavior pattern, Fitness, Past/current experiences, Time since onset of injury/illness/exacerbation, and 1-2 comorbidities: B TKA, h/o frequent falls  are also affecting patient's functional outcome.   REHAB POTENTIAL: Good  CLINICAL DECISION MAKING: Evolving/moderate complexity  EVALUATION COMPLEXITY:  Moderate   GOALS: Goals reviewed with patient? Yes  SHORT TERM GOALS: Target date:  2 weeks, 08/09/23 I HEP Baseline: Goal status: INITIAL  LONG TERM GOALS: Target date: 12 weeks, 10/18/23  Improve Berg from 34/54 to 45/54 or greater  Baseline:  Goal status: INITIAL  2.  Improve FGA from 10/30 to 20/30 or greater Baseline:  Goal status: INITIAL  3.  Improve activity tolerance/endurance, able to walk 800' or greater without fatigue, to improve community ambulation Baseline: TBD Goal status: INITIAL   PLAN:  PT FREQUENCY: 2x/week  PT DURATION: 12 weeks  PLANNED INTERVENTIONS: 97110-Therapeutic exercises, 97530- Therapeutic activity, 97112- Neuromuscular re-education, 97535- Self Care, and 02859- Manual therapy  PLAN FOR NEXT SESSION: initiate cardiovascular endurance, address righting reactions, narrowed base of support, ankle strategies.   Ardith Test L Dannia Snook, PT, DPT, OCS 08/02/2023, 11:44 AM

## 2023-08-11 ENCOUNTER — Encounter: Payer: Self-pay | Admitting: Physical Therapy

## 2023-08-11 ENCOUNTER — Ambulatory Visit: Admitting: Physical Therapy

## 2023-08-11 DIAGNOSIS — R296 Repeated falls: Secondary | ICD-10-CM

## 2023-08-11 DIAGNOSIS — R262 Difficulty in walking, not elsewhere classified: Secondary | ICD-10-CM | POA: Diagnosis not present

## 2023-08-11 NOTE — Therapy (Signed)
 OUTPATIENT PHYSICAL THERAPY LOWER EXTREMITY TREATMENT   Patient Name: Tasha Hunt MRN: [id number] DOB:11/27/1945, 78 y.o., female Today's Date: 08/11/2023  END OF SESSION:  PT End of Session - 08/11/23 1349     Visit Number 4    Date for PT Re-Evaluation 10/18/23    PT Start Time 1349    PT Stop Time 1430    PT Time Calculation (min) 41 min    Activity Tolerance Patient tolerated treatment well    Behavior During Therapy [facility] for tasks assessed/performed           Past Medical History:  Diagnosis Date   Allergic rhinitis, cause unspecified    Aneurysm of splenic artery (HCC)    noted since 2002   Anxiety    Arthritis    hands   Asthma    Complication of anesthesia    COPD (chronic obstructive pulmonary disease) (HCC)    Depressive disorder, not elsewhere classified    Dyspnea    Esophageal reflux    H/O hiatal hernia    Hypertension    Obesity, unspecified    Pneumonia    03/27/11 many many years ago   PONV (postoperative nausea and vomiting)    Ulcerative colitis (HCC)    Past Surgical History:  Procedure Laterality Date   ABDOMINAL HYSTERECTOMY  1980   ARTERIAL ANEURYSM REPAIR  03/27/2011   splenic artery   BREAST BIOPSY  2003   right   BREAST BIOPSY  2012   left   BREAST LUMPECTOMY WITH RADIOACTIVE SEED LOCALIZATION Right 07/06/2019   Procedure: RIGHT BREAST LUMPECTOMY WITH RADIOACTIVE SEED LOCALIZATION;  Surgeon: Vanderbilt Ned, MD;  Location: [facility];  Service: General;  Laterality: Right;   CATARACT EXTRACTION W/ INTRAOCULAR LENS IMPLANT Bilateral    CHOLECYSTECTOMY  2002   EMBOLIZATION N/A 03/27/2011   Procedure: EMBOLIZATION;  Surgeon: Carlin FORBES Haddock, MD;  Location: [facility] CATH LAB;  Service: Cardiovascular;  Laterality: N/A;   HERNIA REPAIR     KNEE CLOSED REDUCTION Left 10/24/2021   Procedure: CLOSED MANIPULATION KNEE;  Surgeon: Ernie Cough, MD;  Location: [facility];  Service: Orthopedics;  Laterality: Left;    LAPAROSCOPIC NISSEN FUNDOPLICATION  2002   NASAL SEPTUM SURGERY  1985   deviated septum   REVERSE SHOULDER ARTHROPLASTY Right 11/17/2019   Procedure: REVERSE SHOULDER ARTHROPLASTY;  Surgeon: Kay Kemps, MD;  Location: [facility];  Service: Orthopedics;  Laterality: Right;  interscalene block   TONSILLECTOMY  1948   TOTAL KNEE ARTHROPLASTY Right 04/20/2016   Procedure: RIGHT TOTAL KNEE ARTHROPLASTY;  Surgeon: Cough Ernie, MD;  Location: [facility];  Service: Orthopedics;  Laterality: Right;   TOTAL KNEE ARTHROPLASTY Left 06/19/2021   Patient Active Problem List   Diagnosis Date Noted   SDH (subdural hematoma) (HCC) 07/28/2022   S/P shoulder replacement, right 11/17/2019   S/P right TKA 04/20/2016   Aneurysm of splenic artery (HCC) 03/19/2011    PCP: Ainsley. Truman, MD  REFERRING PROVIDER: Jackson Mow, [place], [place]  REFERRING DIAG: h/o falls  THERAPY DIAG:  Difficulty in walking, not elsewhere classified  Repeated falls  Rationale for Evaluation and Treatment: Rehabilitation  ONSET DATE: most recent 06/30/23  SUBJECTIVE:   SUBJECTIVE STATEMENT:  Got her denial, only approving therapy through 9/1, Legs hurt today took tylenol  before she left home  I have fallen several times over the last year, have hit my head a few times, most recently I fell backwards trying to open the car door, and landed on  back of my head, also I tried to climb a step and my L leg gave away and I fell on my face. I do feel like I've been swimmy headed  since all of these falls started, about 2 years.  PERTINENT HISTORY: Frequent falls, has undergone knee replacements each knee, also R TSA,. Has arthritis in lower back.  PAIN:  Are you having pain? Yes: NPRS scale: 5/10 Pain location: Lateral R knee Pain description: very stiff, takes a few hours in am to warm up Aggravating factors: prolonged standing, prolonged positioning Relieving factors: heat, meds  PRECAUTIONS: Fall  RED  FLAGS: None   WEIGHT BEARING RESTRICTIONS: No  FALLS:  Has patient fallen in last 6 months? Yes several falls   LIVING ENVIRONMENT: Lives with: lives alone Lives in: House/apartment Stairs: No Has following equipment at home: Single point cane and Environmental consultant - 2 wheeled  OCCUPATION: retired  PLOF: Independent  PATIENT GOALS: avoid falls  NEXT MD VISIT: mid August   OBJECTIVE:  Note: Objective measures were completed at Evaluation unless otherwise noted.  DIAGNOSTIC FINDINGS: na    COGNITION: Overall cognitive status: Within functional limits for tasks assessed     SENSATION: WFL   POSTURE: mild B hip flexion, wider than normal base of support, rounded thoracic spine   PALPATION: na  LOWER EXTREMITY ROM: grossly wfl  LOWER EXTREMITY MMT: grossly 4/5 with burst testing throughout     FUNCTIONAL TESTS:  5 times sit to stand: 15.45  BERG 34/54  FGA 10/30 GAIT: Distance walked: 22' in clinic Assistive device utilized: Single point cane Level of assistance: Modified independence Comments: very slow cadence, and gait speed, decreased stride length B                                                                                                                                 TREATMENT DATE:  08/11/23 NuStep L 5 x 6 min  20lb resisted gait 4 way x 3 each HS curls 25lb 2x10 Leg Ext 10lb 2x10 Sit to stand holding yellow ball 2x10  08/02/23:  In ll bars:  Lunges on compliant surface of BOSU 10 x each leg Static standing semi tandem 20 sec holds  Heel/toe rocks on airex in ll bars 15x Knee ext 5# 2 x 15 Knee flexion 20 # 2 x 15 Standing at sink for heel/ toe raises, alt hip abd, alt hip ext, and high marches for home program, provided with written printed instructions Nustep level 5 x 6 min Ue's and LE's  07/29/23 NuStep L4 x 6 mn Alt 4in box taps 2x8 very unstable during RLE SLS phase  Side steps at mat table   Hs curls 20lb 2x10 Leg Ext 5lb  2x10 Backwards walking Forward marching  07/26/23: EVAL     PATIENT EDUCATION:  Education details: POC, goals  Person educated: Patient Education method: Explanation, Demonstration, Tactile cues, Verbal cues, and Handouts Education comprehension: verbalized  understanding, returned demonstration, verbal cues required, tactile cues required, and needs further education  HOME EXERCISE PROGRAM: Access Code: [email or web address] URL: [email or web address] Date: 08/02/2023 Prepared by: Greig Speaks  Exercises - Heel Toe Raises with Counter Support  - 1 x daily - 7 x weekly - 3 sets - 10 reps - Standing Hip Abduction with Counter Support  - 1 x daily - 7 x weekly - 3 sets - 10 reps - Standing Hip Extension with Counter Support  - 1 x daily - 7 x weekly - 3 sets - 10 reps - Standing March with Counter Support  - 1 x daily - 7 x weekly - 3 sets - 10 reps TBD  ASSESSMENT:  CLINICAL IMPRESSION: Patient is a 78 y.o. female who participated in physical therapy session for balance deficits with h/o several falls in the last 2 years.  Combined functional strengthening and balance activities.  She tolerated well. CGA needed with resisted gait .Cues for full ROM needed with leg curls and extentions.  She should continue to benefit from physical therapy to address her goals and improve her function.   OBJECTIVE IMPAIRMENTS: decreased activity tolerance, decreased balance, decreased coordination, decreased endurance, difficulty walking, decreased strength, and postural dysfunction.   ACTIVITY LIMITATIONS: carrying, lifting, standing, squatting, transfers, locomotion level, and caring for others  PARTICIPATION LIMITATIONS: meal prep, cleaning, laundry, and community activity  PERSONAL FACTORS: Age, Behavior pattern, Fitness, Past/current experiences, Time since onset of injury/illness/exacerbation, and 1-2 comorbidities: B TKA, h/o frequent falls  are also affecting patient's functional outcome.    REHAB POTENTIAL: Good  CLINICAL DECISION MAKING: Evolving/moderate complexity  EVALUATION COMPLEXITY: Moderate   GOALS: Goals reviewed with patient? Yes  SHORT TERM GOALS: Target date:  2 weeks, 08/09/23 I HEP Baseline: Goal status: Met 08/11/23  LONG TERM GOALS: Target date: 12 weeks, 10/18/23  Improve Berg from 34/54 to 45/54 or greater  Baseline:  Goal status: INITIAL  2.  Improve FGA from 10/30 to 20/30 or greater Baseline:  Goal status: INITIAL  3.  Improve activity tolerance/endurance, able to walk 800' or greater without fatigue, to improve community ambulation Baseline: TBD Goal status: INITIAL   PLAN:  PT FREQUENCY: 2x/week  PT DURATION: 12 weeks  PLANNED INTERVENTIONS: 97110-Therapeutic exercises, 97530- Therapeutic activity, 97112- Neuromuscular re-education, 97535- Self Care, and 02859- Manual therapy  PLAN FOR NEXT SESSION: initiate cardiovascular endurance, address righting reactions, narrowed base of support, ankle strategies.   Tanda KANDICE Sorrow, PTA 08/11/2023, 1:50 PM

## 2023-08-12 NOTE — Therapy (Incomplete)
 OUTPATIENT PHYSICAL THERAPY LOWER EXTREMITY TREATMENT   Patient Name: Tasha Hunt MRN: [id number] DOB:05-Jul-1945, 78 y.o., female Today's Date: 08/12/2023  END OF SESSION:     Past Medical History:  Diagnosis Date   Allergic rhinitis, cause unspecified    Aneurysm of splenic artery (HCC)    noted since 2002   Anxiety    Arthritis    hands   Asthma    Complication of anesthesia    COPD (chronic obstructive pulmonary disease) (HCC)    Depressive disorder, not elsewhere classified    Dyspnea    Esophageal reflux    H/O hiatal hernia    Hypertension    Obesity, unspecified    Pneumonia    03/27/11 many many years ago   PONV (postoperative nausea and vomiting)    Ulcerative colitis (HCC)    Past Surgical History:  Procedure Laterality Date   ABDOMINAL HYSTERECTOMY  1980   ARTERIAL ANEURYSM REPAIR  03/27/2011   splenic artery   BREAST BIOPSY  2003   right   BREAST BIOPSY  2012   left   BREAST LUMPECTOMY WITH RADIOACTIVE SEED LOCALIZATION Right 07/06/2019   Procedure: RIGHT BREAST LUMPECTOMY WITH RADIOACTIVE SEED LOCALIZATION;  Surgeon: Vanderbilt Ned, MD;  Location: [facility];  Service: General;  Laterality: Right;   CATARACT EXTRACTION W/ INTRAOCULAR LENS IMPLANT Bilateral    CHOLECYSTECTOMY  2002   EMBOLIZATION N/A 03/27/2011   Procedure: EMBOLIZATION;  Surgeon: Carlin FORBES Haddock, MD;  Location: [facility] CATH LAB;  Service: Cardiovascular;  Laterality: N/A;   HERNIA REPAIR     KNEE CLOSED REDUCTION Left 10/24/2021   Procedure: CLOSED MANIPULATION KNEE;  Surgeon: Ernie Cough, MD;  Location: [facility];  Service: Orthopedics;  Laterality: Left;   LAPAROSCOPIC NISSEN FUNDOPLICATION  2002   NASAL SEPTUM SURGERY  1985   deviated septum   REVERSE SHOULDER ARTHROPLASTY Right 11/17/2019   Procedure: REVERSE SHOULDER ARTHROPLASTY;  Surgeon: Kay Kemps, MD;  Location: [facility];  Service: Orthopedics;  Laterality: Right;  interscalene block   TONSILLECTOMY   1948   TOTAL KNEE ARTHROPLASTY Right 04/20/2016   Procedure: RIGHT TOTAL KNEE ARTHROPLASTY;  Surgeon: Cough Ernie, MD;  Location: [facility];  Service: Orthopedics;  Laterality: Right;   TOTAL KNEE ARTHROPLASTY Left 06/19/2021   Patient Active Problem List   Diagnosis Date Noted   SDH (subdural hematoma) (HCC) 07/28/2022   S/P shoulder replacement, right 11/17/2019   S/P right TKA 04/20/2016   Aneurysm of splenic artery (HCC) 03/19/2011    PCP: Ainsley. Truman, MD  REFERRING PROVIDER: Jackson Mow, [place], [place]  REFERRING DIAG: h/o falls  THERAPY DIAG:  No diagnosis found.  Rationale for Evaluation and Treatment: Rehabilitation  ONSET DATE: most recent 06/30/23  SUBJECTIVE:   SUBJECTIVE STATEMENT:  Got her denial, only approving therapy through 9/1, Legs hurt today took tylenol  before she left home  I have fallen several times over the last year, have hit my head a few times, most recently I fell backwards trying to open the car door, and landed on back of my head, also I tried to climb a step and my L leg gave away and I fell on my face. I do feel like I've been swimmy headed  since all of these falls started, about 2 years.  PERTINENT HISTORY: Frequent falls, has undergone knee replacements each knee, also R TSA,. Has arthritis in lower back.  PAIN:  Are you having pain? Yes: NPRS scale: 5/10 Pain location: Lateral R knee Pain  description: very stiff, takes a few hours in am to warm up Aggravating factors: prolonged standing, prolonged positioning Relieving factors: heat, meds  PRECAUTIONS: Fall  RED FLAGS: None   WEIGHT BEARING RESTRICTIONS: No  FALLS:  Has patient fallen in last 6 months? Yes several falls   LIVING ENVIRONMENT: Lives with: lives alone Lives in: House/apartment Stairs: No Has following equipment at home: Single point cane and Environmental consultant - 2 wheeled  OCCUPATION: retired  PLOF: Independent  PATIENT GOALS: avoid falls  NEXT MD VISIT: mid  August   OBJECTIVE:  Note: Objective measures were completed at Evaluation unless otherwise noted.  DIAGNOSTIC FINDINGS: na    COGNITION: Overall cognitive status: Within functional limits for tasks assessed     SENSATION: WFL   POSTURE: mild B hip flexion, wider than normal base of support, rounded thoracic spine   PALPATION: na  LOWER EXTREMITY ROM: grossly wfl  LOWER EXTREMITY MMT: grossly 4/5 with burst testing throughout     FUNCTIONAL TESTS:  5 times sit to stand: 15.45  BERG 34/54  FGA 10/30 GAIT: Distance walked: 41' in clinic Assistive device utilized: Single point cane Level of assistance: Modified independence Comments: very slow cadence, and gait speed, decreased stride length B                                                                                                                                 TREATMENT DATE:  08/13/23 NuStep STS Step ups On airex balance, then with eyes closed Marching on airex HS curls Leg ext  08/11/23 NuStep L 5 x 6 min  20lb resisted gait 4 way x 3 each HS curls 25lb 2x10 Leg Ext 10lb 2x10 Sit to stand holding yellow ball 2x10  08/02/23:  In ll bars:  Lunges on compliant surface of BOSU 10 x each leg Static standing semi tandem 20 sec holds  Heel/toe rocks on airex in ll bars 15x Knee ext 5# 2 x 15 Knee flexion 20 # 2 x 15 Standing at sink for heel/ toe raises, alt hip abd, alt hip ext, and high marches for home program, provided with written printed instructions Nustep level 5 x 6 min Ue's and LE's  07/29/23 NuStep L4 x 6 mn Alt 4in box taps 2x8 very unstable during RLE SLS phase  Side steps at mat table   Hs curls 20lb 2x10 Leg Ext 5lb 2x10 Backwards walking Forward marching  07/26/23: EVAL     PATIENT EDUCATION:  Education details: POC, goals  Person educated: Patient Education method: Explanation, Demonstration, Tactile cues, Verbal cues, and Handouts Education comprehension: verbalized  understanding, returned demonstration, verbal cues required, tactile cues required, and needs further education  HOME EXERCISE PROGRAM: Access Code: [email or web address] URL: [email or web address] Date: 08/02/2023 Prepared by: Greig Speaks  Exercises - Heel Toe Raises with Counter Support  - 1 x daily - 7 x weekly - 3 sets - 10 reps - Standing Hip Abduction with  Counter Support  - 1 x daily - 7 x weekly - 3 sets - 10 reps - Standing Hip Extension with Counter Support  - 1 x daily - 7 x weekly - 3 sets - 10 reps - Standing March with Counter Support  - 1 x daily - 7 x weekly - 3 sets - 10 reps TBD  ASSESSMENT:  CLINICAL IMPRESSION: Patient is a 78 y.o. female who participated in physical therapy session for balance deficits with h/o several falls in the last 2 years.  Combined functional strengthening and balance activities.  She tolerated well. CGA needed with resisted gait .Cues for full ROM needed with leg curls and extentions.  She should continue to benefit from physical therapy to address her goals and improve her function.   OBJECTIVE IMPAIRMENTS: decreased activity tolerance, decreased balance, decreased coordination, decreased endurance, difficulty walking, decreased strength, and postural dysfunction.   ACTIVITY LIMITATIONS: carrying, lifting, standing, squatting, transfers, locomotion level, and caring for others  PARTICIPATION LIMITATIONS: meal prep, cleaning, laundry, and community activity  PERSONAL FACTORS: Age, Behavior pattern, Fitness, Past/current experiences, Time since onset of injury/illness/exacerbation, and 1-2 comorbidities: B TKA, h/o frequent falls  are also affecting patient's functional outcome.   REHAB POTENTIAL: Good  CLINICAL DECISION MAKING: Evolving/moderate complexity  EVALUATION COMPLEXITY: Moderate   GOALS: Goals reviewed with patient? Yes  SHORT TERM GOALS: Target date:  2 weeks, 08/09/23 I HEP Baseline: Goal status: Met 08/11/23  LONG  TERM GOALS: Target date: 12 weeks, 10/18/23  Improve Berg from 34/54 to 45/54 or greater  Baseline:  Goal status: INITIAL  2.  Improve FGA from 10/30 to 20/30 or greater Baseline:  Goal status: INITIAL  3.  Improve activity tolerance/endurance, able to walk 800' or greater without fatigue, to improve community ambulation Baseline: TBD Goal status: INITIAL   PLAN:  PT FREQUENCY: 2x/week  PT DURATION: 12 weeks  PLANNED INTERVENTIONS: 97110-Therapeutic exercises, 97530- Therapeutic activity, 97112- Neuromuscular re-education, 97535- Self Care, and 02859- Manual therapy  PLAN FOR NEXT SESSION: initiate cardiovascular endurance, address righting reactions, narrowed base of support, ankle strategies.   [place], PT 08/12/2023, 12:14 PM

## 2023-08-13 ENCOUNTER — Encounter

## 2023-08-17 ENCOUNTER — Ambulatory Visit: Admitting: Physical Therapy

## 2023-08-17 ENCOUNTER — Encounter: Payer: Self-pay | Admitting: Physical Therapy

## 2023-08-17 DIAGNOSIS — R262 Difficulty in walking, not elsewhere classified: Secondary | ICD-10-CM | POA: Diagnosis not present

## 2023-08-17 DIAGNOSIS — R296 Repeated falls: Secondary | ICD-10-CM

## 2023-08-17 NOTE — Therapy (Signed)
 OUTPATIENT PHYSICAL THERAPY LOWER EXTREMITY TREATMENT   Patient Name: Tasha Hunt MRN: [id number] DOB:06/29/1945, 78 y.o., female Today's Date: 08/17/2023  END OF SESSION:  PT End of Session - 08/17/23 1100     Visit Number 5    Date for PT Re-Evaluation 10/18/23    PT Start Time 1100    PT Stop Time 1145    PT Time Calculation (min) 45 min    Activity Tolerance Patient tolerated treatment well    Behavior During Therapy WFL for tasks assessed/performed           Past Medical History:  Diagnosis Date   Allergic rhinitis, cause unspecified    Aneurysm of splenic artery (HCC)    noted since 2002   Anxiety    Arthritis    hands   Asthma    Complication of anesthesia    COPD (chronic obstructive pulmonary disease) (HCC)    Depressive disorder, not elsewhere classified    Dyspnea    Esophageal reflux    H/O hiatal hernia    Hypertension    Obesity, unspecified    Pneumonia    03/27/11 many many years ago   PONV (postoperative nausea and vomiting)    Ulcerative colitis (HCC)    Past Surgical History:  Procedure Laterality Date   ABDOMINAL HYSTERECTOMY  1980   ARTERIAL ANEURYSM REPAIR  03/27/2011   splenic artery   BREAST BIOPSY  2003   right   BREAST BIOPSY  2012   left   BREAST LUMPECTOMY WITH RADIOACTIVE SEED LOCALIZATION Right 07/06/2019   Procedure: RIGHT BREAST LUMPECTOMY WITH RADIOACTIVE SEED LOCALIZATION;  Surgeon: Vanderbilt Ned, MD;  Location: [facility];  Service: General;  Laterality: Right;   CATARACT EXTRACTION W/ INTRAOCULAR LENS IMPLANT Bilateral    CHOLECYSTECTOMY  2002   EMBOLIZATION N/A 03/27/2011   Procedure: EMBOLIZATION;  Surgeon: Carlin FORBES Haddock, MD;  Location: [facility] CATH LAB;  Service: Cardiovascular;  Laterality: N/A;   HERNIA REPAIR     KNEE CLOSED REDUCTION Left 10/24/2021   Procedure: CLOSED MANIPULATION KNEE;  Surgeon: Ernie Cough, MD;  Location: [facility];  Service: Orthopedics;  Laterality: Left;    LAPAROSCOPIC NISSEN FUNDOPLICATION  2002   NASAL SEPTUM SURGERY  1985   deviated septum   REVERSE SHOULDER ARTHROPLASTY Right 11/17/2019   Procedure: REVERSE SHOULDER ARTHROPLASTY;  Surgeon: Kay Kemps, MD;  Location: [facility];  Service: Orthopedics;  Laterality: Right;  interscalene block   TONSILLECTOMY  1948   TOTAL KNEE ARTHROPLASTY Right 04/20/2016   Procedure: RIGHT TOTAL KNEE ARTHROPLASTY;  Surgeon: Cough Ernie, MD;  Location: [facility];  Service: Orthopedics;  Laterality: Right;   TOTAL KNEE ARTHROPLASTY Left 06/19/2021   Patient Active Problem List   Diagnosis Date Noted   SDH (subdural hematoma) (HCC) 07/28/2022   S/P shoulder replacement, right 11/17/2019   S/P right TKA 04/20/2016   Aneurysm of splenic artery (HCC) 03/19/2011    PCP: Ainsley. Truman, MD  REFERRING PROVIDER: Jackson Mow, [place], [place]  REFERRING DIAG: h/o falls  THERAPY DIAG:  Difficulty in walking, not elsewhere classified  Repeated falls  Rationale for Evaluation and Treatment: Rehabilitation  ONSET DATE: most recent 06/30/23  SUBJECTIVE:   SUBJECTIVE STATEMENT:  Some anterior R shoulder soreness.  I have fallen several times over the last year, have hit my head a few times, most recently I fell backwards trying to open the car door, and landed on back of my head, also I tried to climb a step and  my L leg gave away and I fell on my face. I do feel like I've been swimmy headed  since all of these falls started, about 2 years.  PERTINENT HISTORY: Frequent falls, has undergone knee replacements each knee, also R TSA,. Has arthritis in lower back.  PAIN:  Are you having pain? Yes: NPRS scale: 0/10 Pain location: Lateral R knee Pain description: very stiff, takes a few hours in am to warm up Aggravating factors: prolonged standing, prolonged positioning Relieving factors: heat, meds  PRECAUTIONS: Fall  RED FLAGS: None   WEIGHT BEARING RESTRICTIONS: No  FALLS:  Has patient fallen in  last 6 months? Yes several falls   LIVING ENVIRONMENT: Lives with: lives alone Lives in: House/apartment Stairs: No Has following equipment at home: Single point cane and Environmental consultant - 2 wheeled  OCCUPATION: retired  PLOF: Independent  PATIENT GOALS: avoid falls  NEXT MD VISIT: mid August   OBJECTIVE:  Note: Objective measures were completed at Evaluation unless otherwise noted.  DIAGNOSTIC FINDINGS: na    COGNITION: Overall cognitive status: Within functional limits for tasks assessed     SENSATION: WFL   POSTURE: mild B hip flexion, wider than normal base of support, rounded thoracic spine   PALPATION: na  LOWER EXTREMITY ROM: grossly wfl  LOWER EXTREMITY MMT: grossly 4/5 with burst testing throughout     FUNCTIONAL TESTS:  5 times sit to stand: 15.45  BERG 34/54  FGA 10/30 GAIT: Distance walked: 36' in clinic Assistive device utilized: Single point cane Level of assistance: Modified independence Comments: very slow cadence, and gait speed, decreased stride length B                                                                                                                                 TREATMENT DATE:  08/17/23 NuStep L 5 x 6 min  Sit to stand x10, on airex  Slt 6 in box taps on airex x10 Side steps at mat  Side steps over T band HS curls 25lb 2x12 BERG 39/56  08/11/23 NuStep L 5 x 6 min  20lb resisted gait 4 way x 3 each HS curls 25lb 2x10 Leg Ext 10lb 2x10 Sit to stand holding yellow ball 2x10  08/02/23:  In ll bars:  Lunges on compliant surface of BOSU 10 x each leg Static standing semi tandem 20 sec holds  Heel/toe rocks on airex in ll bars 15x Knee ext 5# 2 x 15 Knee flexion 20 # 2 x 15 Standing at sink for heel/ toe raises, alt hip abd, alt hip ext, and high marches for home program, provided with written printed instructions Nustep level 5 x 6 min Ue's and LE's  07/29/23 NuStep L4 x 6 mn Alt 4in box taps 2x8 very unstable during  RLE SLS phase  Side steps at mat table   Hs curls 20lb 2x10 Leg Ext 5lb 2x10 Backwards walking Forward marching  PATIENT EDUCATION:  Education details: POC, goals  Person educated: Patient Education method: Explanation, Demonstration, Tactile cues, Verbal cues, and Handouts Education comprehension: verbalized understanding, returned demonstration, verbal cues required, tactile cues required, and needs further education  HOME EXERCISE PROGRAM: Access Code: [email or web address] URL: [email or web address] Date: 08/02/2023 Prepared by: Greig Speaks  Exercises - Heel Toe Raises with Counter Support  - 1 x daily - 7 x weekly - 3 sets - 10 reps - Standing Hip Abduction with Counter Support  - 1 x daily - 7 x weekly - 3 sets - 10 reps - Standing Hip Extension with Counter Support  - 1 x daily - 7 x weekly - 3 sets - 10 reps - Standing March with Counter Support  - 1 x daily - 7 x weekly - 3 sets - 10 reps TBD  ASSESSMENT:  CLINICAL IMPRESSION: Patient is a 78 y.o. female who participated in physical therapy session for balance deficits with h/o several falls in the last 2 years.  NuStep completed with LE only due to subjective reports of anterior shoulder discomfort. She has progressed increasing her BERG balance score. Combined functional strengthening and balance activities.  She tolerated well. CGA needed with Alt box taps.Cues for full ROM needed with leg curls and extentions.  She should continue to benefit from physical therapy to address her goals and improve her function.   OBJECTIVE IMPAIRMENTS: decreased activity tolerance, decreased balance, decreased coordination, decreased endurance, difficulty walking, decreased strength, and postural dysfunction.   ACTIVITY LIMITATIONS: carrying, lifting, standing, squatting, transfers, locomotion level, and caring for others  PARTICIPATION LIMITATIONS: meal prep, cleaning, laundry, and community activity  PERSONAL FACTORS: Age, Behavior  pattern, Fitness, Past/current experiences, Time since onset of injury/illness/exacerbation, and 1-2 comorbidities: B TKA, h/o frequent falls  are also affecting patient's functional outcome.   REHAB POTENTIAL: Good  CLINICAL DECISION MAKING: Evolving/moderate complexity  EVALUATION COMPLEXITY: Moderate   GOALS: Goals reviewed with patient? Yes  SHORT TERM GOALS: Target date:  2 weeks, 08/09/23 I HEP Baseline: Goal status: Met 08/11/23  LONG TERM GOALS: Target date: 12 weeks, 10/18/23  Improve Berg from 34/54 to 45/54 or greater  Baseline:  Goal status: Progressing 08/17/23  2.  Improve FGA from 10/30 to 20/30 or greater Baseline:  Goal status: INITIAL  3.  Improve activity tolerance/endurance, able to walk 800' or greater without fatigue, to improve community ambulation Baseline: TBD Goal status: INITIAL   PLAN:  PT FREQUENCY: 2x/week  PT DURATION: 12 weeks  PLANNED INTERVENTIONS: 97110-Therapeutic exercises, 97530- Therapeutic activity, 97112- Neuromuscular re-education, 97535- Self Care, and 02859- Manual therapy  PLAN FOR NEXT SESSION: initiate cardiovascular endurance, address righting reactions, narrowed base of support, ankle strategies.   Tanda KANDICE Sorrow, PTA 08/17/2023, 11:00 AM

## 2023-08-20 NOTE — Progress Notes (Signed)
 Called and spoke to patient per provider's message/recommendations. All questions answered. The patient indicates understanding and agrees with the plan.

## 2023-08-23 ENCOUNTER — Ambulatory Visit: Admitting: Physical Therapy

## 2023-08-25 ENCOUNTER — Ambulatory Visit: Attending: Physician Assistant | Admitting: Physical Therapy

## 2023-08-25 ENCOUNTER — Encounter: Payer: Self-pay | Admitting: Physical Therapy

## 2023-08-25 DIAGNOSIS — R262 Difficulty in walking, not elsewhere classified: Secondary | ICD-10-CM | POA: Insufficient documentation

## 2023-08-25 DIAGNOSIS — R296 Repeated falls: Secondary | ICD-10-CM | POA: Diagnosis present

## 2023-08-25 NOTE — Therapy (Signed)
 OUTPATIENT PHYSICAL THERAPY LOWER EXTREMITY PROGRESS NOTE    Patient Name: Tasha Hunt MRN: [id number] DOB:05-29-1945, 78 y.o., female Today's Date: 08/25/2023  Progress Note Reporting Period 07/26/23 to 08/25/23  See note below for Objective Data and Assessment of Progress/Goals.      END OF SESSION:  PT End of Session - 08/25/23 1046     Visit Number 6    Date for PT Re-Evaluation 10/18/23    Authorization Type [employer] MCR    Progress Note Due on Visit 16   progress note done visit 6 after findings of CVA   PT Start Time 1019    PT Stop Time 1057    PT Time Calculation (min) 38 min    Activity Tolerance Patient tolerated treatment well    Behavior During Therapy WFL for tasks assessed/performed            Past Medical History:  Diagnosis Date   Allergic rhinitis, cause unspecified    Aneurysm of splenic artery (HCC)    noted since 2002   Anxiety    Arthritis    hands   Asthma    Complication of anesthesia    COPD (chronic obstructive pulmonary disease) (HCC)    Depressive disorder, not elsewhere classified    Dyspnea    Esophageal reflux    H/O hiatal hernia    Hypertension    Obesity, unspecified    Pneumonia    03/27/11 many many years ago   PONV (postoperative nausea and vomiting)    Ulcerative colitis (HCC)    Past Surgical History:  Procedure Laterality Date   ABDOMINAL HYSTERECTOMY  1980   ARTERIAL ANEURYSM REPAIR  03/27/2011   splenic artery   BREAST BIOPSY  2003   right   BREAST BIOPSY  2012   left   BREAST LUMPECTOMY WITH RADIOACTIVE SEED LOCALIZATION Right 07/06/2019   Procedure: RIGHT BREAST LUMPECTOMY WITH RADIOACTIVE SEED LOCALIZATION;  Surgeon: Vanderbilt Ned, MD;  Location: [facility];  Service: General;  Laterality: Right;   CATARACT EXTRACTION W/ INTRAOCULAR LENS IMPLANT Bilateral    CHOLECYSTECTOMY  2002   EMBOLIZATION N/A 03/27/2011   Procedure: EMBOLIZATION;  Surgeon: Carlin FORBES Haddock, MD;  Location: [facility]  CATH LAB;  Service: Cardiovascular;  Laterality: N/A;   HERNIA REPAIR     KNEE CLOSED REDUCTION Left 10/24/2021   Procedure: CLOSED MANIPULATION KNEE;  Surgeon: Ernie Cough, MD;  Location: [facility];  Service: Orthopedics;  Laterality: Left;   LAPAROSCOPIC NISSEN FUNDOPLICATION  2002   NASAL SEPTUM SURGERY  1985   deviated septum   REVERSE SHOULDER ARTHROPLASTY Right 11/17/2019   Procedure: REVERSE SHOULDER ARTHROPLASTY;  Surgeon: Kay Kemps, MD;  Location: [facility];  Service: Orthopedics;  Laterality: Right;  interscalene block   TONSILLECTOMY  1948   TOTAL KNEE ARTHROPLASTY Right 04/20/2016   Procedure: RIGHT TOTAL KNEE ARTHROPLASTY;  Surgeon: Cough Ernie, MD;  Location: [facility];  Service: Orthopedics;  Laterality: Right;   TOTAL KNEE ARTHROPLASTY Left 06/19/2021   Patient Active Problem List   Diagnosis Date Noted   SDH (subdural hematoma) (HCC) 07/28/2022   S/P shoulder replacement, right 11/17/2019   S/P right TKA 04/20/2016   Aneurysm of splenic artery (HCC) 03/19/2011    PCP: Ainsley. Truman, MD  REFERRING PROVIDER: Jackson Mow, [place], [place]  REFERRING DIAG: h/o falls  THERAPY DIAG:  Difficulty in walking, not elsewhere classified  Repeated falls  Rationale for Evaluation and Treatment: Rehabilitation  ONSET DATE: most recent 06/30/23  SUBJECTIVE:  SUBJECTIVE STATEMENT:   MRI posted last week, the nurse at the MD's office told me that I had a stroke and I'm still waiting to hear back from them. About a year and a half ago I had extreme dizziness and a couple of falls, woke up on the floor at one point and ended up with SDH and broken wrist at that time. Trying to get in to an appointment with MD to discuss this more     EVAL: I have fallen several times over the last year, have hit my head a few times, most recently I fell backwards trying to open the car door, and landed on back of my head, also I tried to climb a step and my L leg gave away and I fell on my  face. I do feel like I've been swimmy headed  since all of these falls started, about 2 years.  PERTINENT HISTORY: Frequent falls, has undergone knee replacements each knee, also R TSA,. Has arthritis in lower back.  PAIN:  Are you having pain? Yes: NPRS scale: 4/10 Pain location: R side of neck and shoulder Pain description: very stiff, aches everywhere  Aggravating factors: maybe rain and dampness/OA pains Relieving factors: ice  PRECAUTIONS: Fall  RED FLAGS: None   WEIGHT BEARING RESTRICTIONS: No  FALLS:  Has patient fallen in last 6 months? Yes several falls   LIVING ENVIRONMENT: Lives with: lives alone Lives in: House/apartment Stairs: No Has following equipment at home: Single point cane and Environmental consultant - 2 wheeled  OCCUPATION: retired  PLOF: Independent  PATIENT GOALS: avoid falls  NEXT MD VISIT: mid August   OBJECTIVE:  Note: Objective measures were completed at Evaluation unless otherwise noted.  DIAGNOSTIC FINDINGS: na    COGNITION: Overall cognitive status: Within functional limits for tasks assessed     SENSATION: WFL   POSTURE: mild B hip flexion, wider than normal base of support, rounded thoracic spine   PALPATION: na  LOWER EXTREMITY ROM: grossly wfl  LOWER EXTREMITY MMT: grossly 4/5 with burst testing throughout  08/25/23  Quads 4+/5, hams 4+/5, hip flexors 4+/5, hip ABD 3/5     FUNCTIONAL TESTS:  5 times sit to stand: 15.45; 08/25/23 14.3 seconds no UEs    BERG 34/54  FGA 10/30    08/25/23 0001  Berg Balance Test  Sit to Stand 4  Standing Unsupported 4  Sitting with Back Unsupported but Feet Supported on Floor or Stool 4  Stand to Sit 4  Transfers 4  Standing Unsupported with Eyes Closed 4  Standing Unsupported with Feet Together 3  From Standing, Reach Forward with Outstretched Arm 2  From Standing Position, Pick up Object from Floor 3  From Standing Position, Turn to Look Behind Over each Shoulder 3  Turn 360 Degrees 2   Standing Unsupported, Alternately Place Feet on Step/Stool 3  Standing Unsupported, One Foot in Front 1  Standing on One Leg 1  Total Score 42     GAIT: Distance walked: 64' in clinic Assistive device utilized: Single point cane Level of assistance: Modified independence Comments: very slow cadence, and gait speed, decreased stride length B  TREATMENT DATE:   08/25/23  Lots of education today on findings of old CVAs, what this means about f/u with MD, lifestyle factors that might cause a CVA, CVA prevention, BEFAST, difference between ischemic and hemorrhagic CVAs/interventions, more at risk of additional CVAs with hx of some   MMT, Berg, 5x STS and education on all findings/relevance to finding of chronic CVAs and POC/prognosis with PT   Nustep L5x8 minutes seat 8 BLEs only      08/17/23 NuStep L 5 x 6 min  Sit to stand x10, on airex  Slt 6 in box taps on airex x10 Side steps at mat  Side steps over T band HS curls 25lb 2x12 BERG 39/56  08/11/23 NuStep L 5 x 6 min  20lb resisted gait 4 way x 3 each HS curls 25lb 2x10 Leg Ext 10lb 2x10 Sit to stand holding yellow ball 2x10  08/02/23:  In ll bars:  Lunges on compliant surface of BOSU 10 x each leg Static standing semi tandem 20 sec holds  Heel/toe rocks on airex in ll bars 15x Knee ext 5# 2 x 15 Knee flexion 20 # 2 x 15 Standing at sink for heel/ toe raises, alt hip abd, alt hip ext, and high marches for home program, provided with written printed instructions Nustep level 5 x 6 min Ue's and LE's  07/29/23 NuStep L4 x 6 mn Alt 4in box taps 2x8 very unstable during RLE SLS phase  Side steps at mat table   Hs curls 20lb 2x10 Leg Ext 5lb 2x10 Backwards walking Forward marching  PATIENT EDUCATION:  Education details: POC, goals  Person educated: Patient Education method: Explanation,  Demonstration, Tactile cues, Verbal cues, and Handouts Education comprehension: verbalized understanding, returned demonstration, verbal cues required, tactile cues required, and needs further education  HOME EXERCISE PROGRAM: Access Code: [email or web address] URL: [email or web address] Date: 08/02/2023 Prepared by: Greig Speaks  Exercises - Heel Toe Raises with Counter Support  - 1 x daily - 7 x weekly - 3 sets - 10 reps - Standing Hip Abduction with Counter Support  - 1 x daily - 7 x weekly - 3 sets - 10 reps - Standing Hip Extension with Counter Support  - 1 x daily - 7 x weekly - 3 sets - 10 reps - Standing March with Counter Support  - 1 x daily - 7 x weekly - 3 sets - 10 reps TBD  ASSESSMENT:  CLINICAL IMPRESSION:  Saw her for progress note following findings of CVA- of note in imaging report, findings were old/not acute. She has not seen MD yet following these findings. Updated all measures given new information with no significant changes (and even improvement) in functional performance today. Lots of education given about CVA/post-CVA care as above. Most of her pain is now in R shoulder and neck. Will continue to progress as able/tolerated.   OBJECTIVE IMPAIRMENTS: decreased activity tolerance, decreased balance, decreased coordination, decreased endurance, difficulty walking, decreased strength, and postural dysfunction.   ACTIVITY LIMITATIONS: carrying, lifting, standing, squatting, transfers, locomotion level, and caring for others  PARTICIPATION LIMITATIONS: meal prep, cleaning, laundry, and community activity  PERSONAL FACTORS: Age, Behavior pattern, Fitness, Past/current experiences, Time since onset of injury/illness/exacerbation, and 1-2 comorbidities: B TKA, h/o frequent falls  are also affecting patient's functional outcome.   REHAB POTENTIAL: Good  CLINICAL DECISION MAKING: Evolving/moderate complexity  EVALUATION COMPLEXITY: Moderate   GOALS: Goals reviewed  with patient? Yes  SHORT TERM GOALS: Target date:  2 weeks, 08/09/23 I HEP  Baseline: Goal status: Met 08/11/23  LONG TERM GOALS: Target date: 12 weeks, 10/18/23  Improve Berg from 34/54 to 45/54 or greater  Baseline:  Goal status: Progressing 08/17/23  2.  Improve FGA from 10/30 to 20/30 or greater Baseline:  Goal status: INITIAL  3.  Improve activity tolerance/endurance, able to walk 800' or greater without fatigue, to improve community ambulation Baseline: TBD Goal status: INITIAL   PLAN:  PT FREQUENCY: 2x/week  PT DURATION: 12 weeks  PLANNED INTERVENTIONS: 97110-Therapeutic exercises, 97530- Therapeutic activity, 97112- Neuromuscular re-education, 97535- Self Care, and 02859- Manual therapy  PLAN FOR NEXT SESSION: balance and LE strength, careful with UE exercises (R shoulder and neck pain), functional activity tolerance   Josette Rough, PT, DPT 08/25/23 10:58 AM

## 2023-08-25 NOTE — Progress Notes (Signed)
   08/25/23 0001  Berg Balance Test  Sit to Stand 4  Standing Unsupported 4  Sitting with Back Unsupported but Feet Supported on Floor or Stool 4  Stand to Sit 4  Transfers 4  Standing Unsupported with Eyes Closed 4  Standing Unsupported with Feet Together 3  From Standing, Reach Forward with Outstretched Arm 2  From Standing Position, Pick up Object from Floor 3  From Standing Position, Turn to Look Behind Over each Shoulder 3  Turn 360 Degrees 2  Standing Unsupported, Alternately Place Feet on Step/Stool 3  Standing Unsupported, One Foot in Front 1  Standing on One Leg 1  Total Score 42

## 2023-08-30 ENCOUNTER — Encounter: Payer: Self-pay | Admitting: Physical Therapy

## 2023-08-30 ENCOUNTER — Ambulatory Visit: Admitting: Physical Therapy

## 2023-08-30 DIAGNOSIS — R296 Repeated falls: Secondary | ICD-10-CM

## 2023-08-30 DIAGNOSIS — R262 Difficulty in walking, not elsewhere classified: Secondary | ICD-10-CM | POA: Diagnosis not present

## 2023-08-30 NOTE — Therapy (Signed)
 OUTPATIENT PHYSICAL THERAPY LOWER EXTREMITY PROGRESS NOTE    Patient Name: Tasha Hunt MRN: [id number] DOB:[birth year], 78 y.o., female Today's Date: 08/30/2023   END OF SESSION:  PT End of Session - 08/30/23 1153     Visit Number 7    Date for PT Re-Evaluation 10/18/23    PT Start Time 1153    PT Stop Time 1230    PT Time Calculation (min) 37 min    Activity Tolerance Patient tolerated treatment well    Behavior During Therapy [facility] for tasks assessed/performed            Past Medical History:  Diagnosis Date   Allergic rhinitis, cause unspecified    Aneurysm of splenic artery (HCC)    noted since 2002   Anxiety    Arthritis    hands   Asthma    Complication of anesthesia    COPD (chronic obstructive pulmonary disease) (HCC)    Depressive disorder, not elsewhere classified    Dyspnea    Esophageal reflux    H/O hiatal hernia    Hypertension    Obesity, unspecified    Pneumonia    03/27/11 many many years ago   PONV (postoperative nausea and vomiting)    Ulcerative colitis (HCC)    Past Surgical History:  Procedure Laterality Date   ABDOMINAL HYSTERECTOMY  1980   ARTERIAL ANEURYSM REPAIR  03/27/2011   splenic artery   BREAST BIOPSY  2003   right   BREAST BIOPSY  2012   left   BREAST LUMPECTOMY WITH RADIOACTIVE SEED LOCALIZATION Right 07/06/2019   Procedure: RIGHT BREAST LUMPECTOMY WITH RADIOACTIVE SEED LOCALIZATION;  Surgeon: Vanderbilt Ned, MD;  Location: [facility];  Service: General;  Laterality: Right;   CATARACT EXTRACTION W/ INTRAOCULAR LENS IMPLANT Bilateral    CHOLECYSTECTOMY  2002   EMBOLIZATION N/A 03/27/2011   Procedure: EMBOLIZATION;  Surgeon: Carlin FORBES Haddock, MD;  Location: [facility] CATH LAB;  Service: Cardiovascular;  Laterality: N/A;   HERNIA REPAIR     KNEE CLOSED REDUCTION Left 10/24/2021   Procedure: CLOSED MANIPULATION KNEE;  Surgeon: Ernie Cough, MD;  Location: [facility];  Service: Orthopedics;  Laterality: Left;    LAPAROSCOPIC NISSEN FUNDOPLICATION  2002   NASAL SEPTUM SURGERY  1985   deviated septum   REVERSE SHOULDER ARTHROPLASTY Right 11/17/2019   Procedure: REVERSE SHOULDER ARTHROPLASTY;  Surgeon: Kay Kemps, MD;  Location: [facility];  Service: Orthopedics;  Laterality: Right;  interscalene block   TONSILLECTOMY  1948   TOTAL KNEE ARTHROPLASTY Right 04/20/2016   Procedure: RIGHT TOTAL KNEE ARTHROPLASTY;  Surgeon: Cough Ernie, MD;  Location: [facility];  Service: Orthopedics;  Laterality: Right;   TOTAL KNEE ARTHROPLASTY Left 06/19/2021   Patient Active Problem List   Diagnosis Date Noted   SDH (subdural hematoma) (HCC) 07/28/2022   S/P shoulder replacement, right 11/17/2019   S/P right TKA 04/20/2016   Aneurysm of splenic artery (HCC) 03/19/2011    PCP: Ainsley. Truman, MD  REFERRING PROVIDER: Jackson Mow, [place], [place]  REFERRING DIAG: h/o falls  THERAPY DIAG:  Difficulty in walking, not elsewhere classified  Repeated falls  Rationale for Evaluation and Treatment: Rehabilitation  ONSET DATE: most recent 06/30/23  SUBJECTIVE:   SUBJECTIVE STATEMENT:   OK, no pain    EVAL: I have fallen several times over the last year, have hit my head a few times, most recently I fell backwards trying to open the car door, and landed on back of my head, also I  tried to climb a step and my L leg gave away and I fell on my face. I do feel like I've been swimmy headed  since all of these falls started, about 2 years.  PERTINENT HISTORY: Frequent falls, has undergone knee replacements each knee, also R TSA,. Has arthritis in lower back.  PAIN:  Are you having pain? Yes: NPRS scale: 0/10 Pain location: R side of neck and shoulder Pain description: very stiff, aches everywhere  Aggravating factors: maybe rain and dampness/OA pains Relieving factors: ice  PRECAUTIONS: Fall  RED FLAGS: None   WEIGHT BEARING RESTRICTIONS: No  FALLS:  Has patient fallen in last 6 months? Yes several  falls   LIVING ENVIRONMENT: Lives with: lives alone Lives in: House/apartment Stairs: No Has following equipment at home: Single point cane and Environmental consultant - 2 wheeled  OCCUPATION: retired  PLOF: Independent  PATIENT GOALS: avoid falls  NEXT MD VISIT: mid August   OBJECTIVE:  Note: Objective measures were completed at Evaluation unless otherwise noted.  DIAGNOSTIC FINDINGS: na    COGNITION: Overall cognitive status: Within functional limits for tasks assessed     SENSATION: WFL   POSTURE: mild B hip flexion, wider than normal base of support, rounded thoracic spine   PALPATION: na  LOWER EXTREMITY ROM: grossly wfl  LOWER EXTREMITY MMT: grossly 4/5 with burst testing throughout  08/25/23  Quads 4+/5, hams 4+/5, hip flexors 4+/5, hip ABD 3/5     FUNCTIONAL TESTS:  5 times sit to stand: 15.45; 08/25/23 14.3 seconds no UEs    BERG 34/54  FGA 10/30    08/25/23 0001  Berg Balance Test  Sit to Stand 4  Standing Unsupported 4  Sitting with Back Unsupported but Feet Supported on Floor or Stool 4  Stand to Sit 4  Transfers 4  Standing Unsupported with Eyes Closed 4  Standing Unsupported with Feet Together 3  From Standing, Reach Forward with Outstretched Arm 2  From Standing Position, Pick up Object from Floor 3  From Standing Position, Turn to Look Behind Over each Shoulder 3  Turn 360 Degrees 2  Standing Unsupported, Alternately Place Feet on Step/Stool 3  Standing Unsupported, One Foot in Front 1  Standing on One Leg 1  Total Score 42     GAIT: Distance walked: 74' in clinic Assistive device utilized: Single point cane Level of assistance: Modified independence Comments: very slow cadence, and gait speed, decreased stride length B                                                                                                                                 TREATMENT DATE:  08/30/23 NuStep L 5 x 5 min  Gait around the front island in the back no  AD Sit to stand x10, on airex  Slt 6 in box taps on airex x10 Side steps on balance bean in //  Tandem walking in //  08/25/23  Lots of education today on findings of old CVAs, what this means about f/u with MD, lifestyle factors that might cause a CVA, CVA prevention, BEFAST, difference between ischemic and hemorrhagic CVAs/interventions, more at risk of additional CVAs with hx of some   MMT, Berg, 5x STS and education on all findings/relevance to finding of chronic CVAs and POC/prognosis with PT   Nustep L5x8 minutes seat 8 BLEs only    08/17/23 NuStep L 5 x 6 min  Sit to stand x10, on airex  Slt 6 in box taps on airex x10 Side steps at mat  Side steps over T band HS curls 25lb 2x12 BERG 39/56  08/11/23 NuStep L 5 x 6 min  20lb resisted gait 4 way x 3 each HS curls 25lb 2x10 Leg Ext 10lb 2x10 Sit to stand holding yellow ball 2x10  08/02/23:  In ll bars:  Lunges on compliant surface of BOSU 10 x each leg Static standing semi tandem 20 sec holds  Heel/toe rocks on airex in ll bars 15x Knee ext 5# 2 x 15 Knee flexion 20 # 2 x 15 Standing at sink for heel/ toe raises, alt hip abd, alt hip ext, and high marches for home program, provided with written printed instructions Nustep level 5 x 6 min Ue's and LE's  07/29/23 NuStep L4 x 6 mn Alt 4in box taps 2x8 very unstable during RLE SLS phase  Side steps at mat table   Hs curls 20lb 2x10 Leg Ext 5lb 2x10 Backwards walking Forward marching  PATIENT EDUCATION:  Education details: POC, goals  Person educated: Patient Education method: Explanation, Demonstration, Tactile cues, Verbal cues, and Handouts Education comprehension: verbalized understanding, returned demonstration, verbal cues required, tactile cues required, and needs further education  HOME EXERCISE PROGRAM: Access Code: [email or web address] URL: [email or web address] Date: 08/02/2023 Prepared by: Greig Speaks  Exercises - Heel Toe Raises with Counter  Support  - 1 x daily - 7 x weekly - 3 sets - 10 reps - Standing Hip Abduction with Counter Support  - 1 x daily - 7 x weekly - 3 sets - 10 reps - Standing Hip Extension with Counter Support  - 1 x daily - 7 x weekly - 3 sets - 10 reps - Standing March with Counter Support  - 1 x daily - 7 x weekly - 3 sets - 10 reps TBD  ASSESSMENT:  CLINICAL IMPRESSION:  8 min late for today's session with no pain. Reports that she was informed to just live her life form the MD and not to worry. Progressed to outdoor ambulation without issue only some velocity changes during uphill ambulation and as she fatigue. Some instability with alt box taps form airex. US  use needed with both interventions in // bars.   Will continue to progress as able/tolerated.   OBJECTIVE IMPAIRMENTS: decreased activity tolerance, decreased balance, decreased coordination, decreased endurance, difficulty walking, decreased strength, and postural dysfunction.   ACTIVITY LIMITATIONS: carrying, lifting, standing, squatting, transfers, locomotion level, and caring for others  PARTICIPATION LIMITATIONS: meal prep, cleaning, laundry, and community activity  PERSONAL FACTORS: Age, Behavior pattern, Fitness, Past/current experiences, Time since onset of injury/illness/exacerbation, and 1-2 comorbidities: B TKA, h/o frequent falls  are also affecting patient's functional outcome.   REHAB POTENTIAL: Good  CLINICAL DECISION MAKING: Evolving/moderate complexity  EVALUATION COMPLEXITY: Moderate   GOALS: Goals reviewed with patient? Yes  SHORT TERM GOALS: Target date:  2 weeks, 08/09/23 I HEP Baseline: Goal status: Met 08/11/23  LONG TERM GOALS: Target date:  12 weeks, 10/18/23  Improve Berg from 34/54 to 45/54 or greater  Baseline:  Goal status: Progressing 08/17/23  2.  Improve FGA from 10/30 to 20/30 or greater Baseline:  Goal status: INITIAL  3.  Improve activity tolerance/endurance, able to walk 800' or greater without  fatigue, to improve community ambulation Baseline: TBD Goal status: INITIAL   PLAN:  PT FREQUENCY: 2x/week  PT DURATION: 12 weeks  PLANNED INTERVENTIONS: 97110-Therapeutic exercises, 97530- Therapeutic activity, 97112- Neuromuscular re-education, 97535- Self Care, and [id number]- Manual therapy  PLAN FOR NEXT SESSION: balance and LE strength, careful with UE exercises (R shoulder and neck pain), functional activity tolerance   Tanda Sorrow, PTA 08/30/23 11:54 AM

## 2023-09-01 ENCOUNTER — Ambulatory Visit: Admitting: Physical Therapy

## 2023-09-01 ENCOUNTER — Encounter: Payer: Self-pay | Admitting: Physical Therapy

## 2023-09-01 DIAGNOSIS — R262 Difficulty in walking, not elsewhere classified: Secondary | ICD-10-CM | POA: Diagnosis not present

## 2023-09-01 DIAGNOSIS — R296 Repeated falls: Secondary | ICD-10-CM

## 2023-09-01 NOTE — Therapy (Signed)
 OUTPATIENT PHYSICAL THERAPY LOWER EXTREMITY PROGRESS NOTE    Patient Name: Tasha Hunt MRN: [id number] DOB:01/02/1946, 78 y.o., female Today's Date: 09/01/2023   END OF SESSION:  PT End of Session - 09/01/23 1148     Visit Number 8    Date for PT Re-Evaluation 10/18/23    PT Start Time 1145    PT Stop Time 1230    PT Time Calculation (min) 45 min    Activity Tolerance Patient tolerated treatment well    Behavior During Therapy [facility] for tasks assessed/performed            Past Medical History:  Diagnosis Date   Allergic rhinitis, cause unspecified    Aneurysm of splenic artery (HCC)    noted since 2002   Anxiety    Arthritis    hands   Asthma    Complication of anesthesia    COPD (chronic obstructive pulmonary disease) (HCC)    Depressive disorder, not elsewhere classified    Dyspnea    Esophageal reflux    H/O hiatal hernia    Hypertension    Obesity, unspecified    Pneumonia    03/27/11 many many years ago   PONV (postoperative nausea and vomiting)    Ulcerative colitis (HCC)    Past Surgical History:  Procedure Laterality Date   ABDOMINAL HYSTERECTOMY  1980   ARTERIAL ANEURYSM REPAIR  03/27/2011   splenic artery   BREAST BIOPSY  2003   right   BREAST BIOPSY  2012   left   BREAST LUMPECTOMY WITH RADIOACTIVE SEED LOCALIZATION Right 07/06/2019   Procedure: RIGHT BREAST LUMPECTOMY WITH RADIOACTIVE SEED LOCALIZATION;  Surgeon: Vanderbilt Ned, MD;  Location: [facility];  Service: General;  Laterality: Right;   CATARACT EXTRACTION W/ INTRAOCULAR LENS IMPLANT Bilateral    CHOLECYSTECTOMY  2002   EMBOLIZATION N/A 03/27/2011   Procedure: EMBOLIZATION;  Surgeon: Carlin FORBES Haddock, MD;  Location: [facility] CATH LAB;  Service: Cardiovascular;  Laterality: N/A;   HERNIA REPAIR     KNEE CLOSED REDUCTION Left 10/24/2021   Procedure: CLOSED MANIPULATION KNEE;  Surgeon: Ernie Cough, MD;  Location: [facility];  Service: Orthopedics;  Laterality: Left;    LAPAROSCOPIC NISSEN FUNDOPLICATION  2002   NASAL SEPTUM SURGERY  1985   deviated septum   REVERSE SHOULDER ARTHROPLASTY Right 11/17/2019   Procedure: REVERSE SHOULDER ARTHROPLASTY;  Surgeon: Kay Kemps, MD;  Location: [facility];  Service: Orthopedics;  Laterality: Right;  interscalene block   TONSILLECTOMY  1948   TOTAL KNEE ARTHROPLASTY Right 04/20/2016   Procedure: RIGHT TOTAL KNEE ARTHROPLASTY;  Surgeon: Cough Ernie, MD;  Location: [facility];  Service: Orthopedics;  Laterality: Right;   TOTAL KNEE ARTHROPLASTY Left 06/19/2021   Patient Active Problem List   Diagnosis Date Noted   SDH (subdural hematoma) (HCC) 07/28/2022   S/P shoulder replacement, right 11/17/2019   S/P right TKA 04/20/2016   Aneurysm of splenic artery (HCC) 03/19/2011    PCP: Ainsley. Truman, MD  REFERRING PROVIDER: Jackson Mow, [place], [place]  REFERRING DIAG: h/o falls  THERAPY DIAG:  Difficulty in walking, not elsewhere classified  Repeated falls  Rationale for Evaluation and Treatment: Rehabilitation  ONSET DATE: most recent 06/30/23  SUBJECTIVE:   SUBJECTIVE STATEMENT:   Fine    EVAL: I have fallen several times over the last year, have hit my head a few times, most recently I fell backwards trying to open the car door, and landed on back of my head, also I tried to  climb a step and my L leg gave away and I fell on my face. I do feel like I've been swimmy headed  since all of these falls started, about 2 years.  PERTINENT HISTORY: Frequent falls, has undergone knee replacements each knee, also R TSA,. Has arthritis in lower back.  PAIN:  Are you having pain? Yes: NPRS scale: 0/10 Pain location: R side of neck and shoulder Pain description: very stiff, aches everywhere  Aggravating factors: maybe rain and dampness/OA pains Relieving factors: ice  PRECAUTIONS: Fall  RED FLAGS: None   WEIGHT BEARING RESTRICTIONS: No  FALLS:  Has patient fallen in last 6 months? Yes several falls    LIVING ENVIRONMENT: Lives with: lives alone Lives in: House/apartment Stairs: No Has following equipment at home: Single point cane and Environmental consultant - 2 wheeled  OCCUPATION: retired  PLOF: Independent  PATIENT GOALS: avoid falls  NEXT MD VISIT: mid August   OBJECTIVE:  Note: Objective measures were completed at Evaluation unless otherwise noted.  DIAGNOSTIC FINDINGS: na    COGNITION: Overall cognitive status: Within functional limits for tasks assessed     SENSATION: WFL   POSTURE: mild B hip flexion, wider than normal base of support, rounded thoracic spine   PALPATION: na  LOWER EXTREMITY ROM: grossly wfl  LOWER EXTREMITY MMT: grossly 4/5 with burst testing throughout  08/25/23  Quads 4+/5, hams 4+/5, hip flexors 4+/5, hip ABD 3/5     FUNCTIONAL TESTS:  5 times sit to stand: 15.45; 08/25/23 14.3 seconds no UEs    BERG 34/54  FGA 10/30    08/25/23 0001  Berg Balance Test  Sit to Stand 4  Standing Unsupported 4  Sitting with Back Unsupported but Feet Supported on Floor or Stool 4  Stand to Sit 4  Transfers 4  Standing Unsupported with Eyes Closed 4  Standing Unsupported with Feet Together 3  From Standing, Reach Forward with Outstretched Arm 2  From Standing Position, Pick up Object from Floor 3  From Standing Position, Turn to Look Behind Over each Shoulder 3  Turn 360 Degrees 2  Standing Unsupported, Alternately Place Feet on Step/Stool 3  Standing Unsupported, One Foot in Front 1  Standing on One Leg 1  Total Score 42     GAIT: Distance walked: 31' in clinic Assistive device utilized: Single point cane Level of assistance: Modified independence Comments: very slow cadence, and gait speed, decreased stride length B                                                                                                                                 TREATMENT DATE:  09/01/23 NuStep L5 x 6 min Goals   Berg 47/56 Resisted gait 30lb 4 way x 3  each 4in box on airex step ups x10 each   08/30/23 NuStep L 5 x 5 min  Gait around the front island in the back no AD Sit to stand  x10, on airex  Slt 6 in box taps on airex x10 Side steps on balance bean in //  Tandem walking in //  08/25/23  Lots of education today on findings of old CVAs, what this means about f/u with MD, lifestyle factors that might cause a CVA, CVA prevention, BEFAST, difference between ischemic and hemorrhagic CVAs/interventions, more at risk of additional CVAs with hx of some   MMT, Berg, 5x STS and education on all findings/relevance to finding of chronic CVAs and POC/prognosis with PT   Nustep L5x8 minutes seat 8 BLEs only    08/17/23 NuStep L 5 x 6 min  Sit to stand x10, on airex  Slt 6 in box taps on airex x10 Side steps at mat  Side steps over T band HS curls 25lb 2x12 BERG 39/56  08/11/23 NuStep L 5 x 6 min  20lb resisted gait 4 way x 3 each HS curls 25lb 2x10 Leg Ext 10lb 2x10 Sit to stand holding yellow ball 2x10  08/02/23:  In ll bars:  Lunges on compliant surface of BOSU 10 x each leg Static standing semi tandem 20 sec holds  Heel/toe rocks on airex in ll bars 15x Knee ext 5# 2 x 15 Knee flexion 20 # 2 x 15 Standing at sink for heel/ toe raises, alt hip abd, alt hip ext, and high marches for home program, provided with written printed instructions Nustep level 5 x 6 min Ue's and LE's  07/29/23 NuStep L4 x 6 mn Alt 4in box taps 2x8 very unstable during RLE SLS phase  Side steps at mat table   Hs curls 20lb 2x10 Leg Ext 5lb 2x10 Backwards walking Forward marching  PATIENT EDUCATION:  Education details: POC, goals  Person educated: Patient Education method: Explanation, Demonstration, Tactile cues, Verbal cues, and Handouts Education comprehension: verbalized understanding, returned demonstration, verbal cues required, tactile cues required, and needs further education  HOME EXERCISE PROGRAM: Access Code: [email or web address] URL:  [email or web address] Date: 08/02/2023 Prepared by: Greig Speaks  Exercises - Heel Toe Raises with Counter Support  - 1 x daily - 7 x weekly - 3 sets - 10 reps - Standing Hip Abduction with Counter Support  - 1 x daily - 7 x weekly - 3 sets - 10 reps - Standing Hip Extension with Counter Support  - 1 x daily - 7 x weekly - 3 sets - 10 reps - Standing March with Counter Support  - 1 x daily - 7 x weekly - 3 sets - 10 reps TBD  ASSESSMENT:  CLINICAL IMPRESSION:  Pt enters doing well. Pt has progressed meeting BERG goal. Increase fatigue today with BERG balance test and resisted gait.  Most difficulty ith alt box taps and SLS. Some LOB with step ups needing assist to correct.  Will continue to progress as able/tolerated.   OBJECTIVE IMPAIRMENTS: decreased activity tolerance, decreased balance, decreased coordination, decreased endurance, difficulty walking, decreased strength, and postural dysfunction.   ACTIVITY LIMITATIONS: carrying, lifting, standing, squatting, transfers, locomotion level, and caring for others  PARTICIPATION LIMITATIONS: meal prep, cleaning, laundry, and community activity  PERSONAL FACTORS: Age, Behavior pattern, Fitness, Past/current experiences, Time since onset of injury/illness/exacerbation, and 1-2 comorbidities: B TKA, h/o frequent falls  are also affecting patient's functional outcome.   REHAB POTENTIAL: Good  CLINICAL DECISION MAKING: Evolving/moderate complexity  EVALUATION COMPLEXITY: Moderate   GOALS: Goals reviewed with patient? Yes  SHORT TERM GOALS: Target date:  2 weeks, 08/09/23 I HEP Baseline: Goal status: Met 08/11/23  LONG  TERM GOALS: Target date: 12 weeks, 10/18/23  Improve Berg from 34/54 to 45/54 or greater  Baseline:  Goal status: Progressing 08/17/23, Met 09/01/23 47/56  2.  Improve FGA from 10/30 to 20/30 or greater Baseline:  Goal status: INITIAL  3.  Improve activity tolerance/endurance, able to walk 800' or  greater without fatigue, to improve community ambulation Baseline: TBD Goal status: Progressing 08/30/23   PLAN:  PT FREQUENCY: 2x/week  PT DURATION: 12 weeks  PLANNED INTERVENTIONS: 97110-Therapeutic exercises, 97530- Therapeutic activity, 97112- Neuromuscular re-education, 97535- Self Care, and 02859- Manual therapy  PLAN FOR NEXT SESSION: balance and LE strength, careful with UE exercises (R shoulder and neck pain), functional activity tolerance   Tanda Sorrow, PTA 09/01/23 11:48 AM

## 2023-09-06 ENCOUNTER — Ambulatory Visit: Admitting: Physical Therapy

## 2023-09-06 ENCOUNTER — Encounter: Payer: Self-pay | Admitting: Physical Therapy

## 2023-09-06 DIAGNOSIS — R296 Repeated falls: Secondary | ICD-10-CM

## 2023-09-06 DIAGNOSIS — R262 Difficulty in walking, not elsewhere classified: Secondary | ICD-10-CM

## 2023-09-06 NOTE — Therapy (Signed)
 OUTPATIENT PHYSICAL THERAPY LOWER EXTREMITY PROGRESS NOTE    Patient Name: Tasha Hunt MRN: [id number] DOB:06-06-1945, 78 y.o., female Today's Date: 09/06/2023   END OF SESSION:  PT End of Session - 09/06/23 1142     Visit Number 9    Date for PT Re-Evaluation 10/18/23    PT Start Time 1145    PT Stop Time 1230    PT Time Calculation (min) 45 min    Activity Tolerance Patient tolerated treatment well    Behavior During Therapy [facility] for tasks assessed/performed            Past Medical History:  Diagnosis Date   Allergic rhinitis, cause unspecified    Aneurysm of splenic artery (HCC)    noted since 2002   Anxiety    Arthritis    hands   Asthma    Complication of anesthesia    COPD (chronic obstructive pulmonary disease) (HCC)    Depressive disorder, not elsewhere classified    Dyspnea    Esophageal reflux    H/O hiatal hernia    Hypertension    Obesity, unspecified    Pneumonia    03/27/11 many many years ago   PONV (postoperative nausea and vomiting)    Ulcerative colitis (HCC)    Past Surgical History:  Procedure Laterality Date   ABDOMINAL HYSTERECTOMY  1980   ARTERIAL ANEURYSM REPAIR  03/27/2011   splenic artery   BREAST BIOPSY  2003   right   BREAST BIOPSY  2012   left   BREAST LUMPECTOMY WITH RADIOACTIVE SEED LOCALIZATION Right 07/06/2019   Procedure: RIGHT BREAST LUMPECTOMY WITH RADIOACTIVE SEED LOCALIZATION;  Surgeon: Vanderbilt Ned, MD;  Location: [facility];  Service: General;  Laterality: Right;   CATARACT EXTRACTION W/ INTRAOCULAR LENS IMPLANT Bilateral    CHOLECYSTECTOMY  2002   EMBOLIZATION N/A 03/27/2011   Procedure: EMBOLIZATION;  Surgeon: Carlin FORBES Haddock, MD;  Location: [facility] CATH LAB;  Service: Cardiovascular;  Laterality: N/A;   HERNIA REPAIR     KNEE CLOSED REDUCTION Left 10/24/2021   Procedure: CLOSED MANIPULATION KNEE;  Surgeon: Ernie Cough, MD;  Location: [facility];  Service: Orthopedics;  Laterality: Left;    LAPAROSCOPIC NISSEN FUNDOPLICATION  2002   NASAL SEPTUM SURGERY  1985   deviated septum   REVERSE SHOULDER ARTHROPLASTY Right 11/17/2019   Procedure: REVERSE SHOULDER ARTHROPLASTY;  Surgeon: Kay Kemps, MD;  Location: [facility];  Service: Orthopedics;  Laterality: Right;  interscalene block   TONSILLECTOMY  1948   TOTAL KNEE ARTHROPLASTY Right 04/20/2016   Procedure: RIGHT TOTAL KNEE ARTHROPLASTY;  Surgeon: Cough Ernie, MD;  Location: [facility];  Service: Orthopedics;  Laterality: Right;   TOTAL KNEE ARTHROPLASTY Left 06/19/2021   Patient Active Problem List   Diagnosis Date Noted   SDH (subdural hematoma) (HCC) 07/28/2022   S/P shoulder replacement, right 11/17/2019   S/P right TKA 04/20/2016   Aneurysm of splenic artery (HCC) 03/19/2011    PCP: Ainsley. Truman, MD  REFERRING PROVIDER: Jackson Mow, [place], [place]  REFERRING DIAG: h/o falls  THERAPY DIAG:  Difficulty in walking, not elsewhere classified  Repeated falls  Rationale for Evaluation and Treatment: Rehabilitation  ONSET DATE: most recent 06/30/23  SUBJECTIVE:   SUBJECTIVE STATEMENT:   Fine, except for the shoulder R shoulder pain    EVAL: I have fallen several times over the last year, have hit my head a few times, most recently I fell backwards trying to open the car door, and landed on back  of my head, also I tried to climb a step and my L leg gave away and I fell on my face. I do feel like I've been swimmy headed  since all of these falls started, about 2 years.  PERTINENT HISTORY: Frequent falls, has undergone knee replacements each knee, also R TSA,. Has arthritis in lower back.  PAIN:  Are you having pain? Yes: NPRS scale: 4/10 Pain location: R  shoulder Pain description: very stiff, aches everywhere  Aggravating factors: maybe rain and dampness/OA pains Relieving factors: ice  PRECAUTIONS: Fall  RED FLAGS: None   WEIGHT BEARING RESTRICTIONS: No  FALLS:  Has patient fallen in last 6  months? Yes several falls   LIVING ENVIRONMENT: Lives with: lives alone Lives in: House/apartment Stairs: No Has following equipment at home: Single point cane and Environmental consultant - 2 wheeled  OCCUPATION: retired  PLOF: Independent  PATIENT GOALS: avoid falls  NEXT MD VISIT: mid August   OBJECTIVE:  Note: Objective measures were completed at Evaluation unless otherwise noted.  DIAGNOSTIC FINDINGS: na    COGNITION: Overall cognitive status: Within functional limits for tasks assessed     SENSATION: WFL   POSTURE: mild B hip flexion, wider than normal base of support, rounded thoracic spine   PALPATION: na  LOWER EXTREMITY ROM: grossly wfl  LOWER EXTREMITY MMT: grossly 4/5 with burst testing throughout  08/25/23  Quads 4+/5, hams 4+/5, hip flexors 4+/5, hip ABD 3/5     FUNCTIONAL TESTS:  5 times sit to stand: 15.45; 08/25/23 14.3 seconds no UEs    BERG 34/54  FGA 10/30    08/25/23 0001  Berg Balance Test  Sit to Stand 4  Standing Unsupported 4  Sitting with Back Unsupported but Feet Supported on Floor or Stool 4  Stand to Sit 4  Transfers 4  Standing Unsupported with Eyes Closed 4  Standing Unsupported with Feet Together 3  From Standing, Reach Forward with Outstretched Arm 2  From Standing Position, Pick up Object from Floor 3  From Standing Position, Turn to Look Behind Over each Shoulder 3  Turn 360 Degrees 2  Standing Unsupported, Alternately Place Feet on Step/Stool 3  Standing Unsupported, One Foot in Front 1  Standing on One Leg 1  Total Score 42     GAIT: Distance walked: 17' in clinic Assistive device utilized: Single point cane Level of assistance: Modified independence Comments: very slow cadence, and gait speed, decreased stride length B                                                                                                                                 TREATMENT DATE:  09/06/23 NuStep L5 x 6 min 6in step ups x10 4in lateral  step ups x10  Sit to stand on airex 2x10  On aires standing ball toss  On airex alt Cone taps  HS curls 25lb 2x10 Leg Ext 10lb 2x10  09/01/23 NuStep  L5 x 6 min Goals   Berg 47/56 Resisted gait 30lb 4 way x 3 each 4in box on airex step ups x10 each   08/30/23 NuStep L 5 x 5 min  Gait around the front island in the back no AD Sit to stand x10, on airex  Slt 6 in box taps on airex x10 Side steps on balance bean in //  Tandem walking in //  08/25/23  Lots of education today on findings of old CVAs, what this means about f/u with MD, lifestyle factors that might cause a CVA, CVA prevention, BEFAST, difference between ischemic and hemorrhagic CVAs/interventions, more at risk of additional CVAs with hx of some   MMT, Berg, 5x STS and education on all findings/relevance to finding of chronic CVAs and POC/prognosis with PT   Nustep L5x8 minutes seat 8 BLEs only    08/17/23 NuStep L 5 x 6 min  Sit to stand x10, on airex  Slt 6 in box taps on airex x10 Side steps at mat  Side steps over T band HS curls 25lb 2x12 BERG 39/56  08/11/23 NuStep L 5 x 6 min  20lb resisted gait 4 way x 3 each HS curls 25lb 2x10 Leg Ext 10lb 2x10 Sit to stand holding yellow ball 2x10  08/02/23:  In ll bars:  Lunges on compliant surface of BOSU 10 x each leg Static standing semi tandem 20 sec holds  Heel/toe rocks on airex in ll bars 15x Knee ext 5# 2 x 15 Knee flexion 20 # 2 x 15 Standing at sink for heel/ toe raises, alt hip abd, alt hip ext, and high marches for home program, provided with written printed instructions Nustep level 5 x 6 min Ue's and LE's  07/29/23 NuStep L4 x 6 mn Alt 4in box taps 2x8 very unstable during RLE SLS phase  Side steps at mat table   Hs curls 20lb 2x10 Leg Ext 5lb 2x10 Backwards walking Forward marching  PATIENT EDUCATION:  Education details: POC, goals  Person educated: Patient Education method: Explanation, Demonstration, Tactile cues, Verbal cues, and  Handouts Education comprehension: verbalized understanding, returned demonstration, verbal cues required, tactile cues required, and needs further education  HOME EXERCISE PROGRAM: Access Code: [email or web address] URL: [email or web address] Date: 08/02/2023 Prepared by: Greig Speaks  Exercises - Heel Toe Raises with Counter Support  - 1 x daily - 7 x weekly - 3 sets - 10 reps - Standing Hip Abduction with Counter Support  - 1 x daily - 7 x weekly - 3 sets - 10 reps - Standing Hip Extension with Counter Support  - 1 x daily - 7 x weekly - 3 sets - 10 reps - Standing March with Counter Support  - 1 x daily - 7 x weekly - 3 sets - 10 reps TBD  ASSESSMENT:  CLINICAL IMPRESSION:  Pt enters doing well. Pt fatigue quick with functional interventions. Most difficulty with alt cone taps form airex requiring CGA. Sit to stands were completed from elevated surface. Will continue to progress as able/tolerated.   OBJECTIVE IMPAIRMENTS: decreased activity tolerance, decreased balance, decreased coordination, decreased endurance, difficulty walking, decreased strength, and postural dysfunction.   ACTIVITY LIMITATIONS: carrying, lifting, standing, squatting, transfers, locomotion level, and caring for others  PARTICIPATION LIMITATIONS: meal prep, cleaning, laundry, and community activity  PERSONAL FACTORS: Age, Behavior pattern, Fitness, Past/current experiences, Time since onset of injury/illness/exacerbation, and 1-2 comorbidities: B TKA, h/o frequent falls  are also affecting patient's functional outcome.   REHAB POTENTIAL: Good  CLINICAL DECISION MAKING: Evolving/moderate complexity  EVALUATION COMPLEXITY: Moderate   GOALS: Goals reviewed with patient? Yes  SHORT TERM GOALS: Target date:  2 weeks, 08/09/23 I HEP Baseline: Goal status: Met 08/11/23  LONG TERM GOALS: Target date: 12 weeks, 10/18/23  Improve Berg from 34/54 to 45/54 or greater  Baseline:  Goal status: Progressing  08/17/23, Met 09/01/23 47/56  2.  Improve FGA from 10/30 to 20/30 or greater Baseline:  Goal status: INITIAL  3.  Improve activity tolerance/endurance, able to walk 800' or greater without fatigue, to improve community ambulation Baseline: TBD Goal status: Progressing 08/30/23   PLAN:  PT FREQUENCY: 2x/week  PT DURATION: 12 weeks  PLANNED INTERVENTIONS: 97110-Therapeutic exercises, 97530- Therapeutic activity, 97112- Neuromuscular re-education, 97535- Self Care, and [id number]- Manual therapy  PLAN FOR NEXT SESSION: balance and LE strength, careful with UE exercises (R shoulder and neck pain), functional activity tolerance   Tanda Sorrow, PTA 09/06/23 11:42 AM

## 2023-09-08 ENCOUNTER — Encounter: Payer: Self-pay | Admitting: Physical Therapy

## 2023-09-08 ENCOUNTER — Ambulatory Visit: Admitting: Physical Therapy

## 2023-09-08 DIAGNOSIS — R262 Difficulty in walking, not elsewhere classified: Secondary | ICD-10-CM

## 2023-09-08 DIAGNOSIS — R296 Repeated falls: Secondary | ICD-10-CM

## 2023-09-08 NOTE — Therapy (Addendum)
 OUTPATIENT PHYSICAL THERAPY LOWER EXTREMITY TREATMENT Progress Note Reporting Period 07/26/23 to 09/08/23  See note below for Objective Data and Assessment of Progress/Goals.      Patient Name: Tasha Hunt MRN: [id number] DOB:1945/02/25, 78 y.o., female Today's Date: 09/08/2023   END OF SESSION:  PT End of Session - 09/08/23 1147     Visit Number 10    Date for PT Re-Evaluation 10/18/23    PT Start Time 1145    PT Stop Time 1230    PT Time Calculation (min) 45 min    Activity Tolerance Patient tolerated treatment well    Behavior During Therapy [facility] for tasks assessed/performed            Past Medical History:  Diagnosis Date   Allergic rhinitis, cause unspecified    Aneurysm of splenic artery (HCC)    noted since 2002   Anxiety    Arthritis    hands   Asthma    Complication of anesthesia    COPD (chronic obstructive pulmonary disease) (HCC)    Depressive disorder, not elsewhere classified    Dyspnea    Esophageal reflux    H/O hiatal hernia    Hypertension    Obesity, unspecified    Pneumonia    03/27/11 many many years ago   PONV (postoperative nausea and vomiting)    Ulcerative colitis (HCC)    Past Surgical History:  Procedure Laterality Date   ABDOMINAL HYSTERECTOMY  1980   ARTERIAL ANEURYSM REPAIR  03/27/2011   splenic artery   BREAST BIOPSY  2003   right   BREAST BIOPSY  2012   left   BREAST LUMPECTOMY WITH RADIOACTIVE SEED LOCALIZATION Right 07/06/2019   Procedure: RIGHT BREAST LUMPECTOMY WITH RADIOACTIVE SEED LOCALIZATION;  Surgeon: Vanderbilt Ned, MD;  Location: [facility];  Service: General;  Laterality: Right;   CATARACT EXTRACTION W/ INTRAOCULAR LENS IMPLANT Bilateral    CHOLECYSTECTOMY  2002   EMBOLIZATION N/A 03/27/2011   Procedure: EMBOLIZATION;  Surgeon: Carlin FORBES Haddock, MD;  Location: [facility] CATH LAB;  Service: Cardiovascular;  Laterality: N/A;   HERNIA REPAIR     KNEE CLOSED REDUCTION Left 10/24/2021    Procedure: CLOSED MANIPULATION KNEE;  Surgeon: Ernie Cough, MD;  Location: [facility];  Service: Orthopedics;  Laterality: Left;   LAPAROSCOPIC NISSEN FUNDOPLICATION  2002   NASAL SEPTUM SURGERY  1985   deviated septum   REVERSE SHOULDER ARTHROPLASTY Right 11/17/2019   Procedure: REVERSE SHOULDER ARTHROPLASTY;  Surgeon: Kay Kemps, MD;  Location: [facility];  Service: Orthopedics;  Laterality: Right;  interscalene block   TONSILLECTOMY  1948   TOTAL KNEE ARTHROPLASTY Right 04/20/2016   Procedure: RIGHT TOTAL KNEE ARTHROPLASTY;  Surgeon: Cough Ernie, MD;  Location: [facility];  Service: Orthopedics;  Laterality: Right;   TOTAL KNEE ARTHROPLASTY Left 06/19/2021   Patient Active Problem List   Diagnosis Date Noted   SDH (subdural hematoma) (HCC) 07/28/2022   S/P shoulder replacement, right 11/17/2019   S/P right TKA 04/20/2016   Aneurysm of splenic artery (HCC) 03/19/2011    PCP: Ainsley. Truman, MD  REFERRING PROVIDER: Jackson Mow, [place], [place]  REFERRING DIAG: h/o falls  THERAPY DIAG:  Difficulty in walking, not elsewhere classified  Repeated falls  Rationale for Evaluation and Treatment: Rehabilitation  ONSET DATE: most recent 06/30/23  SUBJECTIVE:   SUBJECTIVE STATEMENT:   Fine, except for the shoulder R shoulder pain    EVAL: I have fallen several times over the last year, have hit my  head a few times, most recently I fell backwards trying to open the car door, and landed on back of my head, also I tried to climb a step and my L leg gave away and I fell on my face. I do feel like I've been swimmy headed  since all of these falls started, about 2 years.  PERTINENT HISTORY: Frequent falls, has undergone knee replacements each knee, also R TSA,. Has arthritis in lower back.  PAIN:  Are you having pain? Yes: NPRS scale: 4/10 Pain location: R  shoulder Pain description: very stiff, aches everywhere  Aggravating factors: maybe rain and dampness/OA pains Relieving  factors: ice  PRECAUTIONS: Fall  RED FLAGS: None   WEIGHT BEARING RESTRICTIONS: No  FALLS:  Has patient fallen in last 6 months? Yes several falls   LIVING ENVIRONMENT: Lives with: lives alone Lives in: House/apartment Stairs: No Has following equipment at home: Single point cane and Environmental consultant - 2 wheeled  OCCUPATION: retired  PLOF: Independent  PATIENT GOALS: avoid falls  NEXT MD VISIT: mid August   OBJECTIVE:  Note: Objective measures were completed at Evaluation unless otherwise noted.  DIAGNOSTIC FINDINGS: na    COGNITION: Overall cognitive status: Within functional limits for tasks assessed     SENSATION: WFL   POSTURE: mild B hip flexion, wider than normal base of support, rounded thoracic spine   PALPATION: na  LOWER EXTREMITY ROM: grossly wfl  LOWER EXTREMITY MMT: grossly 4/5 with burst testing throughout  08/25/23  Quads 4+/5, hams 4+/5, hip flexors 4+/5, hip ABD 3/5     FUNCTIONAL TESTS:  5 times sit to stand: 15.45; 08/25/23 14.3 seconds no UEs    BERG 34/54  FGA 10/30    08/25/23 0001  Berg Balance Test  Sit to Stand 4  Standing Unsupported 4  Sitting with Back Unsupported but Feet Supported on Floor or Stool 4  Stand to Sit 4  Transfers 4  Standing Unsupported with Eyes Closed 4  Standing Unsupported with Feet Together 3  From Standing, Reach Forward with Outstretched Arm 2  From Standing Position, Pick up Object from Floor 3  From Standing Position, Turn to Look Behind Over each Shoulder 3  Turn 360 Degrees 2  Standing Unsupported, Alternately Place Feet on Step/Stool 3  Standing Unsupported, One Foot in Front 1  Standing on One Leg 1  Total Score 42     GAIT: Distance walked: 55' in clinic Assistive device utilized: Single point cane Level of assistance: Modified independence Comments: very slow cadence, and gait speed, decreased stride length B                                                                                                                                  TREATMENT DATE:  09/08/23 NuStep L5 x 6 min Goals   FGA 19  Gait outside around the two L side islands and the front island. Very fatigue requiring  one standing rest break.    09/06/23 NuStep L5 x 6 min 6in step ups x10 4in lateral step ups x10  Sit to stand on airex 2x10  On aires standing ball toss  On airex alt Cone taps  HS curls 25lb 2x10 Leg Ext 10lb 2x10  09/01/23 NuStep L5 x 6 min Goals   Berg 47/56 Resisted gait 30lb 4 way x 3 each 4in box on airex step ups x10 each   08/30/23 NuStep L 5 x 5 min  Gait around the front island in the back no AD Sit to stand x10, on airex  Slt 6 in box taps on airex x10 Side steps on balance bean in //  Tandem walking in //  08/25/23  Lots of education today on findings of old CVAs, what this means about f/u with MD, lifestyle factors that might cause a CVA, CVA prevention, BEFAST, difference between ischemic and hemorrhagic CVAs/interventions, more at risk of additional CVAs with hx of some   MMT, Berg, 5x STS and education on all findings/relevance to finding of chronic CVAs and POC/prognosis with PT   Nustep L5x8 minutes seat 8 BLEs only    08/17/23 NuStep L 5 x 6 min  Sit to stand x10, on airex  Slt 6 in box taps on airex x10 Side steps at mat  Side steps over T band HS curls 25lb 2x12 BERG 39/56  08/11/23 NuStep L 5 x 6 min  20lb resisted gait 4 way x 3 each HS curls 25lb 2x10 Leg Ext 10lb 2x10 Sit to stand holding yellow ball 2x10  08/02/23:  In ll bars:  Lunges on compliant surface of BOSU 10 x each leg Static standing semi tandem 20 sec holds  Heel/toe rocks on airex in ll bars 15x Knee ext 5# 2 x 15 Knee flexion 20 # 2 x 15 Standing at sink for heel/ toe raises, alt hip abd, alt hip ext, and high marches for home program, provided with written printed instructions Nustep level 5 x 6 min Ue's and LE's  07/29/23 NuStep L4 x 6 mn Alt 4in box taps 2x8 very  unstable during RLE SLS phase  Side steps at mat table   Hs curls 20lb 2x10 Leg Ext 5lb 2x10 Backwards walking Forward marching  PATIENT EDUCATION:  Education details: POC, goals  Person educated: Patient Education method: Explanation, Demonstration, Tactile cues, Verbal cues, and Handouts Education comprehension: verbalized understanding, returned demonstration, verbal cues required, tactile cues required, and needs further education  HOME EXERCISE PROGRAM: Access Code: [email or web address] URL: [email or web address] Date: 08/02/2023 Prepared by: Greig Speaks  Exercises - Heel Toe Raises with Counter Support  - 1 x daily - 7 x weekly - 3 sets - 10 reps - Standing Hip Abduction with Counter Support  - 1 x daily - 7 x weekly - 3 sets - 10 reps - Standing Hip Extension with Counter Support  - 1 x daily - 7 x weekly - 3 sets - 10 reps - Standing March with Counter Support  - 1 x daily - 7 x weekly - 3 sets - 10 reps TBD  ASSESSMENT:  CLINICAL IMPRESSION:  Pt enters doing well. 10th visit progress note. She has progressed increasing her FGA score. Increase fatigue with outdoor ambulation. No repots of pain during session. Will continue to progress as able/tolerated.   OBJECTIVE IMPAIRMENTS: decreased activity tolerance, decreased balance, decreased coordination, decreased endurance, difficulty walking, decreased strength, and postural dysfunction.   ACTIVITY LIMITATIONS: carrying, lifting, standing, squatting,  transfers, locomotion level, and caring for others  PARTICIPATION LIMITATIONS: meal prep, cleaning, laundry, and community activity  PERSONAL FACTORS: Age, Behavior pattern, Fitness, Past/current experiences, Time since onset of injury/illness/exacerbation, and 1-2 comorbidities: B TKA, h/o frequent falls  are also affecting patient's functional outcome.   REHAB POTENTIAL: Good  CLINICAL DECISION MAKING: Evolving/moderate complexity  EVALUATION COMPLEXITY:  Moderate   GOALS: Goals reviewed with patient? Yes  SHORT TERM GOALS: Target date:  2 weeks, 08/09/23 I HEP Baseline: Goal status: Met 08/11/23  LONG TERM GOALS: Target date: 12 weeks, 10/18/23  Improve Berg from 34/54 to 45/54 or greater  Baseline:  Goal status: Progressing 08/17/23, Met 09/01/23 47/56  2.  Improve FGA from 10/30 to 20/30 or greater Baseline:  Goal status: Progressing 19/30 09/08/23  3.  Improve activity tolerance/endurance, able to walk 800' or greater without fatigue, to improve community ambulation Baseline: TBD Goal status: Progressing 09/08/23   PLAN:  PT FREQUENCY: 2x/week  PT DURATION: 12 weeks  PLANNED INTERVENTIONS: 97110-Therapeutic exercises, 97530- Therapeutic activity, 97112- Neuromuscular re-education, 97535- Self Care, and [id number]- Manual therapy  PLAN FOR NEXT SESSION: balance and LE strength, careful with UE exercises (R shoulder and neck pain), functional activity tolerance   Tanda Sorrow, PTA 09/08/23 11:49 AM

## 2023-09-13 ENCOUNTER — Ambulatory Visit: Admitting: Physical Therapy

## 2023-09-13 ENCOUNTER — Encounter: Payer: Self-pay | Admitting: Physical Therapy

## 2023-09-13 DIAGNOSIS — R262 Difficulty in walking, not elsewhere classified: Secondary | ICD-10-CM | POA: Diagnosis not present

## 2023-09-13 DIAGNOSIS — R296 Repeated falls: Secondary | ICD-10-CM

## 2023-09-13 NOTE — Therapy (Signed)
 OUTPATIENT PHYSICAL THERAPY LOWER EXTREMITY TREATMENT     Patient Name: Tasha Hunt MRN: [id number] DOB:02/15/1945, 78 y.o., female Today's Date: 09/13/2023   END OF SESSION:  PT End of Session - 09/13/23 1145     Visit Number 11    Date for PT Re-Evaluation 10/18/23    PT Start Time 1145    PT Stop Time 1230    PT Time Calculation (min) 45 min    Activity Tolerance Patient tolerated treatment well    Behavior During Therapy [facility] for tasks assessed/performed            Past Medical History:  Diagnosis Date   Allergic rhinitis, cause unspecified    Aneurysm of splenic artery (HCC)    noted since 2002   Anxiety    Arthritis    hands   Asthma    Complication of anesthesia    COPD (chronic obstructive pulmonary disease) (HCC)    Depressive disorder, not elsewhere classified    Dyspnea    Esophageal reflux    H/O hiatal hernia    Hypertension    Obesity, unspecified    Pneumonia    03/27/11 many many years ago   PONV (postoperative nausea and vomiting)    Ulcerative colitis (HCC)    Past Surgical History:  Procedure Laterality Date   ABDOMINAL HYSTERECTOMY  1980   ARTERIAL ANEURYSM REPAIR  03/27/2011   splenic artery   BREAST BIOPSY  2003   right   BREAST BIOPSY  2012   left   BREAST LUMPECTOMY WITH RADIOACTIVE SEED LOCALIZATION Right 07/06/2019   Procedure: RIGHT BREAST LUMPECTOMY WITH RADIOACTIVE SEED LOCALIZATION;  Surgeon: Vanderbilt Ned, MD;  Location: [facility];  Service: General;  Laterality: Right;   CATARACT EXTRACTION W/ INTRAOCULAR LENS IMPLANT Bilateral    CHOLECYSTECTOMY  2002   EMBOLIZATION N/A 03/27/2011   Procedure: EMBOLIZATION;  Surgeon: Carlin FORBES Haddock, MD;  Location: [facility] CATH LAB;  Service: Cardiovascular;  Laterality: N/A;   HERNIA REPAIR     KNEE CLOSED REDUCTION Left 10/24/2021   Procedure: CLOSED MANIPULATION KNEE;  Surgeon: Ernie Cough, MD;  Location: [facility];  Service: Orthopedics;  Laterality: Left;    LAPAROSCOPIC NISSEN FUNDOPLICATION  2002   NASAL SEPTUM SURGERY  1985   deviated septum   REVERSE SHOULDER ARTHROPLASTY Right 11/17/2019   Procedure: REVERSE SHOULDER ARTHROPLASTY;  Surgeon: Kay Kemps, MD;  Location: [facility];  Service: Orthopedics;  Laterality: Right;  interscalene block   TONSILLECTOMY  1948   TOTAL KNEE ARTHROPLASTY Right 04/20/2016   Procedure: RIGHT TOTAL KNEE ARTHROPLASTY;  Surgeon: Cough Ernie, MD;  Location: [facility];  Service: Orthopedics;  Laterality: Right;   TOTAL KNEE ARTHROPLASTY Left 06/19/2021   Patient Active Problem List   Diagnosis Date Noted   SDH (subdural hematoma) (HCC) 07/28/2022   S/P shoulder replacement, right 11/17/2019   S/P right TKA 04/20/2016   Aneurysm of splenic artery (HCC) 03/19/2011    PCP: Ainsley. Truman, MD  REFERRING PROVIDER: Jackson Mow, [place], [place]  REFERRING DIAG: h/o falls  THERAPY DIAG:  Difficulty in walking, not elsewhere classified  Repeated falls  Rationale for Evaluation and Treatment: Rehabilitation  ONSET DATE: most recent 06/30/23  SUBJECTIVE:   SUBJECTIVE STATEMENT:   ok No falls     EVAL: I have fallen several times over the last year, have hit my head a few times, most recently I fell backwards trying to open the car door, and landed on back of my head, also  I tried to climb a step and my L leg gave away and I fell on my face. I do feel like I've been swimmy headed  since all of these falls started, about 2 years.  PERTINENT HISTORY: Frequent falls, has undergone knee replacements each knee, also R TSA,. Has arthritis in lower back.  PAIN:  Are you having pain? Yes: NPRS scale: 0/10 Pain location: R  shoulder Pain description: very stiff, aches everywhere  Aggravating factors: maybe rain and dampness/OA pains Relieving factors: ice  PRECAUTIONS: Fall  RED FLAGS: None   WEIGHT BEARING RESTRICTIONS: No  FALLS:  Has patient fallen in last 6 months? Yes several falls   LIVING  ENVIRONMENT: Lives with: lives alone Lives in: House/apartment Stairs: No Has following equipment at home: Single point cane and Environmental consultant - 2 wheeled  OCCUPATION: retired  PLOF: Independent  PATIENT GOALS: avoid falls  NEXT MD VISIT: mid August   OBJECTIVE:  Note: Objective measures were completed at Evaluation unless otherwise noted.  DIAGNOSTIC FINDINGS: na    COGNITION: Overall cognitive status: Within functional limits for tasks assessed     SENSATION: WFL   POSTURE: mild B hip flexion, wider than normal base of support, rounded thoracic spine   PALPATION: na  LOWER EXTREMITY ROM: grossly wfl  LOWER EXTREMITY MMT: grossly 4/5 with burst testing throughout  08/25/23  Quads 4+/5, hams 4+/5, hip flexors 4+/5, hip ABD 3/5     FUNCTIONAL TESTS:  5 times sit to stand: 15.45; 08/25/23 14.3 seconds no UEs    BERG 34/54  FGA 10/30    08/25/23 0001  Berg Balance Test  Sit to Stand 4  Standing Unsupported 4  Sitting with Back Unsupported but Feet Supported on Floor or Stool 4  Stand to Sit 4  Transfers 4  Standing Unsupported with Eyes Closed 4  Standing Unsupported with Feet Together 3  From Standing, Reach Forward with Outstretched Arm 2  From Standing Position, Pick up Object from Floor 3  From Standing Position, Turn to Look Behind Over each Shoulder 3  Turn 360 Degrees 2  Standing Unsupported, Alternately Place Feet on Step/Stool 3  Standing Unsupported, One Foot in Front 1  Standing on One Leg 1  Total Score 42     GAIT: Distance walked: 30' in clinic Assistive device utilized: Single point cane Level of assistance: Modified independence Comments: very slow cadence, and gait speed, decreased stride length B                                                                                                                                 TREATMENT DATE:  09/13/23  NuStep L5 x 7 min Hs curls 25lb 2x12 Leg Ext 5lb 2x12 From airex 8in step ups x10  each  Sit to stand LE on airex 2x10 On airex alt 8in box taps   Shoulder Ext 5lb 2x10   09/08/23 NuStep L5 x 6 min  Goals   FGA 19  Gait outside around the two L side islands and the front island. Very fatigue requiring one standing rest break.    09/06/23 NuStep L5 x 6 min 6in step ups x10 4in lateral step ups x10  Sit to stand on airex 2x10  On aires standing ball toss  On airex alt [facility] taps  HS curls 25lb 2x10 Leg Ext 10lb 2x10  09/01/23 NuStep L5 x 6 min Goals   Berg 47/56 Resisted gait 30lb 4 way x 3 each 4in box on airex step ups x10 each   08/30/23 NuStep L 5 x 5 min  Gait around the front island in the back no AD Sit to stand x10, on airex  Slt 6 in box taps on airex x10 Side steps on balance bean in //  Tandem walking in //  08/25/23  Lots of education today on findings of old CVAs, what this means about f/u with MD, lifestyle factors that might cause a CVA, CVA prevention, BEFAST, difference between ischemic and hemorrhagic CVAs/interventions, more at risk of additional CVAs with hx of some   MMT, Berg, 5x STS and education on all findings/relevance to finding of chronic CVAs and POC/prognosis with PT   Nustep L5x8 minutes seat 8 BLEs only    08/17/23 NuStep L 5 x 6 min  Sit to stand x10, on airex  Slt 6 in box taps on airex x10 Side steps at mat  Side steps over T band HS curls 25lb 2x12 BERG 39/56  08/11/23 NuStep L 5 x 6 min  20lb resisted gait 4 way x 3 each HS curls 25lb 2x10 Leg Ext 10lb 2x10 Sit to stand holding yellow ball 2x10  08/02/23:  In ll bars:  Lunges on compliant surface of BOSU 10 x each leg Static standing semi tandem 20 sec holds  Heel/toe rocks on airex in ll bars 15x Knee ext 5# 2 x 15 Knee flexion 20 # 2 x 15 Standing at sink for heel/ toe raises, alt hip abd, alt hip ext, and high marches for home program, provided with written printed instructions Nustep level 5 x 6 min Ue's and LE's  07/29/23 NuStep L4 x 6 mn Alt  4in box taps 2x8 very unstable during RLE SLS phase  Side steps at mat table   Hs curls 20lb 2x10 Leg Ext 5lb 2x10 Backwards walking Forward marching  PATIENT EDUCATION:  Education details: POC, goals  Person educated: Patient Education method: Explanation, Demonstration, Tactile cues, Verbal cues, and Handouts Education comprehension: verbalized understanding, returned demonstration, verbal cues required, tactile cues required, and needs further education  HOME EXERCISE PROGRAM: Access Code: [email or web address] URL: [email or web address] Date: 08/02/2023 Prepared by: Greig Speaks  Exercises - Heel Toe Raises with Counter Support  - 1 x daily - 7 x weekly - 3 sets - 10 reps - Standing Hip Abduction with Counter Support  - 1 x daily - 7 x weekly - 3 sets - 10 reps - Standing Hip Extension with Counter Support  - 1 x daily - 7 x weekly - 3 sets - 10 reps - Standing March with Counter Support  - 1 x daily - 7 x weekly - 3 sets - 10 reps TBD  ASSESSMENT:  CLINICAL IMPRESSION:  Pt enters doing well. Difficulty today with interventions utilizing airex paid. LOB x 3 with step ups requiring mod assist to regain balance. Postural weakness with shoulder ext. Fatigue with sit to stands.  No repots of pain during  session. Will continue to progress as able/tolerated.   OBJECTIVE IMPAIRMENTS: decreased activity tolerance, decreased balance, decreased coordination, decreased endurance, difficulty walking, decreased strength, and postural dysfunction.   ACTIVITY LIMITATIONS: carrying, lifting, standing, squatting, transfers, locomotion level, and caring for others  PARTICIPATION LIMITATIONS: meal prep, cleaning, laundry, and community activity  PERSONAL FACTORS: Age, Behavior pattern, Fitness, Past/current experiences, Time since onset of injury/illness/exacerbation, and 1-2 comorbidities: B TKA, h/o frequent falls  are also affecting patient's functional outcome.   REHAB POTENTIAL:  Good  CLINICAL DECISION MAKING: Evolving/moderate complexity  EVALUATION COMPLEXITY: Moderate   GOALS: Goals reviewed with patient? Yes  SHORT TERM GOALS: Target date:  2 weeks, 08/09/23 I HEP Baseline: Goal status: Met 08/11/23  LONG TERM GOALS: Target date: 12 weeks, 10/18/23  Improve Berg from 34/54 to 45/54 or greater  Baseline:  Goal status: Progressing 08/17/23, Met 09/01/23 47/56  2.  Improve FGA from 10/30 to 20/30 or greater Baseline:  Goal status: Progressing 19/30 09/08/23  3.  Improve activity tolerance/endurance, able to walk 800' or greater without fatigue, to improve community ambulation Baseline: TBD Goal status: Progressing 09/08/23   PLAN:  PT FREQUENCY: 2x/week  PT DURATION: 12 weeks  PLANNED INTERVENTIONS: 97110-Therapeutic exercises, 97530- Therapeutic activity, 97112- Neuromuscular re-education, 97535- Self Care, and [id number]- Manual therapy  PLAN FOR NEXT SESSION: balance and LE strength, careful with UE exercises (R shoulder and neck pain), functional activity tolerance   Tanda Sorrow, PTA 09/13/23 11:45 AM

## 2023-09-15 ENCOUNTER — Encounter: Payer: Self-pay | Admitting: Physical Therapy

## 2023-09-15 ENCOUNTER — Ambulatory Visit: Admitting: Physical Therapy

## 2023-09-15 DIAGNOSIS — R296 Repeated falls: Secondary | ICD-10-CM

## 2023-09-15 DIAGNOSIS — R262 Difficulty in walking, not elsewhere classified: Secondary | ICD-10-CM

## 2023-09-15 NOTE — Therapy (Signed)
 OUTPATIENT PHYSICAL THERAPY LOWER EXTREMITY TREATMENT     Patient Name: Tasha Hunt MRN: [id number] DOB:1945/10/12, 78 y.o., female Today's Date: 09/15/2023   END OF SESSION:  PT End of Session - 09/15/23 1148     Visit Number 12    Date for PT Re-Evaluation 10/18/23    PT Start Time 1148    PT Stop Time 1230    PT Time Calculation (min) 42 min    Activity Tolerance Patient tolerated treatment well    Behavior During Therapy WFL for tasks assessed/performed            Past Medical History:  Diagnosis Date   Allergic rhinitis, cause unspecified    Aneurysm of splenic artery (HCC)    noted since 2002   Anxiety    Arthritis    hands   Asthma    Complication of anesthesia    COPD (chronic obstructive pulmonary disease) (HCC)    Depressive disorder, not elsewhere classified    Dyspnea    Esophageal reflux    H/O hiatal hernia    Hypertension    Obesity, unspecified    Pneumonia    03/27/11 many many years ago   PONV (postoperative nausea and vomiting)    Ulcerative colitis (HCC)    Past Surgical History:  Procedure Laterality Date   ABDOMINAL HYSTERECTOMY  1980   ARTERIAL ANEURYSM REPAIR  03/27/2011   splenic artery   BREAST BIOPSY  2003   right   BREAST BIOPSY  2012   left   BREAST LUMPECTOMY WITH RADIOACTIVE SEED LOCALIZATION Right 07/06/2019   Procedure: RIGHT BREAST LUMPECTOMY WITH RADIOACTIVE SEED LOCALIZATION;  Surgeon: Vanderbilt Ned, MD;  Location: [facility];  Service: General;  Laterality: Right;   CATARACT EXTRACTION W/ INTRAOCULAR LENS IMPLANT Bilateral    CHOLECYSTECTOMY  2002   EMBOLIZATION N/A 03/27/2011   Procedure: EMBOLIZATION;  Surgeon: Carlin FORBES Haddock, MD;  [facility] C[facility] LAB;  Service: Cardiovascular;  Laterality: N/A;   HERNIA REPAIR     KNEE CLOSED REDUCTION Left 10/24/2021   Procedure: CLOSED MANIPULATION KNEE;  Surgeon: Ernie Cough, MD;  Location: [facility];  Service: Orthopedics;  Laterality: Left;    LAPAROSCOPIC NISSEN FUNDOPLICATION  2002   NASAL SEPTUM SURGERY  1985   deviated septum   REVERSE SHOULDER ARTHROPLASTY Right 11/17/2019   Procedure: REVERSE SHOULDER ARTHROPLASTY;  Surgeon: Kay Kemps, MD;  Location: [facility];  Service: Orthopedics;  Laterality: Right;  interscalene block   TONSILLECTOMY  1948   TOTAL KNEE ARTHROPLASTY Right 04/20/2016   Procedure: RIGHT TOTAL KNEE ARTHROPLASTY;  Surgeon: Cough Ernie, MD;  Location: [facility];  Service: Orthopedics;  Laterality: Right;   TOTAL KNEE ARTHROPLASTY Left 06/19/2021   Patient Active Problem List   Diagnosis Date Noted   SDH (subdural hematoma) (HCC) 07/28/2022   S/P shoulder replacement, right 11/17/2019   S/P right TKA 04/20/2016   Aneurysm of splenic artery (HCC) 03/19/2011    PCP: Ainsley. Truman, MD  REFERRING PROVIDER: Jackson Mow, [place], [place]  REFERRING DIAG: h/o falls  THERAPY DIAG:  Repeated falls  Difficulty in walking, not elsewhere classified  Rationale for Evaluation and Treatment: Rehabilitation  ONSET DATE: most recent 06/30/23  SUBJECTIVE:   SUBJECTIVE STATEMENT:   I feel good     EVAL: I have fallen several times over the last year, have hit my head a few times, most recently I fell backwards trying to open the car door, and landed on back of my head, also  I tried to climb a step and my L leg gave away and I fell on my face. I do feel like I've been swimmy headed  since all of these falls started, about 2 years.  PERTINENT HISTORY: Frequent falls, has undergone knee replacements each knee, also R TSA,. Has arthritis in lower back.  PAIN:  Are you having pain? Yes: NPRS scale: 0/10 Pain location: R  shoulder Pain description: very stiff, aches everywhere  Aggravating factors: maybe rain and dampness/OA pains Relieving factors: ice  PRECAUTIONS: Fall  RED FLAGS: None   WEIGHT BEARING RESTRICTIONS: No  FALLS:  Has patient fallen in last 6 months? Yes several falls   LIVING  ENVIRONMENT: Lives with: lives alone Lives in: House/apartment Stairs: No Has following equipment at home: Single point cane and Environmental consultant - 2 wheeled  OCCUPATION: retired  PLOF: Independent  PATIENT GOALS: avoid falls  NEXT MD VISIT: mid August   OBJECTIVE:  Note: Objective measures were completed at Evaluation unless otherwise noted.  DIAGNOSTIC FINDINGS: na    COGNITION: Overall cognitive status: Within functional limits for tasks assessed     SENSATION: WFL   POSTURE: mild B hip flexion, wider than normal base of support, rounded thoracic spine   PALPATION: na  LOWER EXTREMITY ROM: grossly wfl  LOWER EXTREMITY MMT: grossly 4/5 with burst testing throughout  08/25/23  Quads 4+/5, hams 4+/5, hip flexors 4+/5, hip ABD 3/5     FUNCTIONAL TESTS:  5 times sit to stand: 15.45; 08/25/23 14.3 seconds no UEs    BERG 34/54  FGA 10/30    08/25/23 0001  Berg Balance Test  Sit to Stand 4  Standing Unsupported 4  Sitting with Back Unsupported but Feet Supported on Floor or Stool 4  Stand to Sit 4  Transfers 4  Standing Unsupported with Eyes Closed 4  Standing Unsupported with Feet Together 3  From Standing, Reach Forward with Outstretched Arm 2  From Standing Position, Pick up Object from Floor 3  From Standing Position, Turn to Look Behind Over each Shoulder 3  Turn 360 Degrees 2  Standing Unsupported, Alternately Place Feet on Step/Stool 3  Standing Unsupported, One Foot in Front 1  Standing on One Leg 1  Total Score 42     GAIT: Distance walked: 59' in clinic Assistive device utilized: Single point cane Level of assistance: Modified independence Comments: very slow cadence, and gait speed, decreased stride length B                                                                                                                                 TREATMENT DATE:  09/15/23 NuStep L5 x 7 min Hs curls 25lb 2x12 Leg Ext 5lb 2x12 GOALS  Gait outside around the  two L side islands and the front island. Some fatigue but no breaks needed  09/13/23  NuStep L5 x 7 min Hs curls 25lb 2x12 Leg Ext 5lb 2x12  From airex 8in step ups x10 each  Sit to stand LE on airex 2x10 On airex alt 8in box taps   Shoulder Ext 5lb 2x10   09/08/23 NuStep L5 x 6 min Goals   FGA 19  Gait outside around the two L side islands and the front island. Very fatigue requiring one standing rest break.    09/06/23 NuStep L5 x 6 min 6in step ups x10 4in lateral step ups x10  Sit to stand on airex 2x10  On aires standing ball toss  On airex alt [facility] taps  HS curls 25lb 2x10 Leg Ext 10lb 2x10  09/01/23 NuStep L5 x 6 min Goals   Berg 47/56 Resisted gait 30lb 4 way x 3 each 4in box on airex step ups x10 each   08/30/23 NuStep L 5 x 5 min  Gait around the front island in the back no AD Sit to stand x10, on airex  Slt 6 in box taps on airex x10 Side steps on balance bean in //  Tandem walking in //  08/25/23  Lots of education today on findings of old CVAs, what this means about f/u with MD, lifestyle factors that might cause a CVA, CVA prevention, BEFAST, difference between ischemic and hemorrhagic CVAs/interventions, more at risk of additional CVAs with hx of some   MMT, Berg, 5x STS and education on all findings/relevance to finding of chronic CVAs and POC/prognosis with PT   Nustep L5x8 minutes seat 8 BLEs only    08/17/23 NuStep L 5 x 6 min  Sit to stand x10, on airex  Slt 6 in box taps on airex x10 Side steps at mat  Side steps over T band HS curls 25lb 2x12 BERG 39/56  08/11/23 NuStep L 5 x 6 min  20lb resisted gait 4 way x 3 each HS curls 25lb 2x10 Leg Ext 10lb 2x10 Sit to stand holding yellow ball 2x10  08/02/23:  In ll bars:  Lunges on compliant surface of BOSU 10 x each leg Static standing semi tandem 20 sec holds  Heel/toe rocks on airex in ll bars 15x Knee ext 5# 2 x 15 Knee flexion 20 # 2 x 15 Standing at sink for heel/ toe raises, alt  hip abd, alt hip ext, and high marches for home program, provided with written printed instructions Nustep level 5 x 6 min Ue's and LE's  07/29/23 NuStep L4 x 6 mn Alt 4in box taps 2x8 very unstable during RLE SLS phase  Side steps at mat table   Hs curls 20lb 2x10 Leg Ext 5lb 2x10 Backwards walking Forward marching  PATIENT EDUCATION:  Education details: POC, goals  Person educated: Patient Education method: Explanation, Demonstration, Tactile cues, Verbal cues, and Handouts Education comprehension: verbalized understanding, returned demonstration, verbal cues required, tactile cues required, and needs further education  HOME EXERCISE PROGRAM: Access Code: [email or web address] URL: [email or web address] Date: 08/02/2023 Prepared by: Greig Speaks  Exercises - Heel Toe Raises with Counter Support  - 1 x daily - 7 x weekly - 3 sets - 10 reps - Standing Hip Abduction with Counter Support  - 1 x daily - 7 x weekly - 3 sets - 10 reps - Standing Hip Extension with Counter Support  - 1 x daily - 7 x weekly - 3 sets - 10 reps - Standing March with Counter Support  - 1 x daily - 7 x weekly - 3 sets - 10 reps TBD  ASSESSMENT:  CLINICAL IMPRESSION:  Pt enters doing well  with no pain. Outdoor ambulation completed without rest but with some fatigue. She reports being pleased with her current functional status with no limitations.  OBJECTIVE IMPAIRMENTS: decreased activity tolerance, decreased balance, decreased coordination, decreased endurance, difficulty walking, decreased strength, and postural dysfunction.   ACTIVITY LIMITATIONS: carrying, lifting, standing, squatting, transfers, locomotion level, and caring for others  PARTICIPATION LIMITATIONS: meal prep, cleaning, laundry, and community activity  PERSONAL FACTORS: Age, Behavior pattern, Fitness, Past/current experiences, Time since onset of injury/illness/exacerbation, and 1-2 comorbidities: B TKA, h/o frequent falls  are also  affecting patient's functional outcome.   REHAB POTENTIAL: Good  CLINICAL DECISION MAKING: Evolving/moderate complexity  EVALUATION COMPLEXITY: Moderate   GOALS: Goals reviewed with patient? Yes  SHORT TERM GOALS: Target date:  2 weeks, 08/09/23 I HEP Baseline: Goal status: Met 08/11/23  LONG TERM GOALS: Target date: 12 weeks, 10/18/23  Improve Berg from 34/54 to 45/54 or greater  Baseline:  Goal status: Progressing 08/17/23, Met 09/01/23 47/56  2.  Improve FGA from 10/30 to 20/30 or greater Baseline:  Goal status: Progressing 19/30 09/08/23,   3.  Improve activity tolerance/endurance, able to walk 800' or greater without fatigue, to improve community ambulation Baseline: TBD Goal status: Progressing 09/08/23, Met 09/15/23   PLAN:  PT FREQUENCY: 2x/week  PT DURATION: 12 weeks  PLANNED INTERVENTIONS: 97110-Therapeutic exercises, 97530- Therapeutic activity, 97112- Neuromuscular re-education, 97535- Self Care, and 02859- Manual therapy  PLAN FOR NEXT SESSION: balance and LE strength, careful with UE exercises (R shoulder and neck pain), functional activity tolerance  PHYSICAL THERAPY DISCHARGE SUMMARY  Visits from Start of Care: 1 Patient agrees to discharge. Patient goals were partially met. Patient is being discharged due to being pleased with the current functional level.  Tanda Sorrow, PTA 09/15/23 11:48 AM

## 2023-09-16 ENCOUNTER — Other Ambulatory Visit (HOSPITAL_BASED_OUTPATIENT_CLINIC_OR_DEPARTMENT_OTHER): Payer: Self-pay

## 2023-09-16 ENCOUNTER — Emergency Department (HOSPITAL_BASED_OUTPATIENT_CLINIC_OR_DEPARTMENT_OTHER)
Admission: EM | Admit: 2023-09-16 | Discharge: 2023-09-16 | Disposition: A | Discharge status: Home / Self Care | Attending: Emergency Medicine | Admitting: Emergency Medicine

## 2023-09-16 ENCOUNTER — Other Ambulatory Visit: Payer: Self-pay

## 2023-09-16 ENCOUNTER — Encounter (HOSPITAL_BASED_OUTPATIENT_CLINIC_OR_DEPARTMENT_OTHER): Payer: Self-pay | Admitting: Emergency Medicine

## 2023-09-16 ENCOUNTER — Emergency Department (HOSPITAL_BASED_OUTPATIENT_CLINIC_OR_DEPARTMENT_OTHER)

## 2023-09-16 DIAGNOSIS — M549 Dorsalgia, unspecified: Secondary | ICD-10-CM | POA: Diagnosis present

## 2023-09-16 DIAGNOSIS — S2220XA Unspecified fracture of sternum, initial encounter for closed fracture: Secondary | ICD-10-CM

## 2023-09-16 DIAGNOSIS — R072 Precordial pain: Secondary | ICD-10-CM | POA: Diagnosis present

## 2023-09-16 DIAGNOSIS — Y92009 Unspecified place in unspecified non-institutional (private) residence as the place of occurrence of the external cause: Secondary | ICD-10-CM | POA: Diagnosis not present

## 2023-09-16 DIAGNOSIS — R918 Other nonspecific abnormal finding of lung field: Secondary | ICD-10-CM | POA: Insufficient documentation

## 2023-09-16 DIAGNOSIS — S2222XA Fracture of body of sternum, initial encounter for closed fracture: Secondary | ICD-10-CM | POA: Insufficient documentation

## 2023-09-16 DIAGNOSIS — S0990XA Unspecified injury of head, initial encounter: Secondary | ICD-10-CM | POA: Diagnosis present

## 2023-09-16 DIAGNOSIS — D72829 Elevated white blood cell count, unspecified: Secondary | ICD-10-CM | POA: Diagnosis not present

## 2023-09-16 DIAGNOSIS — J449 Chronic obstructive pulmonary disease, unspecified: Secondary | ICD-10-CM | POA: Insufficient documentation

## 2023-09-16 DIAGNOSIS — W0110XA Fall on same level from slipping, tripping and stumbling with subsequent striking against unspecified object, initial encounter: Secondary | ICD-10-CM | POA: Insufficient documentation

## 2023-09-16 LAB — BASIC METABOLIC PANEL WITH GFR
Anion gap: 14 (ref 5–15)
BUN: 10 mg/dL (ref 8–23)
CO2: 26 mmol/L (ref 22–32)
Calcium: 10 mg/dL (ref 8.9–10.3)
Chloride: 98 mmol/L (ref 98–111)
Creatinine, Ser: 0.64 mg/dL (ref 0.44–1.00)
GFR, Estimated: >60 mL/min (ref >60)
Glucose, Bld: 118 mg/dL — ABNORMAL HIGH (ref 70–99)
Potassium: 4.3 mmol/L (ref 3.5–5.1)
Sodium: 138 mmol/L (ref 135–145)

## 2023-09-16 LAB — CBC
HCT: 44.2 % (ref 36.0–46.0)
Hemoglobin: 15.1 g/dL — ABNORMAL HIGH (ref 12.0–15.0)
MCH: 31.4 pg (ref 26.0–34.0)
MCHC: 34.2 g/dL (ref 30.0–36.0)
MCV: 91.9 fL (ref 80.0–100.0)
Platelets: 223 K/uL (ref 150–400)
RBC: 4.81 MIL/uL (ref 3.87–5.11)
RDW: 13.8 % (ref 11.5–15.5)
WBC: 18.7 K/uL — ABNORMAL HIGH (ref 4.0–10.5)
nRBC: 0 % (ref 0.0–0.2)

## 2023-09-16 MED ORDER — OXYCODONE HCL 5 MG PO TABS
5.0000 mg | ORAL_TABLET | Freq: Three times a day (TID) | ORAL | 0 refills | Status: AC | PRN
  Filled 2023-09-16: qty 15, 5d supply, fill #0

## 2023-09-16 MED ORDER — KETOROLAC TROMETHAMINE 30 MG/ML IJ SOLN
30.0000 mg | Freq: Once | INTRAMUSCULAR | Status: AC
  Administered 2023-09-16: 30 mg via INTRAMUSCULAR
  Filled 2023-09-16: qty 1

## 2023-09-16 NOTE — ED Notes (Signed)
 Patient given discharge instructions. Questions were answered. Patient verbalized understanding of discharge instructions and care at home. Patient educated on Spirometer use at home.

## 2023-09-16 NOTE — ED Notes (Signed)
 Patient placed on 2L [place] at this time due to patient sats dropping to 89%. Patient taking shallow breaths due to pain when breathing. RN aware of pain.

## 2023-09-16 NOTE — Discharge Instructions (Signed)
 Your broken breastbone will heal over the next 4 to 8 weeks.  Please use your walker at all times at home for extra stability.  Schedule follow-up appointment with your doctor to recheck your symptoms.  Your white blood cell count was high today, which is nonspecific, but should be followed up by your doctor in the future.  If you have difficulty breathing, fevers at home, weakness, or any further falls, call 911 or return to the hospital.

## 2023-09-16 NOTE — ED Triage Notes (Signed)
 Pt reports mechanical fall this AM while trying to pick up cat. Reports she fell backwards, hit posterior head, back.   C/o mid chest pain, LUE laceration to forearm. Reports multiple falls recently.

## 2023-09-16 NOTE — ED Provider Notes (Signed)
 [facility] Provider Note   CSN: [id number] Arrival date & time: 09/16/23  1118     Patient presents with: Tasha Hunt Tasha Hunt is a 78 y.o. female presented to ED with a fall at home and pain in her chest.  Patient reports that she was trying to pick up her cat to take it to the vet and lost her balance and fell backwards, striking the back of her head on the ground.  She has been having pain in her sternum since then.  She drove herself here.  She lives alone with her cats.  She has been seen by PT and neurology for multiple falls over the past 2 years.  She has chronic balance problems.  She went to PT just yesterday.  I reviewed the patient's external records.  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   HPI     Prior to Admission medications   Medication Sig Start Date End Date Taking? Authorizing Provider  oxyCODONE  (ROXICODONE ) 5 MG immediate release tablet Take 1 tablet (5 mg total) by mouth every 8 (eight) hours as needed for up to 15 doses for severe pain (pain score 7-10). 09/16/23  Yes Delfin Squillace, Donnice PARAS, MD  acetaminophen  (TYLENOL ) 325 MG tablet Take 650 mg by mouth every 6 (six) hours as needed for moderate pain or headache.    [provider]  albuterol  (VENTOLIN  HFA) 108 (90 Base) MCG/ACT inhaler Inhale 1 puff into the lungs every 6 (six) hours as needed for wheezing or shortness of breath.    [provider]  ALPRAZolam  (XANAX ) 0.25 MG tablet Take 0.25 mg by mouth 2 (two) times daily as needed for anxiety.     [provider]  atorvastatin  (LIPITOR) 20 MG tablet Take 20 mg by mouth daily.    [provider]  Camphor-Menthol -Methyl Sal (SALONPAS) 3.01-25-08 % PTCH Apply 1 patch topically daily.    [provider]  cetirizine (ZYRTEC) 10 MG tablet Take 10 mg by mouth daily.    [provider]   Cholecalciferol  (DIALYVITE VITAMIN D  5000) 125 MCG (5000 UT) capsule Take 5,000 Units by mouth daily.    [provider]  cyclobenzaprine  (FLEXERIL ) 10 MG tablet Take 0.5 tablets (5 mg total) by mouth 2 (two) times daily as needed for muscle spasms. 11/12/22   Kingsley, Victoria K, DO  DILT-XR 180 MG 24 hr capsule Take 180 mg by mouth daily.  01/21/15   [provider]  DULoxetine  (CYMBALTA ) 60 MG capsule Take 60 mg by mouth daily.    [provider]  fluticasone  (FLONASE ) 50 MCG/ACT nasal spray Place 2 sprays into the nose daily.    [provider]  Fluticasone  Furoate (ARNUITY ELLIPTA ) 100 MCG/ACT AEPB Inhale 1 Dose into the lungs daily. 02/12/15   Kozlow, Camellia PARAS, MD  folic acid  (FOLVITE ) 1 MG tablet Take 1 mg by mouth daily.    [provider]  hydrochlorothiazide  (HYDRODIURIL ) 12.5 MG tablet Take 12.5 mg by mouth daily.  02/09/15   [provider]  HYDROcodone -acetaminophen  (NORCO/VICODIN) 5-325 MG tablet Take 1-2 tablets by mouth every 6 (six) hours as needed for moderate pain. 07/31/22   Bergman, Meghan D, NP  ketoconazole  (NIZORAL ) 2 % cream Apply 1 application topically daily as needed (fungus).    [provider]  Misc Natural Products (OSTEO BI-FLEX ADV TRIPLE ST  PO) Take 2 tablets by mouth daily.    [provider]  Polyethyl Glycol-Propyl Glycol (SYSTANE) 0.4-0.3 % SOLN Place 1 drop into both eyes 4 (four) times daily as needed (for dry eyes).    [provider]  promethazine  (PHENERGAN ) 12.5 MG tablet Take 1 tablet (12.5 mg total) by mouth every 6 (six) hours as needed for nausea or vomiting. 07/31/22   Bergman, Meghan D, NP  sulfaSALAzine  (AZULFIDINE ) 500 MG tablet Take 1,000 mg by mouth 2 (two) times daily. 09/24/21   [provider]  traMADol (ULTRAM) 50 MG tablet Take 50 mg by mouth every 6 (six) hours as needed for moderate pain. Use as needed for moderate pain.    [provider]     Allergies: Morphine  and codeine    Review of Systems  Updated Vital Signs BP (!) 141/72   Pulse 80   Temp 98.6 F (37 C)   Resp 20   Ht 5' 1 (1.549 m)   Wt 81.6 kg   SpO2 92%   BMI 34.01 kg/m   Physical Exam Constitutional:      General: She is in acute distress.  HENT:     Head: Normocephalic and atraumatic.  Eyes:     Conjunctiva/sclera: Conjunctivae normal.     Pupils: Pupils are equal, round, and reactive to light.  Cardiovascular:     Rate and Rhythm: Normal rate and regular rhythm.  Pulmonary:     Effort: Pulmonary effort is normal. No respiratory distress.  Abdominal:     General: There is no distension.     Tenderness: There is no abdominal tenderness.  Musculoskeletal:     Comments: Tenderness to palpation of the sternum without visible crepitus or deformity, no significant rib tenderness in the bilateral ribs, no cervical midline tenderness 1 cm laceration to the left forearm  Skin:    General: Skin is warm and dry.  Neurological:     General: No focal deficit present.     Mental Status: She is alert. Mental status is at baseline.  Psychiatric:        Mood and Affect: Mood normal.        Behavior: Behavior normal.     (all labs ordered are listed, but only abnormal results are displayed) Labs Reviewed  BASIC METABOLIC PANEL WITH GFR - Abnormal; Notable for the following components:      Result Value   Glucose, Bld 118 (*)    All other components within normal limits  CBC - Abnormal; Notable for the following components:   WBC 18.7 (*)    Hemoglobin 15.1 (*)    All other components within normal limits    EKG: EKG Interpretation Date/Time:  Thursday September 16 2023 11:28:12 EDT Ventricular Rate:  89 PR Interval:  154 QRS Duration:  103 QT Interval:  382 QTC Calculation: 465 R Axis:   -51  Text Interpretation: Sinus rhythm Left anterior fascicular block Abnormal R-wave progression, late transition Left ventricular hypertrophy Confirmed  by Cottie Cough [phone]) on 09/16/2023 11:31:47 AM  Radiology: CT Chest Wo Contrast Result Date: 09/16/2023 CLINICAL DATA:  Chest trauma, blunt eval for sternal fx EXAM: CT CHEST WITHOUT CONTRAST TECHNIQUE: Multidetector CT imaging of the chest was performed following the standard protocol without IV contrast. RADIATION DOSE REDUCTION: This exam was performed according to the departmental dose-optimization program which includes automated exposure control, adjustment of the mA and/or kV according to patient size and/or use of iterative reconstruction technique. COMPARISON:  June 21, 2021 CTA chest report only (no images available for review). FINDINGS: Cardiovascular: No cardiomegaly or pericardial effusion. Ectasia of the ascending thoracic aorta, without aortic aneurysm. Aortic atherosclerosis. Dense multi-vessel coronary atherosclerosis. Mediastinum/Nodes: No mediastinal mass.No mediastinal, hilar, or axillary lymphadenopathy. Lungs/Pleura: The midline trachea and bronchi are patent. Subsegmental atelectasis in the lingula. No focal airspace consolidation, pleural effusion, or pneumothorax. Posterior bibasilar dependent atelectasis. 3 mm right lower lobe nodule (axial 66). 3 mm right upper lobe nodule (axial 33). Musculoskeletal: Diffuse osteopenia. Buckling of the anterior context of the superior sternal body (sagittal 93). Right glenohumeral joint arthroplasty. Moderate osteoarthritis of the left glenohumeral joint. Multilevel degenerative disc disease of the spine. Upper Abdomen: No acute abnormality in the partially visualized upper abdomen. IMPRESSION: 1. Buckling of the anterior cortex of the superior sternal body, worrisome for a nondisplaced fracture. No mediastinal hematoma. Correlation with point tenderness recommended. 2. No pneumonia, pulmonary edema, or pleural effusion. No pneumothorax. 3. A couple of 3 mm nodules are present in the right upper and right lower lobes. Comparison with outside  imaging is recommended to document stability. If none is available, follow-up could be considered as documented below. Incidental, multiple solid nodules < 6 mm: Low Risk: No Routine follow-up. High risk: Optional CT at 12 months. NOTE: These recommendations do not apply to patients with immunosuppression or patients with known primary cancer. REFERENCE: Fleischner Society 2017 Guidelines for Management of Incidentally Detected Pulmonary Nodules in Adults. Radiology 2017. Aortic aneurysm NOS (ICD10-I71.9). Electronically Signed   By: Rogelia Myers M.D.   On: 09/16/2023 13:42   CT Cervical Spine Wo Contrast Result Date: 09/16/2023 CLINICAL DATA:  Fall EXAM: CT CERVICAL SPINE WITHOUT CONTRAST TECHNIQUE: Multidetector CT imaging of the cervical spine was performed without intravenous contrast. Multiplanar CT image reconstructions were also generated. RADIATION DOSE REDUCTION: This exam was performed according to the departmental dose-optimization program which includes automated exposure control, adjustment of the mA and/or kV according to patient size and/or use of iterative reconstruction technique. COMPARISON:  06/30/2023 FINDINGS: Alignment: Slight degenerative anterolisthesis of C7 on T1, T1 on T2, T2 on T3, stable since prior study. Skull base and vertebrae: No acute fracture. No primary bone lesion or focal pathologic process. Soft tissues and spinal canal: No prevertebral fluid or swelling. No visible canal hematoma. Disc levels: Moderate multilevel degenerative disc and facet disease. No visible disc herniation. Upper chest: No acute findings Other: None IMPRESSION: Degenerative disc and facet disease. No acute bony abnormality. Electronically Signed   By: Franky Crease M.D.   On: 09/16/2023 13:38   CT T-SPINE NO CHARGE Result Date: 09/16/2023 CLINICAL DATA:  Back pain following a fall this morning. Multiple recent falls. EXAM: CT T SPINE W/O CM TECHNIQUE: Multidetector CT images of the thoracic were  obtained using the standard protocol without intravenous contrast. RADIATION DOSE REDUCTION: This exam was performed according to the departmental dose-optimization program which includes automated exposure control, adjustment of the mA and/or kV according to patient size and/or use of iterative reconstruction technique. COMPARISON:  Thoracic spine radiographs dated 04/11/2020 FINDINGS: Alignment: Mild grade 1 anterolisthesis at the C7-T1, T1-2 and T2-3 levels. Vertebrae: No acute fractures. Stable old T12 superior endplate compression deformity without acute fracture lines. Paraspinal and other soft tissues: Unremarkable. Disc levels: Multilevel degenerative changes with no disc protrusions. These include facet degenerative changes in the upper thoracic spine at the levels of subluxations described above. IMPRESSION: 1. No acute fracture or traumatic subluxation. 2. Multilevel degenerative changes. Electronically Signed  By: Elspeth Bathe M.D.   On: 09/16/2023 13:37   CT Head Wo Contrast Result Date: 09/16/2023 CLINICAL DATA:  Polytrauma, blunt.  Fall. EXAM: CT HEAD WITHOUT CONTRAST TECHNIQUE: Contiguous axial images were obtained from the base of the skull through the vertex without intravenous contrast. RADIATION DOSE REDUCTION: This exam was performed according to the departmental dose-optimization program which includes automated exposure control, adjustment of the mA and/or kV according to patient size and/or use of iterative reconstruction technique. COMPARISON:  06/30/2023 FINDINGS: Brain: There is atrophy and chronic small vessel disease changes. No acute intracranial abnormality. Specifically, no hemorrhage, hydrocephalus, mass lesion, acute infarction, or significant intracranial injury. Vascular: No hyperdense vessel or unexpected calcification. Skull: No acute calvarial abnormality. Soft tissue swelling over the left posterior parietal region. Sinuses/Orbits: No acute findings Other: None IMPRESSION:  Atrophy, chronic microvascular disease. No acute intracranial abnormality. Electronically Signed   By: Franky Crease M.D.   On: 09/16/2023 13:36     Procedures   Medications Ordered in the ED  ketorolac  (TORADOL ) 30 MG/ML injection 30 mg (30 mg Intramuscular Given 09/16/23 1202)                                    Medical Decision Making Amount and/or Complexity of Data Reviewed Labs: ordered. Radiology: ordered.  Risk Prescription drug management.   Patient is here with reported mechanical fall at home and now pain in her sternal chest.  She also struck her head on the ground.  CT imaging was ordered to evaluate for intracranial injury and potential sternal fracture.  I reviewed her imaging, notable for closed sternal fracture, minimally displaced  Patient is a borderline hypoxia after scanning.  She did not drop below 88%.  She reports he does have a history of COPD but does not feel short of breath.  She is not wheezing on exam.  IM Toradol  was given for pain.  Dermabond was applied to the small laceration on her left arm.  Patient remained with some discomfort after Toradol .  At this point I had discussed and recommended medical admission to the hospital given her discomfort and borderline hypoxia.  She refused admission, citing concerns for needing to care for her animals at home, and wanting to go home.   She told me that she has taken tramadol and oxycodone  in the past with no adverse interactions.  I explained that these medications can put her at risk for drowsiness, constipation and falls, but at this point she is needing stronger pain medicine to help control her chest pain.  We discussed using the incentive spirometer as an exercise at home to prevent pneumonia.  She also clarified that she can have a family member or neighbor checking on her daily, and that she will be using her walker at home.  Given her support, I think it is reasonable to discharge the patient with expected  home management, as she would otherwise be admitted primarily for pain control in the hospital.  Her white blood cell count is elevated without clear etiology but she does not have symptoms of infection.  No evidence of pneumonia seen on her chest x-ray, she has no GI or abdominal complaints, and does not have dysuria issues.  There would be no nidus or source of sepsis or bacteremia.  This may be an incidental finding and I recommend she follow-up with her doctor for this.  Final diagnoses:  Closed fracture of sternum, unspecified portion of sternum, initial encounter  Leukocytosis, unspecified type    ED Discharge Orders          Ordered    oxyCODONE  (ROXICODONE ) 5 MG immediate release tablet  Every 8 hours PRN        09/16/23 1405               Cottie Donnice PARAS, MD 09/16/23 1524

## 2023-10-18 ENCOUNTER — Ambulatory Visit

## 2023-11-10 ENCOUNTER — Ambulatory Visit
Admission: EM | Admit: 2023-11-10 | Discharge: 2023-11-10 | Disposition: A | Discharge status: Home / Self Care | Attending: Physician Assistant | Admitting: Physician Assistant

## 2023-11-10 ENCOUNTER — Other Ambulatory Visit: Payer: Self-pay

## 2023-11-10 DIAGNOSIS — U071 COVID-19: Secondary | ICD-10-CM

## 2023-11-10 DIAGNOSIS — J029 Acute pharyngitis, unspecified: Secondary | ICD-10-CM

## 2023-11-10 LAB — POC SOFIA SARS ANTIGEN FIA: SARS Coronavirus 2 Ag: POSITIVE — AB

## 2023-11-10 LAB — POCT RAPID STREP A (OFFICE): Rapid Strep A Screen: NEGATIVE

## 2023-11-10 MED ORDER — BENZONATATE 100 MG PO CAPS: 100.0000 mg | ORAL_CAPSULE | Freq: Three times a day (TID) | ORAL | 0 refills | Status: DC

## 2023-11-10 NOTE — Discharge Instructions (Addendum)
 VISIT SUMMARY:  You came in today because of a sore throat and dry cough that started after your recent sternum repair surgery. You were concerned about the persistent sore throat and wanted to get it checked while you had transportation available. You also mentioned a dry, hacking cough that initially involved mucus but has since resolved. You have a history of asthma, COPD, and ulcerative colitis.  YOUR PLAN:  -COVID-19 INFECTION: You have a confirmed COVID-19 infection, which is causing your sore throat and dry cough. Since your symptoms have lasted more than five days, antiviral medications are not recommended. You should continue managing your symptoms with Mucinex  and Tylenol , use Flonase  for congestion, and we have sent in a refill of Tessalon Perles for your cough. Additionally, continue saltwater gargles and consider using chloraseptic throat spray for throat discomfort. Monitor for any signs of worsening condition such as increased shortness of breath, wheezing, fatigue, fever, chills, confusion, or loss of consciousness.  -ASTHMA AND CHRONIC OBSTRUCTIVE PULMONARY DISEASE (COPD): Your asthma and COPD are causing wheezing, but your air movement is adequate. Make sure to use your inhalers as directed, including your rescue inhaler if needed.   INSTRUCTIONS:  Please monitor your symptoms closely and seek medical attention if you experience increased shortness of breath, wheezing, fatigue, fever, chills, confusion, or loss of consciousness. Refill your Tessalon Perles for cough management and continue using your inhalers as directed. Follow up with your primary care provider if your symptoms do not improve or if you have any concerns.

## 2023-11-10 NOTE — ED Provider Notes (Signed)
 [facility]    CSN: [id number] Arrival date & time: 11/10/23  1024      History   Chief Complaint Chief Complaint  Patient presents with   Sore Throat    HPI Tasha Hunt is a 78 y.o. female.  has a past medical history of Allergic rhinitis, cause unspecified, Aneurysm of splenic artery, Anxiety, Arthritis, Asthma, Complication of anesthesia, COPD (chronic obstructive pulmonary disease) (HCC), Depressive disorder, not elsewhere classified, Dyspnea, Esophageal reflux, H/O hiatal hernia, Hypertension, Obesity, unspecified, Pneumonia, PONV (postoperative nausea and vomiting), and Ulcerative colitis (HCC).   HPI  Discussed the use of AI scribe software for clinical note transcription with the patient, who gave verbal consent to proceed.  The patient, with asthma and COPD, presents with a sore throat and dry cough following recent sternum repair surgery.  She has experienced a very sore throat since her sternum repair surgery on November 04, 2023. During the procedure, a tube was placed down her throat, and it has not improved. She is concerned about the persistent sore throat and sought medical attention while she has transportation available. She mentions that her sore throat may have started before her hospital admission.  She also reports a dry, hacking cough. Initially, she was coughing up mucus, but this has since resolved. She has been using Tessalon Perles, which help with the cough, but she has only one left and uses it sparingly at night. She previously used Mucinex  for two to three days when she had congestion, which was effective at the time.  No shortness of breath, wheezing, nasal congestion, runny nose, ear pain, nausea, vomiting, diarrhea, fever, chills, rashes, body aches, headaches, or dizziness. Her past medical history includes asthma, COPD, and ulcerative colitis. She uses inhalers for her respiratory conditions and avoids NSAIDs due to her  ulcerative colitis.   Past Medical History:  Diagnosis Date   Allergic rhinitis, cause unspecified    Aneurysm of splenic artery    noted since 2002   Anxiety    Arthritis    hands   Asthma    Complication of anesthesia    COPD (chronic obstructive pulmonary disease) (HCC)    Depressive disorder, not elsewhere classified    Dyspnea    Esophageal reflux    H/O hiatal hernia    Hypertension    Obesity, unspecified    Pneumonia    03/27/11 many many years ago   PONV (postoperative nausea and vomiting)    Ulcerative colitis (HCC)     Patient Active Problem List   Diagnosis Date Noted   SDH (subdural hematoma) (HCC) 07/28/2022   S/P shoulder replacement, right 11/17/2019   S/P right TKA 04/20/2016   Aneurysm of splenic artery 03/19/2011    Past Surgical History:  Procedure Laterality Date   ABDOMINAL HYSTERECTOMY  1980   ARTERIAL ANEURYSM REPAIR  03/27/2011   splenic artery   BREAST BIOPSY  2003   right   BREAST BIOPSY  2012   left   BREAST LUMPECTOMY WITH RADIOACTIVE SEED LOCALIZATION Right 07/06/2019   Procedure: RIGHT BREAST LUMPECTOMY WITH RADIOACTIVE SEED LOCALIZATION;  Surgeon: Vanderbilt Ned, MD;  [facility]:  [facility];  Service: General;  Laterality: Right;   CATARACT EXTRACTION W/ INTRAOCULAR LENS IMPLANT Bilateral    CHOLECYSTECTOMY  2002   EMBOLIZATION N/A 03/27/2011   Procedure: EMBOLIZATION;  Surgeon: Carlin FORBES Haddock, MD;  Location: [facility] CATH LAB;  Service: Cardiovascular;  Laterality: N/A;   HERNIA REPAIR     KNEE CLOSED  REDUCTION Left 10/24/2021   Procedure: CLOSED MANIPULATION KNEE;  Surgeon: Ernie Cough, MD;  Location: [facility];  Service: Orthopedics;  Laterality: Left;   LAPAROSCOPIC NISSEN FUNDOPLICATION  2002   NASAL SEPTUM SURGERY  1985   deviated septum   REVERSE SHOULDER ARTHROPLASTY Right 11/17/2019   Procedure: REVERSE SHOULDER ARTHROPLASTY;  Surgeon: Kay Kemps, MD;  Location: [facility];  Service: Orthopedics;  Laterality:  Right;  interscalene block   TONSILLECTOMY  1948   TOTAL KNEE ARTHROPLASTY Right 04/20/2016   Procedure: RIGHT TOTAL KNEE ARTHROPLASTY;  Surgeon: Cough Ernie, MD;  Location: [facility];  Service: Orthopedics;  Laterality: Right;   TOTAL KNEE ARTHROPLASTY Left 06/19/2021    OB History   No obstetric history on file.      Home Medications    Prior to Admission medications   Medication Sig Start Date End Date Taking? Authorizing Provider  benzonatate (TESSALON) 100 MG capsule Take 1 capsule (100 mg total) by mouth every 8 (eight) hours. 11/10/23  Yes Torrance Stockley E, PA-C  acetaminophen  (TYLENOL ) 325 MG tablet Take 650 mg by mouth every 6 (six) hours as needed for moderate pain or headache.    [provider]  albuterol  (VENTOLIN  HFA) 108 (90 Base) MCG/ACT inhaler Inhale 1 puff into the lungs every 6 (six) hours as needed for wheezing or shortness of breath.    [provider]  ALPRAZolam  (XANAX ) 0.25 MG tablet Take 0.25 mg by mouth 2 (two) times daily as needed for anxiety.     [provider]  atorvastatin  (LIPITOR) 20 MG tablet Take 20 mg by mouth daily.    [provider]  Camphor-Menthol -Methyl Sal (SALONPAS) 3.01-25-08 % PTCH Apply 1 patch topically daily.    [provider]  cetirizine (ZYRTEC) 10 MG tablet Take 10 mg by mouth daily.    [provider]  Cholecalciferol  (DIALYVITE VITAMIN D  5000) 125 MCG (5000 UT) capsule Take 5,000 Units by mouth daily.    [provider]  cyclobenzaprine  (FLEXERIL ) 10 MG tablet Take 0.5 tablets (5 mg total) by mouth 2 (two) times daily as needed for muscle spasms. 11/12/22   Kingsley, Victoria K, DO  DILT-XR 180 MG 24 hr capsule Take 180 mg by mouth daily.  01/21/15   [provider]  DULoxetine  (CYMBALTA ) 60 MG capsule Take 60 mg by mouth daily.    [provider]  fluticasone  (FLONASE ) 50 MCG/ACT nasal spray Place 2 sprays into the nose daily.    [provider]   Fluticasone  Furoate (ARNUITY ELLIPTA ) 100 MCG/ACT AEPB Inhale 1 Dose into the lungs daily. 02/12/15   Kozlow, Camellia PARAS, MD  folic acid  (FOLVITE ) 1 MG tablet Take 1 mg by mouth daily.    [provider]  hydrochlorothiazide  (HYDRODIURIL ) 12.5 MG tablet Take 12.5 mg by mouth daily.  02/09/15   [provider]  HYDROcodone -acetaminophen  (NORCO/VICODIN) 5-325 MG tablet Take 1-2 tablets by mouth every 6 (six) hours as needed for moderate pain. 07/31/22   Bergman, Meghan D, NP  ketoconazole  (NIZORAL ) 2 % cream Apply 1 application topically daily as needed (fungus).    [provider]  Misc Natural Products (OSTEO BI-FLEX ADV TRIPLE ST PO) Take 2 tablets by mouth daily.    [provider]  oxyCODONE  (ROXICODONE ) 5 MG immediate release tablet Take 1 tablet (5 mg total) by mouth every 8 (eight) hours as needed for up to 15 doses for severe pain (pain score 7-10). 09/16/23   Trifan, Cough PARAS, MD  Polyethyl Glycol-Propyl Glycol (SYSTANE) 0.4-0.3 % SOLN Place 1 drop into both eyes 4 (four) times daily as needed (for dry eyes).    [provider]  promethazine  (PHENERGAN ) 12.5 MG tablet Take 1 tablet (12.5 mg total) by mouth every 6 (six) hours as needed for nausea or vomiting. 07/31/22   Bergman, Meghan D, NP  sulfaSALAzine  (AZULFIDINE ) 500 MG tablet Take 1,000 mg by mouth 2 (two) times daily. 09/24/21   [provider]  traMADol (ULTRAM) 50 MG tablet Take 50 mg by mouth every 6 (six) hours as needed for moderate pain. Use as needed for moderate pain.    [provider]    Family History Family History  Problem Relation Age of Onset   Cancer Mother        melanoma   Diabetes Mother    Heart disease Mother    Hyperlipidemia Mother    Diabetes Father    Hypertension Father    Cancer Father        prostate   Heart disease Father    Heart attack Father    Cancer Paternal Grandmother    Cancer Paternal Aunt    Aneurysm Paternal Aunt        AAA    Aneurysm Paternal Uncle        AAA    Social History Social History   Tobacco Use   Smoking status: Never   Smokeless tobacco: Never  Vaping Use   Vaping status: Never Used  Substance Use Topics   Alcohol  use: No   Drug use: No     Allergies   Morphine  and codeine   Review of Systems Review of Systems  Constitutional:  Negative for chills and fever.  HENT:  Positive for sore throat. Negative for congestion, ear pain and rhinorrhea.   Respiratory:  Positive for cough. Negative for shortness of breath and wheezing.   Gastrointestinal:  Negative for diarrhea, nausea and vomiting.  Musculoskeletal:  Negative for myalgias.  Skin:  Negative for rash.  Neurological:  Negative for dizziness, light-headedness and headaches.     Physical Exam Triage Vital Signs ED Triage Vitals  Encounter Vitals Group     BP 11/10/23 1035 133/81     Girls Systolic BP Percentile --      Girls Diastolic BP Percentile --      Boys Systolic BP Percentile --      Boys Diastolic BP Percentile --      Pulse Rate 11/10/23 1035 (!) 102     Resp 11/10/23 1035 18     Temp 11/10/23 1035 97.8 F (36.6 C)     Temp Source 11/10/23 1035 Oral     SpO2 11/10/23 1035 93 %     Weight 11/10/23 1035 180 lb (81.6 kg)     Height 11/10/23 1035 5' 1 (1.549 m)     Head Circumference --      Peak Flow --      Pain Score 11/10/23 1100 6     Pain Loc --      Pain Education --      Exclude from Growth Chart --    No data found.  Updated Vital Signs BP 133/81 (BP Location: Right Arm)   Pulse (!) 102   Temp 97.8 F (36.6 C) (Oral)   Resp 18   Ht 5' 1 (1.549 m)   Wt 180 lb (81.6 kg)   SpO2 93% Comment: ear  BMI 34.01 kg/m   Visual Acuity Right  Eye Distance:   Left Eye Distance:   Bilateral Distance:    Right Eye Near:   Left Eye Near:    Bilateral Near:     Physical Exam Vitals reviewed.  Constitutional:      General: She is awake.     Appearance: Normal appearance. She is well-developed  and well-groomed.  HENT:     Head: Normocephalic and atraumatic.     Right Ear: Hearing, tympanic membrane and ear canal normal.     Left Ear: Hearing, tympanic membrane and ear canal normal.     Mouth/Throat:     Lips: Pink.     Mouth: Mucous membranes are moist.     Pharynx: Oropharynx is clear. Uvula midline. No pharyngeal swelling, oropharyngeal exudate, posterior oropharyngeal erythema, uvula swelling or postnasal drip.  Cardiovascular:     Rate and Rhythm: Normal rate and regular rhythm.     Pulses: Normal pulses.          Radial pulses are 2+ on the right side and 2+ on the left side.     Heart sounds: Normal heart sounds. No murmur heard.    No friction rub. No gallop.  Pulmonary:     Effort: Pulmonary effort is normal. No tachypnea, accessory muscle usage, prolonged expiration, respiratory distress or retractions.     Breath sounds: No decreased air movement. Wheezing present. No decreased breath sounds, rhonchi or rales.  Musculoskeletal:     Cervical back: Normal range of motion and neck supple.  Lymphadenopathy:     Head:     Right side of head: No submental, submandibular or preauricular adenopathy.     Left side of head: No submental, submandibular or preauricular adenopathy.     Cervical:     Right cervical: No superficial cervical adenopathy.    Left cervical: No superficial cervical adenopathy.     Upper Body:     Right upper body: No supraclavicular adenopathy.     Left upper body: No supraclavicular adenopathy.  Neurological:     Mental Status: She is alert.  Psychiatric:        Behavior: Behavior is cooperative.      UC Treatments / Results  Labs (all labs ordered are listed, but only abnormal results are displayed) Labs Reviewed  POC SOFIA SARS ANTIGEN FIA - Abnormal; Notable for the following components:      Result Value   SARS Coronavirus 2 Ag Positive (*)    All other components within normal limits  POCT RAPID STREP A (OFFICE)     EKG   Radiology No results found.  Procedures Procedures (including critical care time)  Medications Ordered in UC Medications - No data to display  Initial Impression / Assessment and Plan / UC Course  I have reviewed the triage vital signs and the nursing notes.  Pertinent labs & imaging results that were available during my care of the patient were reviewed by me and considered in my medical decision making (see chart for details).      Final Clinical Impressions(s) / UC Diagnoses   Final diagnoses:  Sore throat  COVID-19   COVID-19 infection Confirmed with positive test. Symptoms include sore throat and dry cough, likely present around the time of surgery or slightly before. Strep test negative. Symptoms have persisted for more than five days, indicating antiviral medications are not recommended. Likely at the tail end of the infection. - Continue symptomatic management with Mucinex  and Tylenol . - Use Flonase  for congestion. - Refill  Tessalon Perles for cough management. - Continue saltwater gargles and consider chloraseptic throat spray for throat discomfort. - Monitor for signs of worsening condition such as increased shortness of breath, wheezing, fatigue, fever, chills, confusion, or loss of consciousness. ED and return precautions reviewed and provided in AVS. Follow up as needed for persistent or progressing symptoms    Asthma and chronic obstructive pulmonary disease (COPD) Wheezing noted on examination, consistent with asthma and COPD. Air movement adequate to the bases. - Ensure use of inhalers as directed, including rescue inhaler as needed.    Discharge Instructions      VISIT SUMMARY:  You came in today because of a sore throat and dry cough that started after your recent sternum repair surgery. You were concerned about the persistent sore throat and wanted to get it checked while you had transportation available. You also mentioned a dry, hacking  cough that initially involved mucus but has since resolved. You have a history of asthma, COPD, and ulcerative colitis.  YOUR PLAN:  -COVID-19 INFECTION: You have a confirmed COVID-19 infection, which is causing your sore throat and dry cough. Since your symptoms have lasted more than five days, antiviral medications are not recommended. You should continue managing your symptoms with Mucinex  and Tylenol , use Flonase  for congestion, and we have sent in a refill of Tessalon Perles for your cough. Additionally, continue saltwater gargles and consider using chloraseptic throat spray for throat discomfort. Monitor for any signs of worsening condition such as increased shortness of breath, wheezing, fatigue, fever, chills, confusion, or loss of consciousness.  -ASTHMA AND CHRONIC OBSTRUCTIVE PULMONARY DISEASE (COPD): Your asthma and COPD are causing wheezing, but your air movement is adequate. Make sure to use your inhalers as directed, including your rescue inhaler if needed.   INSTRUCTIONS:  Please monitor your symptoms closely and seek medical attention if you experience increased shortness of breath, wheezing, fatigue, fever, chills, confusion, or loss of consciousness. Refill your Tessalon Perles for cough management and continue using your inhalers as directed. Follow up with your primary care provider if your symptoms do not improve or if you have any concerns.     ED Prescriptions     Medication Sig Dispense Auth. Provider   benzonatate (TESSALON) 100 MG capsule Take 1 capsule (100 mg total) by mouth every 8 (eight) hours. 30 capsule Kelleigh Skerritt E, PA-C      PDMP not reviewed this encounter.   Marylene Rocky BRAVO, PA-C 11/10/23 1357

## 2023-11-10 NOTE — ED Triage Notes (Signed)
 Pt presents with a chief complaint of sore throat x 1 week. Sore throat is accompanied with cough and chest tightness. Recent sx on 10/16. Was put under anesthesia. Although had a little sore throat prior to surgery. Currently rates pain a 6/10. Did take Mucinex  + Tessalon Pearls at home. Requesting a refill on Tessalon Pearls. States they have been helpful. Only has one left.

## 2023-11-18 ENCOUNTER — Ambulatory Visit

## 2023-12-22 ENCOUNTER — Ambulatory Visit

## 2024-01-20 ENCOUNTER — Ambulatory Visit: Admitting: Radiology

## 2024-01-20 ENCOUNTER — Other Ambulatory Visit: Payer: Self-pay

## 2024-01-20 ENCOUNTER — Ambulatory Visit
Admission: EM | Admit: 2024-01-20 | Discharge: 2024-01-20 | Disposition: A | Discharge status: Home / Self Care | Source: Home / Self Care

## 2024-01-20 DIAGNOSIS — M79661 Pain in right lower leg: Secondary | ICD-10-CM

## 2024-01-20 NOTE — ED Triage Notes (Signed)
 Pt presents with a chief complaint of fall two days ago. States she was trying to zip up a suitcase and had bedroom slippers on. Started to slip and fell to the floor. Denies head injury. Voicing pain in right lower leg. Currently rates overall pain a 9/10. OTC Tylenol  taken at home. Also applying ice + heat to area. Minimal to no improvement. Takes baby aspirin  daily.

## 2024-01-20 NOTE — ED Provider Notes (Signed)
 " [facility]    CSN: [id number] Arrival date & time: 01/20/24  1236      History   Chief Complaint Chief Complaint  Patient presents with   Fall    HPI Tasha Hunt is a 79 y.o. female.   HPI Pt is here today for concerns of right leg pain after a fall that occurred a few days ago. She states when she realized she was falling she tried to sit down and this seemed to prevent a worse outcome but caused her leg to be twisted and she had difficulty straightening it out.  She states she has had several falls over the last 12-18 months resulting in fractures.  She denies hitting her head, LOC, confusion.  She reports an achy pain just below the right knee while sitting and resting but when she tries to get up or walks on it, it becomes very painful and almost causes her to yell out.  Interventions: Tylenol , she cannot take NSAIDs due to ulcerative colitis.    Past Medical History:  Diagnosis Date   Allergic rhinitis, cause unspecified    Aneurysm of splenic artery    noted since 2002   Anxiety    Arthritis    hands   Asthma    Complication of anesthesia    COPD (chronic obstructive pulmonary disease) (HCC)    Depressive disorder, not elsewhere classified    Dyspnea    Esophageal reflux    H/O hiatal hernia    Hypertension    Obesity, unspecified    Pneumonia    03/27/11 many many years ago   PONV (postoperative nausea and vomiting)    Ulcerative colitis (HCC)     Patient Active Problem List   Diagnosis Date Noted   SDH (subdural hematoma) (HCC) 07/28/2022   S/P shoulder replacement, right 11/17/2019   S/P right TKA 04/20/2016   Aneurysm of splenic artery 03/19/2011    Past Surgical History:  Procedure Laterality Date   ABDOMINAL HYSTERECTOMY  1980   ARTERIAL ANEURYSM REPAIR  03/27/2011   splenic artery   BREAST BIOPSY  2003   right   BREAST BIOPSY  2012   left   BREAST LUMPECTOMY WITH RADIOACTIVE SEED LOCALIZATION Right 07/06/2019    Procedure: RIGHT BREAST LUMPECTOMY WITH RADIOACTIVE SEED LOCALIZATION;  Surgeon: Vanderbilt Ned, MD;  Location: [facility];  Service: General;  Laterality: Right;   CATARACT EXTRACTION W/ INTRAOCULAR LENS IMPLANT Bilateral    CHOLECYSTECTOMY  2002   EMBOLIZATION N/A 03/27/2011   Procedure: EMBOLIZATION;  Surgeon: Carlin FORBES Haddock, MD;  Location: [facility] CATH LAB;  Service: Cardiovascular;  Laterality: N/A;   HERNIA REPAIR     KNEE CLOSED REDUCTION Left 10/24/2021   Procedure: CLOSED MANIPULATION KNEE;  Surgeon: Ernie Cough, MD;  Location: [facility];  Service: Orthopedics;  Laterality: Left;   LAPAROSCOPIC NISSEN FUNDOPLICATION  2002   NASAL SEPTUM SURGERY  1985   deviated septum   REVERSE SHOULDER ARTHROPLASTY Right 11/17/2019   Procedure: REVERSE SHOULDER ARTHROPLASTY;  Surgeon: Kay Kemps, MD;  Location: [facility];  Service: Orthopedics;  Laterality: Right;  interscalene block   TONSILLECTOMY  1948   TOTAL KNEE ARTHROPLASTY Right 04/20/2016   Procedure: RIGHT TOTAL KNEE ARTHROPLASTY;  Surgeon: Cough Ernie, MD;  Location: [facility];  Service: Orthopedics;  Laterality: Right;   TOTAL KNEE ARTHROPLASTY Left 06/19/2021    OB History   No obstetric history on file.      Home Medications  Prior to Admission medications  Medication Sig Start Date End Date Taking? Authorizing Provider  aspirin  EC 81 MG tablet Take 81 mg by mouth daily. Swallow whole.   Yes [provider]  acetaminophen  (TYLENOL ) 325 MG tablet Take 650 mg by mouth every 6 (six) hours as needed for moderate pain or headache.    [provider]  albuterol  (VENTOLIN  HFA) 108 (90 Base) MCG/ACT inhaler Inhale 1 puff into the lungs every 6 (six) hours as needed for wheezing or shortness of breath.    [provider]  ALPRAZolam  (XANAX ) 0.25 MG tablet Take 0.25 mg by mouth 2 (two) times daily as needed for anxiety.     [provider]  atorvastatin  (LIPITOR) 20 MG tablet Take 20 mg by  mouth daily.    [provider]  benzonatate  (TESSALON ) 100 MG capsule Take 1 capsule (100 mg total) by mouth every 8 (eight) hours. 11/10/23   Malonie Tatum E, PA-C  Camphor-Menthol -Methyl Sal (SALONPAS) 3.01-25-08 % PTCH Apply 1 patch topically daily.    [provider]  cetirizine (ZYRTEC) 10 MG tablet Take 10 mg by mouth daily.    [provider]  Cholecalciferol  (DIALYVITE VITAMIN D  5000) 125 MCG (5000 UT) capsule Take 5,000 Units by mouth daily.    [provider]  cyclobenzaprine  (FLEXERIL ) 10 MG tablet Take 0.5 tablets (5 mg total) by mouth 2 (two) times daily as needed for muscle spasms. 11/12/22   Kingsley, Victoria K, DO  DILT-XR 180 MG 24 hr capsule Take 180 mg by mouth daily.  01/21/15   [provider]  DULoxetine  (CYMBALTA ) 60 MG capsule Take 60 mg by mouth daily.    [provider]  fluticasone  (FLONASE ) 50 MCG/ACT nasal spray Place 2 sprays into the nose daily.    [provider]  Fluticasone  Furoate (ARNUITY ELLIPTA ) 100 MCG/ACT AEPB Inhale 1 Dose into the lungs daily. 02/12/15   Kozlow, Camellia PARAS, MD  folic acid  (FOLVITE ) 1 MG tablet Take 1 mg by mouth daily.    [provider]  hydrochlorothiazide  (HYDRODIURIL ) 12.5 MG tablet Take 12.5 mg by mouth daily.  02/09/15   [provider]  HYDROcodone -acetaminophen  (NORCO/VICODIN) 5-325 MG tablet Take 1-2 tablets by mouth every 6 (six) hours as needed for moderate pain. 07/31/22   Bergman, Meghan D, NP  ketoconazole  (NIZORAL ) 2 % cream Apply 1 application topically daily as needed (fungus).    [provider]  Misc Natural Products (OSTEO BI-FLEX ADV TRIPLE ST PO) Take 2 tablets by mouth daily.    [provider]  oxyCODONE  (ROXICODONE ) 5 MG immediate release tablet Take 1 tablet (5 mg total) by mouth every 8 (eight) hours as needed for up to 15 doses for severe pain (pain score 7-10). 09/16/23   Trifan, Donnice PARAS, MD  Polyethyl Glycol-Propyl Glycol  (SYSTANE) 0.4-0.3 % SOLN Place 1 drop into both eyes 4 (four) times daily as needed (for dry eyes).    [provider]  promethazine  (PHENERGAN ) 12.5 MG tablet Take 1 tablet (12.5 mg total) by mouth every 6 (six) hours as needed for nausea or vomiting. 07/31/22   Bergman, Meghan D, NP  sulfaSALAzine  (AZULFIDINE ) 500 MG tablet Take 1,000 mg by mouth 2 (two) times daily. 09/24/21   [provider]  traMADol (ULTRAM) 50 MG tablet Take 50 mg by mouth every 6 (six) hours as needed for moderate pain. Use as needed for moderate pain.    [provider]    Family History  Family History  Problem Relation Age of Onset   Cancer Mother        melanoma   Diabetes Mother    Heart disease Mother    Hyperlipidemia Mother    Diabetes Father    Hypertension Father    Cancer Father        prostate   Heart disease Father    Heart attack Father    Cancer Paternal Grandmother    Cancer Paternal Aunt    Aneurysm Paternal Aunt        AAA   Aneurysm Paternal Uncle        AAA    Social History Social History[1]   Allergies   Morphine  and codeine   Review of Systems Review of Systems  Musculoskeletal:  Positive for arthralgias and myalgias.     Physical Exam Triage Vital Signs ED Triage Vitals  Encounter Vitals Group     BP 01/20/24 1259 (!) 145/71     Girls Systolic BP Percentile --      Girls Diastolic BP Percentile --      Boys Systolic BP Percentile --      Boys Diastolic BP Percentile --      Pulse Rate 01/20/24 1259 87     Resp 01/20/24 1259 18     Temp 01/20/24 1259 97.9 F (36.6 C)     Temp Source 01/20/24 1259 Oral     SpO2 01/20/24 1259 93 %     Weight 01/20/24 1259 180 lb (81.6 kg)     Height 01/20/24 1259 5' 1 (1.549 m)     Head Circumference --      Peak Flow --      Pain Score 01/20/24 1314 9     Pain Loc --      Pain Education --      Exclude from Growth Chart --    No data found.  Updated Vital Signs BP (!) 145/71 (BP Location: Right  Arm)   Pulse 87   Temp 97.9 F (36.6 C) (Oral)   Resp 18   Ht 5' 1 (1.549 m)   Wt 180 lb (81.6 kg)   SpO2 93%   BMI 34.01 kg/m   Visual Acuity Right Eye Distance:   Left Eye Distance:   Bilateral Distance:    Right Eye Near:   Left Eye Near:    Bilateral Near:     Physical Exam Vitals reviewed.  Constitutional:      General: She is awake.     Appearance: Normal appearance. She is well-developed and well-groomed.  HENT:     Head: Normocephalic and atraumatic.  Eyes:     General: Lids are normal. Gaze aligned appropriately.     Extraocular Movements: Extraocular movements intact.     Conjunctiva/sclera: Conjunctivae normal.  Pulmonary:     Effort: Pulmonary effort is normal.  Musculoskeletal:     Right hip: Normal strength.     Left hip: Normal strength.     Comments: Patient has hip flexion strength 5/5 bilaterally Patient is able to flex and extend both knees symmetrically but states that hyperflexing the left knee causes more pain along the anterior aspect.  She does have notable swelling of the upper aspect of the right lower leg. Right calf circumference is 18 inches.  Left calf circumference is 15 inches. No obvious bruising, discoloration, erythema of the right lower extremity.  No obvious exquisite tenderness with light palpation of the affected area Dorsalis pedis and posterior  tibialis pulses are 2+ bilaterally and brisk  Neurological:     Mental Status: She is alert and oriented to person, place, and time.  Psychiatric:        Attention and Perception: Attention and perception normal.        Mood and Affect: Mood and affect normal.        Speech: Speech normal.        Behavior: Behavior normal. Behavior is cooperative.        Thought Content: Thought content normal.        Judgment: Judgment normal.      UC Treatments / Results  Labs (all labs ordered are listed, but only abnormal results are displayed) Labs Reviewed - No data to  display  EKG   Radiology No results found.   Procedures Procedures (including critical care time)  Medications Ordered in UC Medications - No data to display  Initial Impression / Assessment and Plan / UC Course  I have reviewed the triage vital signs and the nursing notes.  Pertinent labs & imaging results that were available during my care of the patient were reviewed by me and considered in my medical decision making (see chart for details).      Final Clinical Impressions(s) / UC Diagnoses   Final diagnoses:  Pain in right lower leg   Patient is here today with concerns of right leg pain that occurred after fall.  She states that the fall happened a few days ago and she denies hitting her head, loss of consciousness or confusion.  She does report an achy pain just below the right knee while sitting and resting but significant pain occurs when she tries to stand on the leg or apply weight.  Physical exam is notable for swelling at the upper aspect of the right lower leg just below the knee.  Knee and ankle ROM appears intact.  Imaging is negative for signs of acute fracture or dislocation but does note subcutaneous edema.  At this time I suspect likely soft tissue injury based on the pattern of swelling.  Less suspicious for DVT given lack of erythema, tightness or distal edema.  For now recommend supportive care and monitoring for symptom worsening. Results of x-ray reviewed with patient prior to discharge.  Reviewed signs and symptoms, consistent with compartment syndrome as well as provided strict ER precautions.  Patient voices agreement and understanding.  Discharge Instructions   None    ED Prescriptions   None    PDMP not reviewed this encounter.      [1]  Social History Tobacco Use   Smoking status: Never   Smokeless tobacco: Never  Vaping Use   Vaping status: Never Used  Substance Use Topics   Alcohol  use: No   Drug use: No     Krisalyn Yankowski, [place] BRAVO,  PA-C 01/24/24 1105  "

## 2024-02-04 ENCOUNTER — Ambulatory Visit

## 2024-02-08 ENCOUNTER — Ambulatory Visit

## 2024-02-08 VITALS — BP 132/70 | HR 74 | Ht 61.0 in | Wt 119.0 lb

## 2024-02-08 DIAGNOSIS — M799 Soft tissue disorder, unspecified: Secondary | ICD-10-CM | POA: Diagnosis not present

## 2024-02-08 DIAGNOSIS — J45909 Unspecified asthma, uncomplicated: Secondary | ICD-10-CM | POA: Diagnosis not present

## 2024-02-08 DIAGNOSIS — R918 Other nonspecific abnormal finding of lung field: Secondary | ICD-10-CM | POA: Diagnosis not present

## 2024-02-08 DIAGNOSIS — S065XAA Traumatic subdural hemorrhage with loss of consciousness status unknown, initial encounter: Secondary | ICD-10-CM

## 2024-02-08 NOTE — Progress Notes (Signed)
 "   Subjective:   PATIENT ID: Tasha Hunt GENDER: female DOB: 01-26-1945, MRN: [id number]   HPI Discussed the use of AI scribe software for clinical note transcription with the patient, who gave verbal consent to proceed.  History of Present Illness Tasha Hunt is a 79 year old female with asthma who presents with concerns about small nodules found on a CT scan. She was referred by her primary care physician, Tinnie Salt, for evaluation of small nodules found on a CT scan.  She has a history of asthma; she reports being told these were diagnosed via chest x-ray and pulmonary function tests. She experiences occasional dyspnea, particularly when ambulating for extended periods, and uses albuterol  once or twice a month and Arnuity Ellipta  daily to manage her symptoms.  A recent CT scan revealed small nodules in her chest, measuring three millimeters, prompting her referral to the pulmonary specialist. She is concerned about the nodules.  She has a history of falls, one of which occurred a year and a half ago resulting in intracranial hemorrhage. Another fall this summer led to a sternal fracture, which required surgical intervention with a titanium plate. She reports her health has deteriorated and that she has limited ambulation.  She reports swelling in her right leg following a fall, persisting for about two weeks. X-rays showed no fracture, and there is no pain in the calf. She has not yet discussed this issue with her primary care physician.  No fever, chills, night sweats, hemoptysis, unintentional weight loss, or loss of appetite. She reports productive cough with mucus but no blood.     Past Medical History:  Diagnosis Date   Allergic rhinitis, cause unspecified    Aneurysm of splenic artery    noted since 2002   Anxiety    Arthritis    hands   Asthma    Complication of anesthesia    COPD (chronic obstructive pulmonary disease) (HCC)    Depressive  disorder, not elsewhere classified    Dyspnea    Esophageal reflux    H/O hiatal hernia    Hypertension    Obesity, unspecified    Pneumonia    03/27/11 many many years ago   PONV (postoperative nausea and vomiting)    Ulcerative colitis (HCC)      Family History  Problem Relation Age of Onset   Cancer Mother        melanoma   Diabetes Mother    Heart disease Mother    Hyperlipidemia Mother    Diabetes Father    Hypertension Father    Cancer Father        prostate   Heart disease Father    Heart attack Father    Cancer Paternal Grandmother    Cancer Paternal Aunt    Aneurysm Paternal Aunt        AAA   Aneurysm Paternal Uncle        AAA     Social History   Socioeconomic History   Marital status: Divorced    Spouse name: Not on file   Number of children: Not on file   Years of education: Not on file   Highest education level: Not on file  Occupational History   Not on file  Tobacco Use   Smoking status: Never    Passive exposure: Past   Smokeless tobacco: Never  Vaping Use   Vaping status: Never Used  Substance and Sexual Activity   Alcohol  use: No   Drug  use: No   Sexual activity: Never  Other Topics Concern   Not on file  Social History Narrative   Not on file   Social Drivers of Health   Tobacco Use: Low Risk (02/08/2024)   Patient History    Smoking Tobacco Use: Never    Smokeless Tobacco Use: Never    Passive Exposure: Past  Financial Resource Strain: Low Risk (04/19/2023)   Received from [facility]   Overall Financial Resource Strain (CARDIA)    Difficulty of Paying Living Expenses: Not hard at all  Food Insecurity: No Food Insecurity (11/04/2023)   Received from [facility]   Epic    Within the past 12 months, you worried that your food would run out before you got the money to buy more.: Never true    Within the past 12 months, the food you bought just didn't last and you didn't have money to get more.: Never true  Transportation  Needs: No Transportation Needs (11/04/2023)   Received from [facility]    In the past 12 months, has lack of transportation kept you from medical appointments or from getting medications?: No    In the past 12 months, has lack of transportation kept you from meetings, work, or from getting things needed for daily living?: No  Physical Activity: Insufficiently Active (04/19/2023)   Received from [facility]   Exercise Vital Sign    On average, how many days per week do you engage in moderate to strenuous exercise (like a brisk walk)?: 2 days    On average, how many minutes do you engage in exercise at this level?: 30 min  Stress: Stress Concern Present (11/04/2023)   Received from [facility] - Occupational Stress Questionnaire    Do you feel stress - tense, restless, nervous, or anxious, or unable to sleep at night because your mind is troubled all the time - these days?: Very much  Social Connections: Socially Integrated (04/19/2023)   Received from [facility]   Social Network    How would you rate your social network (family, work, friends)?: Good participation with social networks  Intimate Partner Violence: Not At Risk (11/04/2023)   Received from [facility]   HITS    Over the last 12 months how often did your partner physically hurt you?: Never    Over the last 12 months how often did your partner insult you or talk down to you?: Never    Over the last 12 months how often did your partner threaten you with physical harm?: Never    Over the last 12 months how often did your partner scream or curse at you?: Never  Depression (PHQ2-9): Not on file  Alcohol  Screen: Not on file  Housing: Low Risk (11/04/2023)   Received from [facility]    In the last 12 months, was there a time when you were not able to pay the mortgage or rent on time?: No    In the past 12 months, how many times have you moved where you were  living?: 0    At any time in the past 12 months, were you homeless or living in a shelter (including now)?: No  Utilities: Not At Risk (11/04/2023)   Received from [facility]    In the past 12 months has the electric, gas, oil, or water  company threatened to shut off services in your home?:  No  Health Literacy: Not on file     Allergies[1]   Outpatient Medications Prior to Visit  Medication Sig Dispense Refill   acetaminophen  (TYLENOL ) 325 MG tablet Take 650 mg by mouth every 6 (six) hours as needed for moderate pain or headache.     albuterol  (VENTOLIN  HFA) 108 (90 Base) MCG/ACT inhaler Inhale 1 puff into the lungs every 6 (six) hours as needed for wheezing or shortness of breath.     aspirin  EC 81 MG tablet Take 81 mg by mouth daily. Swallow whole.     atorvastatin  (LIPITOR) 20 MG tablet Take 20 mg by mouth daily.     Camphor-Menthol -Methyl Sal (SALONPAS) 3.01-25-08 % PTCH Apply 1 patch topically daily.     cetirizine (ZYRTEC) 10 MG tablet Take 10 mg by mouth daily.     Cholecalciferol  (DIALYVITE VITAMIN D  5000) 125 MCG (5000 UT) capsule Take 5,000 Units by mouth daily.     DILT-XR 180 MG 24 hr capsule Take 180 mg by mouth daily.      DULoxetine  (CYMBALTA ) 60 MG capsule Take 60 mg by mouth daily.     fluticasone  (FLONASE ) 50 MCG/ACT nasal spray Place 2 sprays into the nose daily.     Fluticasone  Furoate (ARNUITY ELLIPTA ) 100 MCG/ACT AEPB Inhale 1 Dose into the lungs daily. 3 each 1   folic acid  (FOLVITE ) 1 MG tablet Take 1 mg by mouth daily.     hydrochlorothiazide  (HYDRODIURIL ) 12.5 MG tablet Take 12.5 mg by mouth daily.      HYDROcodone -acetaminophen  (NORCO/VICODIN) 5-325 MG tablet Take 1-2 tablets by mouth every 6 (six) hours as needed for moderate pain. 30 tablet 0   Misc Natural Products (OSTEO BI-FLEX ADV TRIPLE ST PO) Take 2 tablets by mouth daily.     oxyCODONE  (ROXICODONE ) 5 MG immediate release tablet Take 1 tablet (5 mg total) by mouth every 8 (eight) hours as needed  for up to 15 doses for severe pain (pain score 7-10). 15 tablet 0   Polyethyl Glycol-Propyl Glycol (SYSTANE) 0.4-0.3 % SOLN Place 1 drop into both eyes 4 (four) times daily as needed (for dry eyes).     sulfaSALAzine  (AZULFIDINE ) 500 MG tablet Take 1,000 mg by mouth 2 (two) times daily.     traMADol (ULTRAM) 50 MG tablet Take 50 mg by mouth every 6 (six) hours as needed for moderate pain. Use as needed for moderate pain.     ALPRAZolam  (XANAX ) 0.25 MG tablet Take 0.25 mg by mouth 2 (two) times daily as needed for anxiety.  (Patient not taking: Reported on 02/08/2024)     benzonatate  (TESSALON ) 100 MG capsule Take 1 capsule (100 mg total) by mouth every 8 (eight) hours. 30 capsule 0   cyclobenzaprine  (FLEXERIL ) 10 MG tablet Take 0.5 tablets (5 mg total) by mouth 2 (two) times daily as needed for muscle spasms. 10 tablet 0   ketoconazole  (NIZORAL ) 2 % cream Apply 1 application topically daily as needed (fungus).     promethazine  (PHENERGAN ) 12.5 MG tablet Take 1 tablet (12.5 mg total) by mouth every 6 (six) hours as needed for nausea or vomiting. 20 tablet 0   No facility-administered medications prior to visit.    ROS Reviewed all systems and reported negative except as above     Objective:   Vitals:   02/08/24 1430  BP: 132/70  Pulse: 74  SpO2: 94%  Weight: 119 lb (54 kg)  Height: 5' 1 (1.549 m)    Physical Exam Physical Exam GENERAL:  Appropriate to age, no acute distress. HEAD EYES EARS NOSE THROAT: Moist mucous membranes, atraumatic, normocephalic. CHEST: Clear to auscultation bilaterally, no wheezing, no crackles, no rales. CARDIAC: Regular rate and rhythm, normal S1, normal S2, no murmurs, no rubs, no gallops. ABDOMEN: Soft, nontender. NEUROLOGICAL: Motor and sensation grossly intact, alert and oriented times X 3. EXTREMITIES: Warm, well perfused, right leg swollen.     CBC    Component Value Date/Time   WBC 18.7 (H) 09/16/2023 1317   RBC 4.81 09/16/2023 1317   HGB  15.1 (H) 09/16/2023 1317   HCT 44.2 09/16/2023 1317   PLT 223 09/16/2023 1317   MCV 91.9 09/16/2023 1317   MCH 31.4 09/16/2023 1317   MCHC 34.2 09/16/2023 1317   RDW 13.8 09/16/2023 1317   LYMPHSABS 2.0 07/03/2019 1013   MONOABS 1.4 (H) 07/03/2019 1013   EOSABS 0.8 (H) 07/03/2019 1013   BASOSABS 0.2 (H) 07/03/2019 1013     Results Radiology Chest CT: Multiple pulmonary nodules measuring 3 mm; no significant smoking-related pulmonary disease (Independently interpreted)    PFT:     No data to display               Assessment & Plan:   Assessment and Plan Assessment & Plan Asthma Well-controlled with infrequent symptoms. Pulmonary function tests confirm asthma with minimal COPD involvement. - Continue Arnuity Ellipta  daily. - Use albuterol  as needed, especially before physical therapy sessions. - Wash mouth after using Arnuity Ellipta .  Right lower extremity swelling after trauma Swelling likely due to trauma, not a clot. No current calf pain, reducing likelihood of deep vein thrombosis. - Elevate the right leg above heart level using pillows. - Consider using compression stockings if she fits. - Monitor for calf pain; if pain develops, consider ultrasound to rule out clot.  Incidental small pulmonary nodules 3 mm nodules identified on CT scan, not concerning. No symptoms suggestive of malignancy. - No further action required for nodules at this time.        Zola Herter, MD [facility] Office: [phone]       [1]  Allergies Allergen Reactions   Morphine  And Codeine Nausea And Vomiting   "

## 2024-02-08 NOTE — Patient Instructions (Signed)
" °  VISIT SUMMARY: During your visit, we discussed your asthma, recent right leg swelling, and the small nodules found on your CT scan. Your asthma is well-controlled with your current medications. The swelling in your right leg is likely due to trauma and not a blood clot. The small nodules in your chest are not concerning at this time.  YOUR PLAN: -ASTHMA: Asthma is a condition where your airways narrow and swell, producing extra mucus, which can make breathing difficult. Your asthma is well-controlled with your current medications. Continue using Arnuity Ellipta  daily and albuterol  as needed, especially before physical therapy sessions. Remember to wash your mouth after using Arnuity Ellipta .  -RIGHT LOWER EXTREMITY SWELLING AFTER TRAUMA: The swelling in your right leg is likely due to trauma from your recent fall and not a blood clot. Elevate your right leg above heart level using pillows, and consider using compression stockings if they fit. Monitor for any calf pain; if pain develops, we may need to perform an ultrasound to rule out a clot.  -INCIDENTAL SMALL PULMONARY NODULES: The small nodules found on your CT scan are not concerning and do not suggest cancer. No further action is required for the nodules at this time.  INSTRUCTIONS: Please continue with your current medications for asthma. Elevate your right leg and consider using compression stockings. Monitor for any new symptoms, especially calf pain. If you experience any new or worsening symptoms, please contact your primary care physician. Follow up with your primary care physician as needed.    Contains text generated by [employer].   "

## 2024-02-23 ENCOUNTER — Ambulatory Visit

## 2024-03-02 ENCOUNTER — Ambulatory Visit

## 2024-03-06 ENCOUNTER — Encounter (INDEPENDENT_AMBULATORY_CARE_PROVIDER_SITE_OTHER): Payer: Medicare Other | Admitting: Ophthalmology
